# Patient Record
Sex: Male | Born: 1935 | Race: White | Hispanic: No | Smoking: Never smoker
Health system: Southern US, Community
[De-identification: ages and names within clinical notes are randomized; demographics above are authoritative.]

## PROBLEM LIST (undated history)

## (undated) DIAGNOSIS — I499 Cardiac arrhythmia, unspecified: Secondary | ICD-10-CM

## (undated) DIAGNOSIS — I6200 Nontraumatic subdural hemorrhage, unspecified: Secondary | ICD-10-CM

## (undated) DIAGNOSIS — N289 Disorder of kidney and ureter, unspecified: Secondary | ICD-10-CM

## (undated) DIAGNOSIS — E039 Hypothyroidism, unspecified: Secondary | ICD-10-CM

## (undated) DIAGNOSIS — H9190 Unspecified hearing loss, unspecified ear: Secondary | ICD-10-CM

## (undated) DIAGNOSIS — H3530 Unspecified macular degeneration: Secondary | ICD-10-CM

## (undated) DIAGNOSIS — I10 Essential (primary) hypertension: Secondary | ICD-10-CM

## (undated) HISTORY — PX: BRAIN SURGERY: SHX531

## (undated) HISTORY — PX: APPENDECTOMY: SHX54

## (undated) HISTORY — PX: CHOLECYSTECTOMY: SHX55

## (undated) HISTORY — PX: OTHER SURGICAL HISTORY: SHX169

---

## 2007-02-08 ENCOUNTER — Emergency Department: Payer: Self-pay | Admitting: Internal Medicine

## 2011-05-01 ENCOUNTER — Ambulatory Visit: Payer: Self-pay | Admitting: Urology

## 2011-05-08 ENCOUNTER — Ambulatory Visit: Payer: Self-pay | Admitting: Internal Medicine

## 2011-05-08 LAB — CREATININE, SERUM
Creatinine: 1.01 mg/dL (ref 0.60–1.30)
EGFR (African American): >60
EGFR (Non-African Amer.): >60

## 2011-05-08 LAB — CBC CANCER CENTER
Basophil %: 0.9 %
Eosinophil %: 2.8 %
HGB: 14.3 g/dL (ref 13.0–18.0)
Lymphocyte #: 1.6 x10 3/mm (ref 1.0–3.6)
Lymphocyte %: 27.9 %
MCH: 30.7 pg (ref 26.0–34.0)
MCV: 91 fL (ref 80–100)
Monocyte #: 0.4 x10 3/mm (ref 0.0–0.7)
Monocyte %: 7.4 %
RBC: 4.64 10*6/uL (ref 4.40–5.90)

## 2011-05-08 LAB — HEPATIC FUNCTION PANEL A (ARMC)
SGOT(AST): 25 U/L (ref 15–37)
SGPT (ALT): 26 U/L

## 2011-05-08 LAB — APTT: Activated PTT: 42.8 secs — ABNORMAL HIGH (ref 23.6–35.9)

## 2011-05-08 LAB — PROTIME-INR: Prothrombin Time: 24.2 secs — ABNORMAL HIGH (ref 11.5–14.7)

## 2011-05-31 ENCOUNTER — Ambulatory Visit: Payer: Self-pay | Admitting: Internal Medicine

## 2011-09-06 ENCOUNTER — Ambulatory Visit: Payer: Self-pay | Admitting: Internal Medicine

## 2011-09-06 ENCOUNTER — Ambulatory Visit: Payer: Self-pay | Admitting: Urology

## 2011-09-06 LAB — CBC CANCER CENTER
Basophil #: 0.1 x10 3/mm (ref 0.0–0.1)
Basophil %: 1.4 %
Eosinophil %: 4.3 %
HCT: 44.8 % (ref 40.0–52.0)
HGB: 14.6 g/dL (ref 13.0–18.0)
Lymphocyte #: 1.7 x10 3/mm (ref 1.0–3.6)
Lymphocyte %: 34 %
MCH: 30 pg (ref 26.0–34.0)
MCHC: 32.7 g/dL (ref 32.0–36.0)
MCV: 92 fL (ref 80–100)
Monocyte #: 0.4 x10 3/mm (ref 0.2–1.0)
Monocyte %: 8.8 %
Platelet: 176 x10 3/mm (ref 150–440)
RBC: 4.88 10*6/uL (ref 4.40–5.90)
RDW: 13.6 % (ref 11.5–14.5)

## 2011-09-06 LAB — HEPATIC FUNCTION PANEL A (ARMC)
Alkaline Phosphatase: 85 U/L (ref 50–136)
Bilirubin,Total: 0.6 mg/dL (ref 0.2–1.0)
SGOT(AST): 21 U/L (ref 15–37)
Total Protein: 6.6 g/dL (ref 6.4–8.2)

## 2011-09-06 LAB — CREATININE, SERUM
Creatinine: 1.01 mg/dL (ref 0.60–1.30)
EGFR (African American): >60

## 2011-09-30 ENCOUNTER — Ambulatory Visit: Payer: Self-pay | Admitting: Internal Medicine

## 2011-10-15 ENCOUNTER — Ambulatory Visit: Payer: Self-pay | Admitting: Urology

## 2011-10-15 LAB — BASIC METABOLIC PANEL
Anion Gap: 8 (ref 7–16)
Calcium, Total: 8.4 mg/dL — ABNORMAL LOW (ref 8.5–10.1)
Co2: 27 mmol/L (ref 21–32)
Creatinine: 0.75 mg/dL (ref 0.60–1.30)
EGFR (African American): >60
EGFR (Non-African Amer.): >60
Glucose: 100 mg/dL — ABNORMAL HIGH (ref 65–99)
Osmolality: 283 (ref 275–301)
Sodium: 141 mmol/L (ref 136–145)

## 2011-10-22 ENCOUNTER — Ambulatory Visit: Payer: Self-pay | Admitting: Urology

## 2011-10-22 LAB — PROTIME-INR: Prothrombin Time: 13.8 secs (ref 11.5–14.7)

## 2011-10-23 LAB — PATHOLOGY REPORT

## 2012-03-09 ENCOUNTER — Ambulatory Visit: Payer: Self-pay | Admitting: Urology

## 2012-03-13 ENCOUNTER — Emergency Department: Payer: Self-pay | Admitting: Emergency Medicine

## 2012-03-13 LAB — URINALYSIS, COMPLETE
Bilirubin,UR: NEGATIVE
Ketone: NEGATIVE
Ph: 6 (ref 4.5–8.0)
Protein: NEGATIVE
RBC,UR: 5 /HPF (ref 0–5)
Specific Gravity: 1.019 (ref 1.003–1.030)
WBC UR: 7 /HPF (ref 0–5)

## 2012-03-13 LAB — COMPREHENSIVE METABOLIC PANEL
Albumin: 3.6 g/dL (ref 3.4–5.0)
Anion Gap: 7 (ref 7–16)
Bilirubin,Total: 0.8 mg/dL (ref 0.2–1.0)
Calcium, Total: 8.5 mg/dL (ref 8.5–10.1)
Chloride: 104 mmol/L (ref 98–107)
Co2: 27 mmol/L (ref 21–32)
Creatinine: 1.12 mg/dL (ref 0.60–1.30)
Glucose: 137 mg/dL — ABNORMAL HIGH (ref 65–99)
Osmolality: 281 (ref 275–301)
Potassium: 4.1 mmol/L (ref 3.5–5.1)
Sodium: 138 mmol/L (ref 136–145)

## 2012-03-13 LAB — CK TOTAL AND CKMB (NOT AT ARMC)
CK, Total: 91 U/L (ref 35–232)
CK-MB: 1.8 ng/mL (ref 0.5–3.6)

## 2012-03-13 LAB — CBC
HCT: 43.2 % (ref 40.0–52.0)
HGB: 14.6 g/dL (ref 13.0–18.0)
MCH: 30.8 pg (ref 26.0–34.0)
Platelet: 196 10*3/uL (ref 150–440)
WBC: 8 10*3/uL (ref 3.8–10.6)

## 2012-03-13 LAB — DRUG SCREEN, URINE
Amphetamines, Ur Screen: NEGATIVE (ref ?–1000)
Barbiturates, Ur Screen: NEGATIVE (ref ?–200)
MDMA (Ecstasy)Ur Screen: NEGATIVE (ref ?–500)
Opiate, Ur Screen: NEGATIVE (ref ?–300)
Phencyclidine (PCP) Ur S: NEGATIVE (ref ?–25)
Tricyclic, Ur Screen: NEGATIVE (ref ?–1000)

## 2012-03-13 LAB — PROTIME-INR
INR: 3.2
Prothrombin Time: 32.5 secs — ABNORMAL HIGH (ref 11.5–14.7)

## 2012-07-01 ENCOUNTER — Ambulatory Visit: Payer: Self-pay | Admitting: Internal Medicine

## 2013-03-14 ENCOUNTER — Emergency Department: Payer: Self-pay | Admitting: Emergency Medicine

## 2013-03-14 LAB — COMPREHENSIVE METABOLIC PANEL
Albumin: 3.4 g/dL (ref 3.4–5.0)
Alkaline Phosphatase: 90 U/L
Anion Gap: 6 — ABNORMAL LOW (ref 7–16)
BUN: 21 mg/dL — ABNORMAL HIGH (ref 7–18)
Bilirubin,Total: 0.4 mg/dL (ref 0.2–1.0)
Calcium, Total: 8.5 mg/dL (ref 8.5–10.1)
Chloride: 108 mmol/L — ABNORMAL HIGH (ref 98–107)
Co2: 24 mmol/L (ref 21–32)
Creatinine: 0.95 mg/dL (ref 0.60–1.30)
EGFR (African American): >60
Glucose: 133 mg/dL — ABNORMAL HIGH (ref 65–99)
Osmolality: 281 (ref 275–301)
SGOT(AST): 22 U/L (ref 15–37)
SGPT (ALT): 28 U/L (ref 12–78)
Total Protein: 6.4 g/dL (ref 6.4–8.2)

## 2013-03-14 LAB — CBC
MCHC: 33 g/dL (ref 32.0–36.0)
RBC: 4.7 10*6/uL (ref 4.40–5.90)
RDW: 14 % (ref 11.5–14.5)
WBC: 6.2 10*3/uL (ref 3.8–10.6)

## 2013-03-14 LAB — TROPONIN I
Troponin-I: <0.02 ng/mL
Troponin-I: <0.02 ng/mL

## 2013-03-22 ENCOUNTER — Ambulatory Visit: Payer: Self-pay | Admitting: Internal Medicine

## 2014-07-19 NOTE — Op Note (Signed)
PATIENT NAME:  Jeffrey Ashley, Jeffrey MR#:  [id number] DATE OF BIRTH:  May 18, 1926  DATE OF PROCEDURE:  10/22/2011  PREOPERATIVE DIAGNOSIS: Left renal mass.   POSTOPERATIVE DIAGNOSIS: Left renal mass.   PROCEDURE: Left CT-guided percutaneous renal cryotherapy and biopsy.   SURGEON: Madolyn FriezeBrian S. Achilles Dunkope, MD  ANESTHESIA: General endotracheal anesthesia.   INDICATIONS: The patient is an 81 year old gentleman who has been found to have enlarging left renal masses. He has two masses in close proximity in the posterior aspect of the mid left kidney. The largest is approximately 2.5 cm, the second is approximately 1.5 cm in close proximity. He presents for biopsy and cryoablation.   DESCRIPTION OF PROCEDURE: After informed consent was obtained, the patient was taken to the CT suite and placed in the prone position on the CT gantry under general endotracheal anesthesia. The patient was then prepped and draped in the usual standard fashion. Initial markings were made on the overlying skin at the approximate level of the tumor. A small skin incision was made. A sheath was passed through the skin to the level of the tumor. Biopsies were then obtained utilizing 16-gauge biopsy gun. Two good core biopsies were obtained. These were sent to pathology for further evaluation. No significant bleeding was encountered. The sheath was removed. The first cryoablation 2.4 mm needle was inserted through the same site. It was advanced into the central aspect of the larger tumor. A second needle was then placed just lateral. This was advanced into the region of the second tumor. It was placed to the base of each tumor right to the collecting system fat. Freeze was then undertaken. This demonstrated good coverage of both lesions. After completion of the first freeze at 10 minutes a good cryoablation zone was appreciated. An initial scan was taken utilizing 50 mL of contrast. This demonstrated good coverage of the tumor region. The lesion was  then thawed in the standard fashion for approximately eight minutes. A second freeze was then undertaken at 10 minutes. Revisualization demonstrated good coverage of the entire region. The needles were then thawed for approximately four minutes. They were then removed. A Telfa and Tegaderm dressing was applied to the overlying skin. The patient was returned to the supine position. He was awakened from general endotracheal anesthesia. He was taken to the recovery room in stable condition. There were no problems or complications. The patient tolerated the procedure well.   ESTIMATED BLOOD LOSS: Minimal.   ____________________________ Madolyn FriezeBrian S. Achilles Dunkope, MD bsc:cms D: 10/22/2011 21:06:09 ET T: 10/23/2011 09:43:42 ET JOB#: [id number]  cc: Madolyn FriezeBrian S. Achilles Dunkope, MD, <Dictator> Madolyn FriezeBRIAN S Lari Linson MD ELECTRONICALLY SIGNED 10/25/2011 17:13

## 2016-07-07 ENCOUNTER — Emergency Department: Payer: Medicare Other

## 2016-07-07 ENCOUNTER — Encounter: Payer: Self-pay | Admitting: Emergency Medicine

## 2016-07-07 ENCOUNTER — Emergency Department
Admission: EM | Admit: 2016-07-07 | Discharge: 2016-07-07 | Disposition: A | Discharge status: Other Acute Inpatient Hospital | Payer: Medicare Other | Attending: Emergency Medicine | Admitting: Emergency Medicine

## 2016-07-07 ENCOUNTER — Ambulatory Visit (HOSPITAL_COMMUNITY)
Admission: AD | Admit: 2016-07-07 | Discharge: 2016-07-07 | Disposition: A | Discharge status: Home / Self Care | Payer: Medicare Other | Source: Other Acute Inpatient Hospital | Attending: Emergency Medicine | Admitting: Emergency Medicine

## 2016-07-07 DIAGNOSIS — R109 Unspecified abdominal pain: Secondary | ICD-10-CM

## 2016-07-07 DIAGNOSIS — Z79899 Other long term (current) drug therapy: Secondary | ICD-10-CM | POA: Insufficient documentation

## 2016-07-07 DIAGNOSIS — K8050 Calculus of bile duct without cholangitis or cholecystitis without obstruction: Secondary | ICD-10-CM

## 2016-07-07 DIAGNOSIS — R112 Nausea with vomiting, unspecified: Secondary | ICD-10-CM | POA: Insufficient documentation

## 2016-07-07 DIAGNOSIS — H538 Other visual disturbances: Secondary | ICD-10-CM | POA: Diagnosis not present

## 2016-07-07 DIAGNOSIS — I10 Essential (primary) hypertension: Secondary | ICD-10-CM | POA: Diagnosis not present

## 2016-07-07 DIAGNOSIS — Z87891 Personal history of nicotine dependence: Secondary | ICD-10-CM | POA: Insufficient documentation

## 2016-07-07 HISTORY — DX: Essential (primary) hypertension: I10

## 2016-07-07 HISTORY — DX: Unspecified macular degeneration: H35.30

## 2016-07-07 HISTORY — DX: Disorder of kidney and ureter, unspecified: N28.9

## 2016-07-07 HISTORY — DX: Nontraumatic subdural hemorrhage, unspecified: I62.00

## 2016-07-07 LAB — COMPREHENSIVE METABOLIC PANEL
ALBUMIN: 3.9 g/dL (ref 3.5–5.0)
ALK PHOS: 181 U/L — AB (ref 38–126)
ALT: 465 U/L — ABNORMAL HIGH (ref 17–63)
AST: 265 U/L — AB (ref 15–41)
Anion gap: 8 (ref 5–15)
BUN: 18 mg/dL (ref 6–20)
CALCIUM: 9.1 mg/dL (ref 8.9–10.3)
CHLORIDE: 102 mmol/L (ref 101–111)
CO2: 26 mmol/L (ref 22–32)
CREATININE: 0.74 mg/dL (ref 0.61–1.24)
GFR calc non Af Amer: >60 mL/min (ref >60)
GLUCOSE: 140 mg/dL — AB (ref 65–99)
Potassium: 4.3 mmol/L (ref 3.5–5.1)
SODIUM: 136 mmol/L (ref 135–145)
Total Bilirubin: 7.9 mg/dL — ABNORMAL HIGH (ref 0.3–1.2)
Total Protein: 6.6 g/dL (ref 6.5–8.1)

## 2016-07-07 LAB — URINALYSIS, ROUTINE W REFLEX MICROSCOPIC
BACTERIA UA: NONE SEEN
GLUCOSE, UA: NEGATIVE mg/dL
Hgb urine dipstick: NEGATIVE
KETONES UR: 20 mg/dL — AB
Leukocytes, UA: NEGATIVE
Nitrite: NEGATIVE
PROTEIN: 30 mg/dL — AB
Specific Gravity, Urine: 1.025 (ref 1.005–1.030)
pH: 5 (ref 5.0–8.0)

## 2016-07-07 LAB — CBC
HCT: 46.2 % (ref 40.0–52.0)
HEMOGLOBIN: 15.4 g/dL (ref 13.0–18.0)
MCH: 30.8 pg (ref 26.0–34.0)
MCHC: 33.3 g/dL (ref 32.0–36.0)
MCV: 92.4 fL (ref 80.0–100.0)
PLATELETS: 186 10*3/uL (ref 150–440)
RBC: 5 MIL/uL (ref 4.40–5.90)
RDW: 14.7 % — ABNORMAL HIGH (ref 11.5–14.5)
WBC: 10.1 10*3/uL (ref 3.8–10.6)

## 2016-07-07 LAB — LIPASE, BLOOD: Lipase: 2859 U/L — ABNORMAL HIGH (ref 11–51)

## 2016-07-07 MED ORDER — ONDANSETRON HCL 4 MG/2ML IJ SOLN
4.0000 mg | Freq: Once | INTRAMUSCULAR | Status: AC
  Administered 2016-07-07: 4 mg via INTRAVENOUS

## 2016-07-07 MED ORDER — MORPHINE SULFATE (PF) 2 MG/ML IV SOLN
INTRAVENOUS | Status: AC
  Administered 2016-07-07: 2 mg via INTRAVENOUS
  Filled 2016-07-07: qty 1

## 2016-07-07 MED ORDER — MORPHINE SULFATE (PF) 2 MG/ML IV SOLN
2.0000 mg | Freq: Once | INTRAVENOUS | Status: AC
  Administered 2016-07-07: 2 mg via INTRAVENOUS

## 2016-07-07 MED ORDER — DILTIAZEM HCL 25 MG/5ML IV SOLN
5.0000 mg | Freq: Once | INTRAVENOUS | Status: AC
  Administered 2016-07-07: 5 mg via INTRAVENOUS
  Filled 2016-07-07: qty 5

## 2016-07-07 MED ORDER — SODIUM CHLORIDE 0.9 % IV BOLUS (SEPSIS)
1000.0000 mL | Freq: Once | INTRAVENOUS | Status: AC
  Administered 2016-07-07: 1000 mL via INTRAVENOUS

## 2016-07-07 MED ORDER — MORPHINE SULFATE (PF) 2 MG/ML IV SOLN
2.0000 mg | Freq: Once | INTRAVENOUS | Status: AC
  Administered 2016-07-07: 2 mg via INTRAVENOUS
  Filled 2016-07-07: qty 1

## 2016-07-07 MED ORDER — ONDANSETRON HCL 4 MG/2ML IJ SOLN
4.0000 mg | Freq: Once | INTRAMUSCULAR | Status: AC
  Administered 2016-07-07: 4 mg via INTRAVENOUS
  Filled 2016-07-07: qty 2

## 2016-07-07 MED ORDER — ONDANSETRON HCL 4 MG/2ML IJ SOLN
INTRAMUSCULAR | Status: AC
  Administered 2016-07-07: 4 mg via INTRAVENOUS
  Filled 2016-07-07: qty 2

## 2016-07-07 NOTE — ED Provider Notes (Signed)
Vitals:   07/07/16 1800 07/07/16 1943  BP: (!) 159/104 (!) 177/113  Pulse: (!) 112 (!) 105  Resp: 16 18  Temp:      Patient resting comfortably in no distress. Awaiting ALS transfer to [facility] for concerns of choledocholithiasis with associated acute pancreatitis.   Sharyn Creamer, MD 07/07/16 2123

## 2016-07-07 NOTE — ED Notes (Signed)
Report given to [facility], RN with [facility] for transfer.

## 2016-07-07 NOTE — ED Provider Notes (Signed)
Patient heart rate irregular, presently 105-120. Reports he has a history of A. fib and has had use diltiazem for this in the past. He is currently nothing by mouth. Also reports mild recurrence of pain in the epigastrium, and nausea. We'll give morphine, small dose diltiazem for rate control. Rechecking with [facility] to see if transport will be available soon as [employer] overwhelmed present time unable provide a transport. Patient and family updated.   Sharyn Creamer, MD 07/07/16 2202

## 2016-07-07 NOTE — ED Notes (Signed)
The EKG was completed, signed by the Dr., and exported into the system.

## 2016-07-07 NOTE — ED Provider Notes (Signed)
[facility] Emergency Department Provider Note  Time seen: 1:33 PM  I have reviewed the triage vital signs and the nursing notes.   HISTORY  Chief Complaint Blurred Vision; Dizziness; Nausea; Emesis; and Diarrhea    HPI Jeffrey Ashley is a 83 y.o. male with a past medical history macular degeneration, hypertension, presents to the emergency department with dizziness nausea vomiting and diarrhea with abdominal discomfort.According to the patient for the past 4 days he has been having some vague abdominal discomfort which she describes as a nausea feeling in his abdomen. Patient states yesterday he had a few episodes of vomiting. Over the course of the past 2 or 3 days he has had several episodes of diarrhea. Patient states he has been feeling dizzy and lightheaded over the past several days as well. Denies any fever, chest pain, shortness of breath, cough or congestion. Denies dysuria.  Past Medical History:  Diagnosis Date  . 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 Hypertension   . Nontraumatic subdural hematoma (HCC)   . 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   Past Surgical History:  Procedure Laterality Date  . APPENDECTOMY    . Kidney Tumor Removal      Prior to Admission medications   Medication Sig Start Date End Date Taking? Authorizing Provider  acetaminophen (TYLENOL) 500 MG tablet Take 500 mg by mouth every 6 (six) hours as needed.   Yes Historical Provider, MD  buPROPion (WELLBUTRIN XL) 300 MG 24 hr tablet Take 300 mg by mouth daily.   Yes Historical Provider, MD  carvedilol (COREG) 6.25 MG tablet Take 6.25 mg by mouth 2 (two) times daily. 06/13/16  Yes Historical Provider, MD  cyanocobalamin 1000 MCG tablet Take 1,000 mcg by mouth daily.   Yes Historical Provider, MD  diltiazem (CARDIZEM CD) 120 MG 24 hr capsule Take 120 mg by mouth daily. 06/17/16  Yes Historical Provider, MD   gabapentin (NEURONTIN) 300 MG capsule Take 300 mg by mouth daily. 06/20/16  Yes Historical Provider, MD  latanoprost (XALATAN) 0.005 % ophthalmic solution Apply 1 drop to eye at bedtime.   Yes Historical Provider, MD  levothyroxine (SYNTHROID, LEVOTHROID) 75 MCG tablet Take 75 mcg by mouth daily. 07/01/16  Yes Historical Provider, MD  Vitamin D, Ergocalciferol, (DRISDOL) 50000 units CAPS capsule Take 50,000 Units by mouth every 7 (seven) days.   Yes Historical Provider, MD    No Known Allergies  No family history on file.  Social History Social History  Substance Use Topics  . Smoking status: Former Games developer  . Smokeless tobacco: Never Used  . Alcohol use No    Review of Systems Constitutional: Negative for fever. Cardiovascular: Negative for chest pain. Respiratory: Negative for shortness of breath. Gastrointestinal: Mild abdominal discomfort. Positive for nausea and vomiting. Positive for diarrhea Genitourinary: Negative for dysuria Neurological: Negative for headache 10-point ROS otherwise negative.  ____________________________________________   PHYSICAL EXAM:  VITAL SIGNS: ED Triage Vitals  Enc Vitals Group     BP 07/07/16 1230 (!) 171/100     Pulse Rate 07/07/16 1230 (!) 106     Resp 07/07/16 1230 20     Temp 07/07/16 1230 97.2 F (36.2 C)     Temp Source 07/07/16 1230 Oral     SpO2 07/07/16 1230 100 %     Weight 07/07/16 1225 186 lb (84.4 kg)     Height 07/07/16  1225 6' (1.829 m)     Head Circumference --      Peak Flow --      Pain Score 07/07/16 1225 6     Pain Loc --      Pain Edu? --      Excl. in GC? --     Constitutional: Alert and oriented. Well appearing and in no distress. Eyes: Normal exam ENT   Head: Normocephalic and atraumatic.   Mouth/Throat: Mucous membranes are moist. Cardiovascular: Normal rate, regular rhythm.  Respiratory: Normal respiratory effort without tachypnea nor retractions. Breath sounds are clear  Gastrointestinal:  Soft, minimal diffuse tenderness to palpation without rebound or guarding. No distention. Tympanic of her abdominal percussion with dull lower abdominal percussion. Musculoskeletal: Nontender with normal range of motion in all extremities.  Neurologic:  Normal speech and language. No gross focal neurologic deficits Skin:  Skin is warm, dry and intact.  Psychiatric: Mood and affect are normal.  ____________________________________________    EKG  EKG reviewed and interpreted vomit so shows atrial fibrillation 98 bpm, narrow QRS, normal axis, normal intervals, nonspecific ST changes without ST elevation.  ____________________________________________    RADIOLOGY  X-ray negative  ____________________________________________   INITIAL IMPRESSION / ASSESSMENT AND PLAN / ED COURSE  Pertinent labs & imaging results that were available during my care of the patient were reviewed by me and considered in my medical decision making (see chart for details).  Patient presents to the emergency department with vague symptoms over the past 3 or 4 days of nausea, some dizziness occasionally episodes of vomiting and diarrhea. Overall the patient appears well currently. Speaks well, oriented. States it vague abdominal discomfort which she describes as a nausea sensation. He has been having vomiting and diarrhea and feels lightheaded/dizzy. Patient does have chronic atrial fibrillation not on a blood thinner due to fall risk and a prior subdural hematoma. Patient states 2-3 weeks ago he appeared to suffer a mini stroke in which she could not use his left leg temporarily. States later that day off his symptoms resolved and have not recurred since. I discussed the possibility of a CVA causing today's symptoms, he understands the risks but does not want a large workup. We will check labs, urine, IV hydrate and closely monitor.  Patient's labs show significant elevation of lipase 2800 as well as significantly  elevated LFTs with a total bilirubin of 7.9. Labs are concerning for possible choledocholithiasis.  I discussed the patient with GI medicine. Does not have the ability to do an ERCP over the weekend and given the abdominal pain they recommend transfer to a tertiary center. I discussed this with the patient and family as well as the patient's son over the phone. They would highly prefer transfer to [facility]. We will discuss with [facility] to arrange the transfer.  Patient except at [facility] for further treatment.  ____________________________________________   FINAL CLINICAL IMPRESSION(S) / ED DIAGNOSES  Abdominal pain Nausea vomiting Choledocholithiasis   Minna Antis, MD 07/08/16 2329

## 2016-07-07 NOTE — ED Triage Notes (Signed)
Pt presents to ED via POV with his niece. Per niece pt is a resident at [facility], pt was seen today at [facility] for N/V/D and possible dehydration, pt was given 1L of fluids at [facility]. Pt states that N/V/D started on Thursday, c/o dizziness and blurred vision since Friday. Pt's niece reports approx 2 weeks ago had TIA like symptoms but was not evaluated for it. Pt is alert and oriented at this time.

## 2016-07-16 DIAGNOSIS — G629 Polyneuropathy, unspecified: Secondary | ICD-10-CM

## 2016-07-16 DIAGNOSIS — K861 Other chronic pancreatitis: Secondary | ICD-10-CM | POA: Diagnosis not present

## 2016-07-16 DIAGNOSIS — I10 Essential (primary) hypertension: Secondary | ICD-10-CM

## 2016-07-16 DIAGNOSIS — I480 Paroxysmal atrial fibrillation: Secondary | ICD-10-CM | POA: Diagnosis not present

## 2016-07-16 DIAGNOSIS — K260 Acute duodenal ulcer with hemorrhage: Secondary | ICD-10-CM

## 2016-07-16 DIAGNOSIS — K851 Biliary acute pancreatitis without necrosis or infection: Secondary | ICD-10-CM | POA: Diagnosis not present

## 2016-10-22 ENCOUNTER — Other Ambulatory Visit: Payer: Self-pay | Admitting: Internal Medicine

## 2016-10-22 DIAGNOSIS — R1031 Right lower quadrant pain: Secondary | ICD-10-CM

## 2016-10-24 ENCOUNTER — Ambulatory Visit
Admission: RE | Admit: 2016-10-24 | Discharge: 2016-10-24 | Disposition: A | Discharge status: Home / Self Care | Payer: Medicare Other | Source: Ambulatory Visit | Attending: Internal Medicine | Admitting: Internal Medicine

## 2016-10-24 ENCOUNTER — Other Ambulatory Visit: Payer: Self-pay | Admitting: Internal Medicine

## 2016-10-24 DIAGNOSIS — R319 Hematuria, unspecified: Secondary | ICD-10-CM | POA: Diagnosis not present

## 2016-10-24 DIAGNOSIS — Z9049 Acquired absence of other specified parts of digestive tract: Secondary | ICD-10-CM | POA: Insufficient documentation

## 2016-10-24 DIAGNOSIS — R1011 Right upper quadrant pain: Secondary | ICD-10-CM | POA: Insufficient documentation

## 2016-10-24 DIAGNOSIS — R1031 Right lower quadrant pain: Secondary | ICD-10-CM

## 2016-10-31 ENCOUNTER — Other Ambulatory Visit: Payer: Self-pay | Admitting: Internal Medicine

## 2016-10-31 DIAGNOSIS — N2889 Other specified disorders of kidney and ureter: Secondary | ICD-10-CM

## 2016-11-04 ENCOUNTER — Other Ambulatory Visit: Payer: Self-pay | Admitting: Internal Medicine

## 2016-11-04 DIAGNOSIS — N2889 Other specified disorders of kidney and ureter: Secondary | ICD-10-CM

## 2016-11-13 ENCOUNTER — Ambulatory Visit
Admission: RE | Admit: 2016-11-13 | Discharge: 2016-11-13 | Disposition: A | Discharge status: Home / Self Care | Payer: Medicare Other | Source: Ambulatory Visit | Attending: Internal Medicine | Admitting: Internal Medicine

## 2016-11-13 DIAGNOSIS — N2889 Other specified disorders of kidney and ureter: Secondary | ICD-10-CM | POA: Insufficient documentation

## 2016-11-13 LAB — POCT I-STAT CREATININE: Creatinine, Ser: 0.9 mg/dL (ref 0.61–1.24)

## 2016-11-13 MED ORDER — GADOBENATE DIMEGLUMINE 529 MG/ML IV SOLN
17.0000 mL | Freq: Once | INTRAVENOUS | Status: AC | PRN
  Administered 2016-11-13: 17 mL via INTRAVENOUS

## 2017-02-25 ENCOUNTER — Encounter: Payer: Self-pay | Admitting: *Deleted

## 2017-03-06 ENCOUNTER — Encounter: Payer: Self-pay | Admitting: *Deleted

## 2017-03-06 ENCOUNTER — Other Ambulatory Visit: Payer: Self-pay

## 2017-03-06 ENCOUNTER — Ambulatory Visit: Payer: Medicare Other | Admitting: Anesthesiology

## 2017-03-06 ENCOUNTER — Encounter
Admission: RE | Disposition: A | Discharge status: Home / Self Care | Payer: Self-pay | Source: Ambulatory Visit | Attending: Ophthalmology

## 2017-03-06 ENCOUNTER — Ambulatory Visit
Admission: RE | Admit: 2017-03-06 | Discharge: 2017-03-06 | Disposition: A | Discharge status: Home / Self Care | Payer: Medicare Other | Source: Ambulatory Visit | Attending: Ophthalmology | Admitting: Ophthalmology

## 2017-03-06 DIAGNOSIS — Z9049 Acquired absence of other specified parts of digestive tract: Secondary | ICD-10-CM | POA: Diagnosis not present

## 2017-03-06 DIAGNOSIS — I4891 Unspecified atrial fibrillation: Secondary | ICD-10-CM | POA: Diagnosis not present

## 2017-03-06 DIAGNOSIS — I10 Essential (primary) hypertension: Secondary | ICD-10-CM | POA: Diagnosis not present

## 2017-03-06 DIAGNOSIS — Z87891 Personal history of nicotine dependence: Secondary | ICD-10-CM | POA: Diagnosis not present

## 2017-03-06 DIAGNOSIS — Z9889 Other specified postprocedural states: Secondary | ICD-10-CM | POA: Diagnosis not present

## 2017-03-06 DIAGNOSIS — Z888 Allergy status to other drugs, medicaments and biological substances status: Secondary | ICD-10-CM | POA: Diagnosis not present

## 2017-03-06 DIAGNOSIS — H2512 Age-related nuclear cataract, left eye: Secondary | ICD-10-CM | POA: Insufficient documentation

## 2017-03-06 DIAGNOSIS — H9190 Unspecified hearing loss, unspecified ear: Secondary | ICD-10-CM | POA: Insufficient documentation

## 2017-03-06 HISTORY — PX: CATARACT EXTRACTION W/PHACO: SHX586

## 2017-03-06 HISTORY — DX: Hypothyroidism, unspecified: E03.9

## 2017-03-06 HISTORY — DX: Unspecified hearing loss, unspecified ear: H91.90

## 2017-03-06 HISTORY — DX: Cardiac arrhythmia, unspecified: I49.9

## 2017-03-06 SURGERY — PHACOEMULSIFICATION, CATARACT, WITH IOL INSERTION
Anesthesia: Monitor Anesthesia Care | Site: Eye | Laterality: Left | Wound class: Clean

## 2017-03-06 MED ORDER — CARBACHOL 0.01 % IO SOLN
INTRAOCULAR | Status: DC | PRN
  Administered 2017-03-06: 0.5 mL via INTRAOCULAR

## 2017-03-06 MED ORDER — LIDOCAINE HCL (PF) 4 % IJ SOLN
INTRAOCULAR | Status: DC | PRN
  Administered 2017-03-06: 4 mL via OPHTHALMIC

## 2017-03-06 MED ORDER — BSS IO SOLN
INTRAOCULAR | Status: DC | PRN
  Administered 2017-03-06: 09:00:00 via OPHTHALMIC

## 2017-03-06 MED ORDER — POVIDONE-IODINE 5 % OP SOLN
OPHTHALMIC | Status: AC
  Filled 2017-03-06: qty 30

## 2017-03-06 MED ORDER — PROPOFOL 10 MG/ML IV BOLUS
INTRAVENOUS | Status: DC | PRN
  Administered 2017-03-06: 20 mg via INTRAVENOUS

## 2017-03-06 MED ORDER — NEOMYCIN-POLYMYXIN-DEXAMETH 3.5-10000-0.1 OP OINT
TOPICAL_OINTMENT | OPHTHALMIC | Status: DC | PRN
  Administered 2017-03-06: 1 via OPHTHALMIC

## 2017-03-06 MED ORDER — HYALURONIDASE HUMAN 150 UNIT/ML IJ SOLN
INTRAMUSCULAR | Status: AC
  Filled 2017-03-06: qty 1

## 2017-03-06 MED ORDER — SODIUM CHLORIDE 0.9 % IV SOLN
INTRAVENOUS | Status: DC | PRN
  Administered 2017-03-06: 09:00:00 via INTRAVENOUS

## 2017-03-06 MED ORDER — MOXIFLOXACIN HCL 0.5 % OP SOLN
OPHTHALMIC | Status: DC | PRN
  Administered 2017-03-06: 0.2 mL via OPHTHALMIC

## 2017-03-06 MED ORDER — ARMC OPHTHALMIC DILATING DROPS
OPHTHALMIC | Status: AC
  Filled 2017-03-06: qty 0.4

## 2017-03-06 MED ORDER — LIDOCAINE HCL (PF) 4 % IJ SOLN
INTRAMUSCULAR | Status: DC | PRN
  Administered 2017-03-06: 09:00:00

## 2017-03-06 MED ORDER — MOXIFLOXACIN HCL 0.5 % OP SOLN: 1.0000 [drp] | OPHTHALMIC | Status: DC | PRN

## 2017-03-06 MED ORDER — TRYPAN BLUE 0.06 % OP SOLN
OPHTHALMIC | Status: DC | PRN
  Administered 2017-03-06: 0.5 mL via INTRAOCULAR

## 2017-03-06 MED ORDER — POVIDONE-IODINE 5 % OP SOLN
OPHTHALMIC | Status: DC | PRN
  Administered 2017-03-06: 1 via OPHTHALMIC

## 2017-03-06 MED ORDER — FENTANYL CITRATE (PF) 100 MCG/2ML IJ SOLN
INTRAMUSCULAR | Status: AC
  Filled 2017-03-06: qty 2

## 2017-03-06 MED ORDER — SODIUM HYALURONATE 23 MG/ML IO SOLN
INTRAOCULAR | Status: DC | PRN
Start: 2017-03-06 — End: 2017-03-06
  Administered 2017-03-06: 0.6 mL via INTRAOCULAR

## 2017-03-06 MED ORDER — NEOMYCIN-POLYMYXIN-DEXAMETH 3.5-10000-0.1 OP OINT
TOPICAL_OINTMENT | OPHTHALMIC | Status: AC
  Filled 2017-03-06: qty 3.5

## 2017-03-06 MED ORDER — BUPIVACAINE HCL (PF) 0.75 % IJ SOLN
INTRAMUSCULAR | Status: AC
  Filled 2017-03-06: qty 10

## 2017-03-06 MED ORDER — LIDOCAINE HCL (PF) 4 % IJ SOLN
INTRAMUSCULAR | Status: AC
  Filled 2017-03-06: qty 5

## 2017-03-06 MED ORDER — SODIUM HYALURONATE 10 MG/ML IO SOLN
INTRAOCULAR | Status: DC | PRN
  Administered 2017-03-06: 0.55 mL via INTRAOCULAR

## 2017-03-06 MED ORDER — SODIUM HYALURONATE 23 MG/ML IO SOLN
INTRAOCULAR | Status: AC
Start: 2017-03-06 — End: ?
  Filled 2017-03-06: qty 0.6

## 2017-03-06 MED ORDER — ARMC OPHTHALMIC DILATING DROPS
1.0000 "application " | OPHTHALMIC | Status: AC
  Administered 2017-03-06 (×2): 1 via OPHTHALMIC

## 2017-03-06 MED ORDER — SODIUM CHLORIDE 0.9 % IV SOLN: INTRAVENOUS | Status: DC

## 2017-03-06 MED ORDER — FENTANYL CITRATE (PF) 100 MCG/2ML IJ SOLN
INTRAMUSCULAR | Status: DC | PRN
  Administered 2017-03-06: 25 ug via INTRAVENOUS

## 2017-03-06 MED ORDER — MOXIFLOXACIN HCL 0.5 % OP SOLN
OPHTHALMIC | Status: AC
  Filled 2017-03-06: qty 3

## 2017-03-06 MED ORDER — EPINEPHRINE PF 1 MG/ML IJ SOLN
INTRAMUSCULAR | Status: AC
  Filled 2017-03-06: qty 2

## 2017-03-06 SURGICAL SUPPLY — 20 items
BANDAGE EYE OVAL (MISCELLANEOUS) ×6 IMPLANT
CANNULA ANT/CHMB 27GA (MISCELLANEOUS) ×3 IMPLANT
DISSECTOR HYDRO NUCLEUS 50X22 (MISCELLANEOUS) ×3 IMPLANT
FILTER MILLEX .045 (MISCELLANEOUS) ×1 IMPLANT
FLTR MILLEX .045 (MISCELLANEOUS) ×3
GLOVE BIO SURGEON STRL SZ8 (GLOVE) ×3 IMPLANT
GLOVE BIOGEL M 6.5 STRL (GLOVE) ×3 IMPLANT
GLOVE SURG LX 7.5 STRW (GLOVE) ×2
GLOVE SURG LX STRL 7.5 STRW (GLOVE) ×1 IMPLANT
GOWN STRL REUS W/ TWL LRG LVL3 (GOWN DISPOSABLE) ×2 IMPLANT
GOWN STRL REUS W/TWL LRG LVL3 (GOWN DISPOSABLE) ×4
LABEL CATARACT MEDS ST (LABEL) ×3 IMPLANT
LENS IOL TECNIS ITEC 11.5 (Intraocular Lens) ×3 IMPLANT
PACK CATARACT (MISCELLANEOUS) ×3 IMPLANT
PACK CATARACT KING (MISCELLANEOUS) ×3 IMPLANT
PACK EYE AFTER SURG (MISCELLANEOUS) ×3 IMPLANT
SOL BSS BAG (MISCELLANEOUS) ×3
SOLUTION BSS BAG (MISCELLANEOUS) ×1 IMPLANT
WATER STERILE IRR 250ML POUR (IV SOLUTION) ×3 IMPLANT
WIPE NON LINTING 3.25X3.25 (MISCELLANEOUS) ×3 IMPLANT

## 2017-03-06 NOTE — Anesthesia Postprocedure Evaluation (Signed)
Anesthesia Post Note  Patient: Jeffrey Ashley  Procedure(s) Performed: CATARACT EXTRACTION PHACO AND INTRAOCULAR LENS PLACEMENT (IOC) (Left Eye)  Patient location during evaluation: Short Stay Anesthesia Type: MAC Level of consciousness: awake and awake and alert Pain management: pain level controlled Vital Signs Assessment: post-procedure vital signs reviewed and stable Respiratory status: spontaneous breathing Cardiovascular status: blood pressure returned to baseline and stable Postop Assessment: no headache Anesthetic complications: no     Last Vitals:  Vitals:   03/06/17 0724  BP: 136/66  Pulse: (!) 47  Resp: 16  Temp: (!) 36.4 C  SpO2: 99%    Last Pain:  Vitals:   03/06/17 0724  TempSrc: Oral                 Rodney Yera,  Pebble Botkin G     

## 2017-03-06 NOTE — Anesthesia Preprocedure Evaluation (Signed)
Anesthesia Evaluation  Patient identified by MRN, date of birth, ID band Patient awake    Reviewed: Allergy & Precautions, NPO status , Patient's Chart, lab work & pertinent test results  History of Anesthesia Complications Negative for: history of anesthetic complications  Airway Mallampati: II  TM Distance: >3 FB Neck ROM: Full    Dental no notable dental hx.    Pulmonary neg sleep apnea, neg COPD, former smoker,    breath sounds clear to auscultation- rhonchi (-) wheezing      Cardiovascular hypertension, Pt. 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 No date: Dysrhythmia     Comment:  AFIB No date: HOH (hard of hearing) No date: Hypertension No date: Hypothyroidism No date: Nontraumatic subdural hematoma (HCC) No date: Tumor     Comment:  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   Reproductive/Obstetrics                             Anesthesia Physical Anesthesia Plan  ASA: III  Anesthesia Plan: MAC   Post-op Pain Management:    Induction: Intravenous  PONV Risk Score and Plan: 1 and Midazolam  Airway Management Planned: Natural Airway  Additional Equipment:   Intra-op Plan:   Post-operative Plan:   Informed Consent: I have reviewed the patients History and Physical, chart, labs and discussed the procedure including the risks, benefits and alternatives for the  proposed anesthesia with the patient or authorized representative who has indicated his/her understanding and acceptance.     Plan Discussed with: CRNA and Anesthesiologist  Anesthesia Plan Comments:         Anesthesia Quick Evaluation

## 2017-03-06 NOTE — Anesthesia Postprocedure Evaluation (Signed)
Anesthesia Post Note  Patient: Jeffrey Ashley  Procedure(s) Performed: CATARACT EXTRACTION PHACO AND INTRAOCULAR LENS PLACEMENT ([place]) (Left Eye)  Patient location during evaluation: Short Stay Anesthesia Type: MAC Level of consciousness: awake and awake and alert Pain management: pain level controlled Vital Signs Assessment: post-procedure vital signs reviewed and stable Respiratory status: spontaneous breathing Cardiovascular status: blood pressure returned to baseline and stable Postop Assessment: no headache Anesthetic complications: no     Last Vitals:  Vitals:   03/06/17 0724  BP: 136/66  Pulse: (!) 47  Resp: 16  Temp: (!) 36.4 C  SpO2: 99%    Last Pain:  Vitals:   03/06/17 0724  TempSrc: Oral                 Buckner Malta

## 2017-03-06 NOTE — Op Note (Signed)
OPERATIVE NOTE  Jeffrey SchleinRobert Johnson Jr [id number] 03/06/2017   PREOPERATIVE DIAGNOSIS:  Nuclear sclerotic cataract left eye.  H25.12   POSTOPERATIVE DIAGNOSIS:    Nuclear sclerotic cataract left eye.     PROCEDURE:  Phacoemusification with posterior chamber intraocular lens placement of the left eye   LENS:   Implant Name Type Inv. Item Serial No. Manufacturer Lot No. LRB No. Used  LENS IOL DIOP 11.5 - [id number] Intraocular Lens LENS IOL DIOP 11.5 [id number] AMO  Left 1       PCB00 +11.5   ULTRASOUND TIME: 1 minutes 10 seconds.  CDE 13.39   SURGEON:  Willey BladeBradley Dailen Mcclish, MD, MPH   ANESTHESIA:  Retrobulbar block  augmented with 1% preservative-free intracameral lidocaine.  ESTIMATED BLOOD LOSS: <1 mL   COMPLICATIONS:  None.   DESCRIPTION OF PROCEDURE:  The patient was identified in the holding room and transported to the operating room and placed in the supine position under the operating microscope.   A retrobulbar block was administered in the standard fashion.  The left eye was identified as the operative eye and it was prepped and draped in the usual sterile ophthalmic fashion.   A 1.0 millimeter clear-corneal paracentesis was made at the 5:00 position. 0.5 ml of preservative-free 1% lidocaine with epinephrine was injected into the anterior chamber.  The anterior chamber was filled with Healon 5 viscoelastic.  A 2.4 millimeter keratome was used to make a near-clear corneal incision at the 2:00 position.    There was concern for a posterior capsular defect in the preoperative evaluation, so Trypan blue was instilled under an air bubble and rinsed out with BSS to stain the capsule for improved visualization.  A curvilinear capsulorrhexis was made with a cystotome and capsulorrhexis forceps.  Balanced salt solution was used to hydrodissect and hydrodelineate the nucleus.   Phacoemulsification was then used in stop and chop fashion to remove the lens nucleus and epinucleus.  The  remaining cortex was then removed using the irrigation and aspiration handpiece. Healon was then placed into the capsular bag to distend it for lens placement.  A lens was then injected into the capsular bag.  The remaining viscoelastic was aspirated.   Wounds were hydrated with balanced salt solution.  The anterior chamber was inflated to a physiologic pressure with balanced salt solution.  Intracameral vigamox 0.1 mL undiltued was injected into the eye and a drop placed onto the ocular surface.  No wound leaks were noted.   Maxitrol ointment, a patch and shield were placed on the left eye.  The patient was taken to the recovery room in stable condition without complications of anesthesia or surgery  Willey BladeBradley Alfreda Hammad 03/06/2017, 9:40 AM

## 2017-03-06 NOTE — Anesthesia Post-op Follow-up Note (Signed)
Anesthesia QCDR form completed.        

## 2017-03-06 NOTE — Transfer of Care (Signed)
Immediate Anesthesia Transfer of Care Note  Patient: Jeffrey Ashley  Procedure(s) Performed: CATARACT EXTRACTION PHACO AND INTRAOCULAR LENS PLACEMENT (IOC) (Left Eye)  Patient Location: PACU  Anesthesia Type:MAC  Level of Consciousness: awake  Airway & Oxygen Therapy: Patient Spontanous Breathing  Post-op Assessment: Report given to RN and Post -op Vital signs reviewed and stable  Post vital signs: Reviewed and stable  Last Vitals:  Vitals:   03/06/17 0724  BP: 136/66  Pulse: (!) 47  Resp: 16  Temp: (!) 36.4 C  SpO2: 99%    Last Pain:  Vitals:   03/06/17 0724  TempSrc: Oral         Complications: No apparent anesthesia complications

## 2017-03-06 NOTE — H&P (Signed)
The History and Physical notes are on paper, have been signed, and are to be scanned.   I have examined the patient and there are no changes to the H&P.   Willey BladeBradley Rayla Pember 03/06/2017 8:54 AM

## 2017-03-06 NOTE — Discharge Instructions (Signed)
Eye Surgery Discharge Instructions  Expect mild scratchy sensation or mild soreness. DO NOT RUB YOUR EYE!  The day of surgery:  Minimal physical activity, but bed rest is not required  No reading, computer work, or close hand work  No bending, lifting, or straining.  May watch TV  For 24 hours:  No driving, legal decisions, or alcoholic beverages  Safety precautions  Eat anything you prefer: It is better to start with liquids, then soup then solid foods.  _____ Eye patch should be worn until postoperative exam tomorrow.  ____ Solar shield eyeglasses should be worn for comfort in the sunlight/patch while sleeping  Resume all regular medications including aspirin or Coumadin if these were discontinued prior to surgery. You may shower, bathe, shave, or wash your hair. Tylenol may be taken for mild discomfort.  Call your doctor if you experience significant pain, nausea, or vomiting, fever > 101 or other signs of infection. [phone] or [phone] Specific instructions:  Follow-up Information    [place] CraneKing, Bradley Mark, MD Follow up.   Specialty:  Ophthalmology Why:  December 7 at 10:10 [facility] Contact information: [address] [place] [address] [phone]

## 2018-12-01 DEATH — deceased

## 2018-12-10 IMAGING — MR MR ABDOMEN WO/W CM
16 of 17 series · 43 of 48 positions shown · IV contrast (17ML MULTIHANCE)
Comparison: Ultrasound exam 10/24/2016.  CT scan 03/09/2012.

CLINICAL DATA: Renal mass.

EXAM:
MRI ABDOMEN WITHOUT AND WITH CONTRAST
TECHNIQUE: Multiplanar multisequence MR imaging of the abdomen was performed
both before and after the administration of intravenous contrast.
CONTRAST:  17mL MULTIHANCE GADOBENATE DIMEGLUMINE 529 MG/ML IV SOLN

[Series 2: cor ssfse / · coronal · 7.0mm · 1.48mm/px · 2 of 30 slices shown]
[im 1/30]
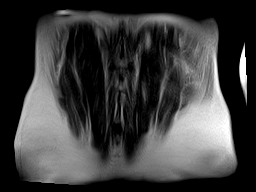
[im 30/30]
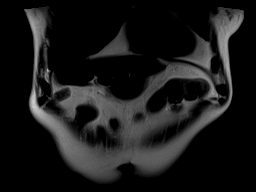

[Series 3: T2 · axial · 6.0mm · 1.48mm/px · z∈[-87,+122]mm · 2 of 30 slices shown]
[im 1/30]
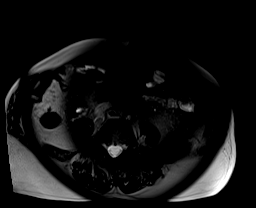
[im 30/30]
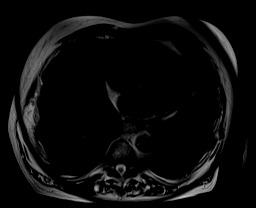

[Series 4: T1 · axial · 6.0mm · 0.74mm/px · z∈[-87,+122]mm · 4 of 60 slices shown]
[im 1/60]
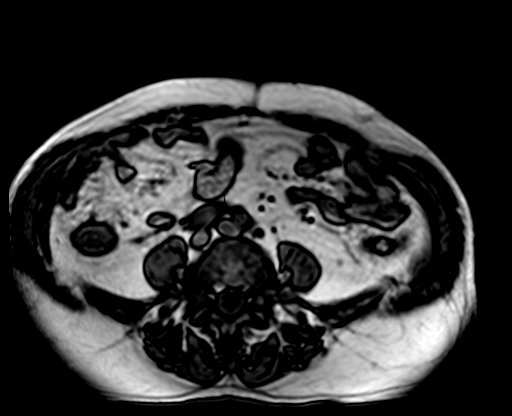
[im 20/60]
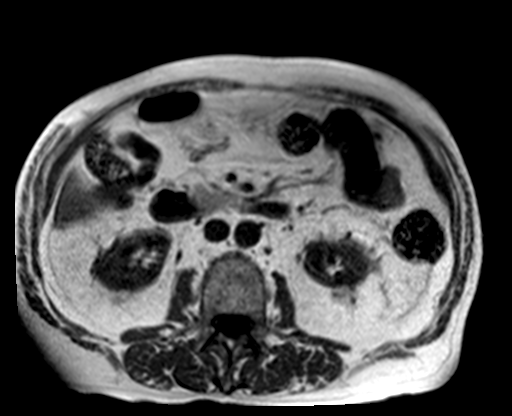
[im 40/60]
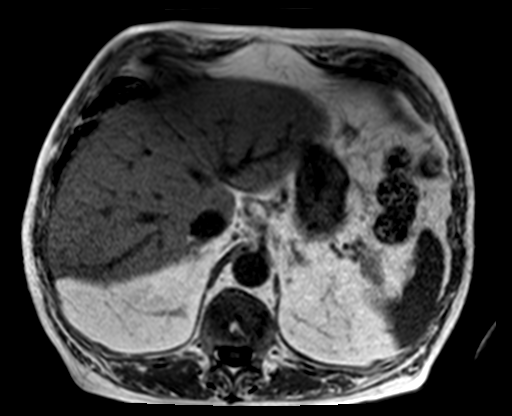
[im 60/60]
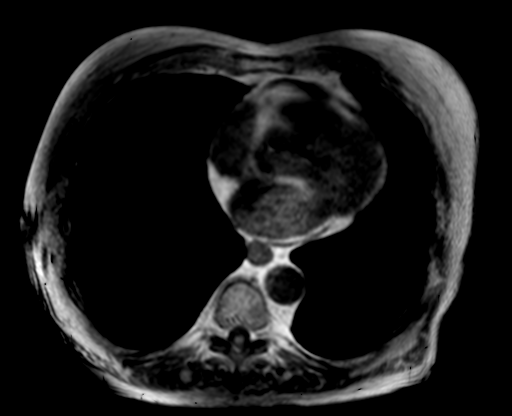

[Series 5: bSSFP · axial · 6.0mm · 0.74mm/px · z∈[-87,+122]mm · 2 of 30 slices shown]
[im 1/30]
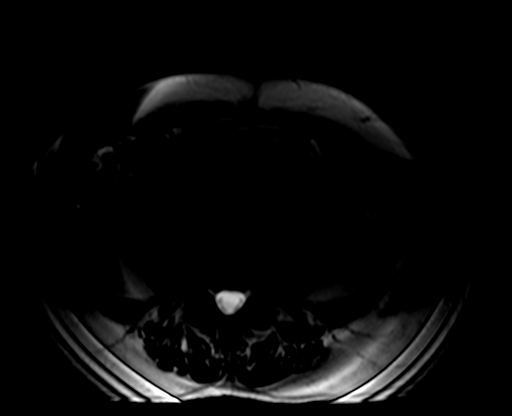
[im 30/30]
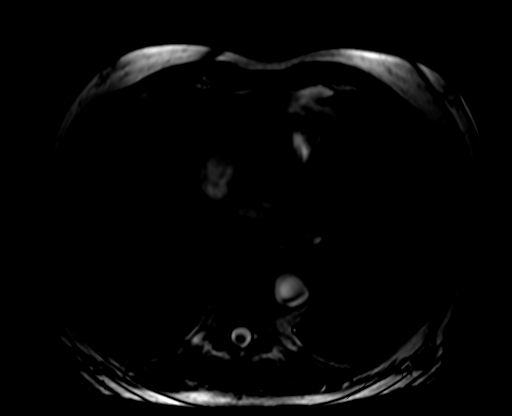

[Series 6: axial dynamic pre · axial · non-contrast · 4.0mm · 1.19mm/px · z∈[-109,+143]mm · 3 of 64 slices shown]
[im 1/64]
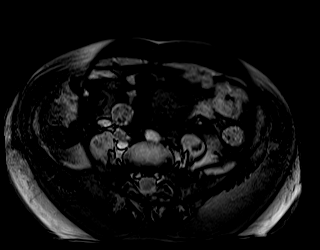
[im 32/64]
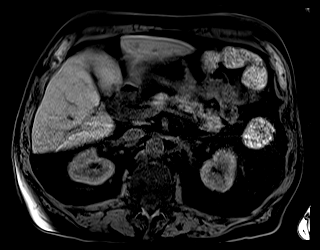
[im 64/64]
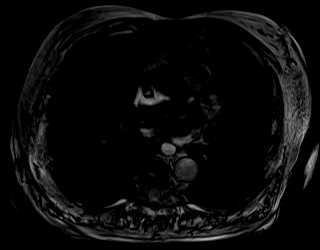

[Series 7: DWI · axial · 6.0mm · 2.00mm/px · z∈[-87,+122]mm · 4 of 90 slices shown (1 of 2)]
[im 1/90]
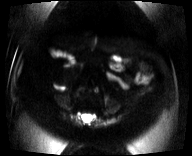
[im 30/90]
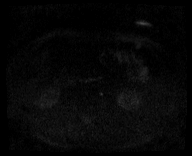
[im 60/90]
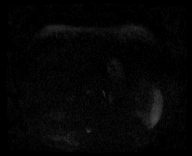
[im 90/90]
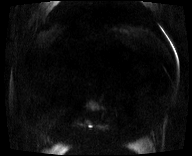

[Series 8: DWI · axial · 6.0mm · 2.00mm/px · 1 of 30 slices shown (2 of 2)]
[im 1/30]
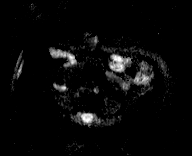

[Series 9: axial dynamic post · axial · 4.0mm · 1.19mm/px · z∈[-109,+143]mm · 3 of 64 slices shown (1 of 6)]
[im 1/64]
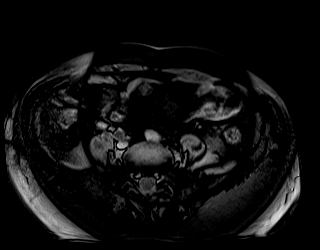
[im 32/64]
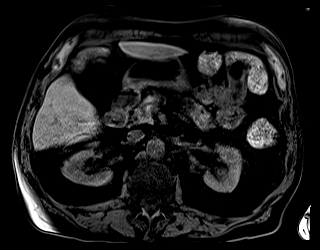
[im 64/64]
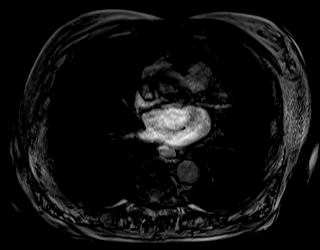

[Series 10: axial dynamic post · axial · 4.0mm · 1.19mm/px · z∈[-109,+143]mm · 3 of 64 slices shown (2 of 6)]
[im 1/64]
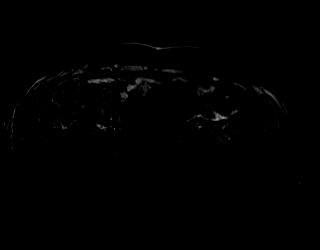
[im 32/64]
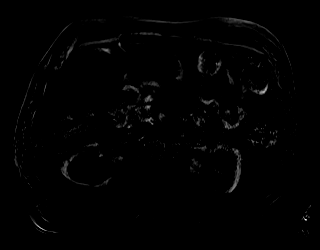
[im 64/64]
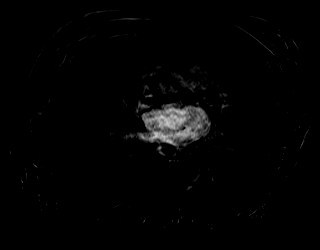

[Series 11: axial dynamic post · axial · 4.0mm · 1.19mm/px · z∈[-109,+143]mm · 3 of 64 slices shown (3 of 6)]
[im 1/64]
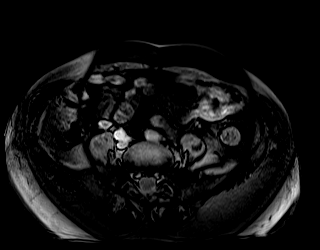
[im 32/64]
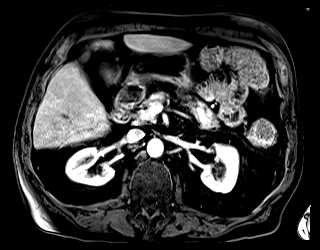
[im 64/64]
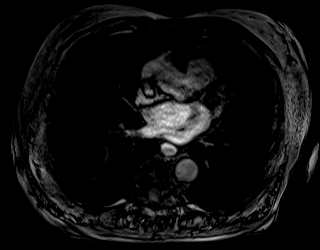

[Series 12: axial dynamic post · axial · 4.0mm · 1.19mm/px · z∈[-109,+143]mm · 3 of 64 slices shown (4 of 6)]
[im 1/64]
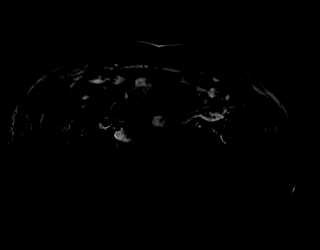
[im 32/64]
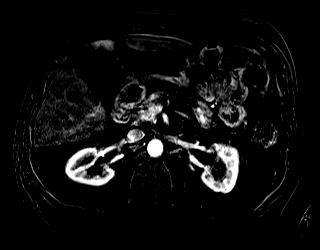
[im 64/64]
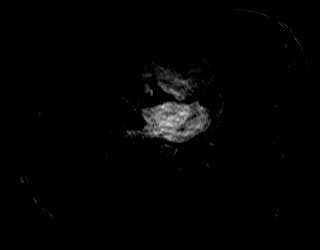

[Series 13: axial dynamic post · axial · 4.0mm · 1.19mm/px · z∈[-109,+143]mm · 3 of 64 slices shown (5 of 6)]
[im 1/64]
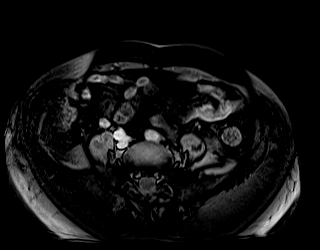
[im 32/64]
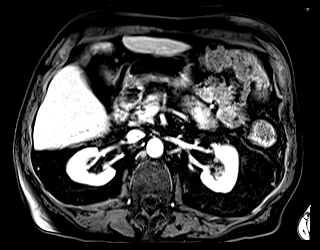
[im 64/64]
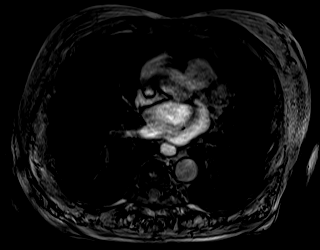

[Series 14: axial dynamic post · axial · 4.0mm · 1.19mm/px · z∈[-109,+143]mm · 3 of 64 slices shown (6 of 6)]
[im 1/64]
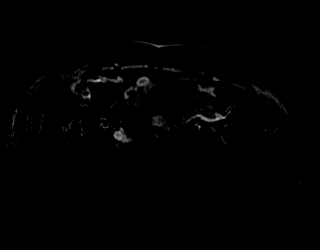
[im 32/64]
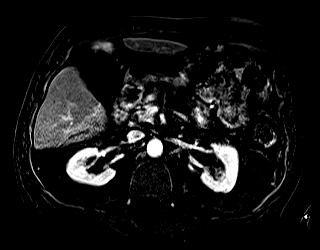
[im 64/64]
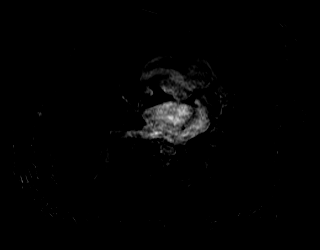

[Series 16: axial ssfse / · axial · 6.0mm · 1.19mm/px · 1 of 30 slices shown]
[im 1/30]
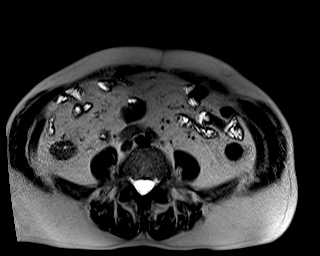

[Series 17: axial dynamic 3 · axial · 4.0mm · 1.19mm/px · z∈[-109,+143]mm · 3 of 64 slices shown (1 of 2)]
[im 1/64]
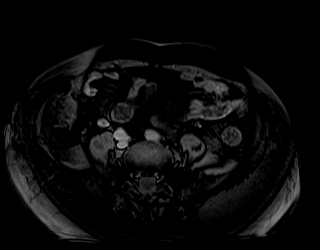
[im 32/64]
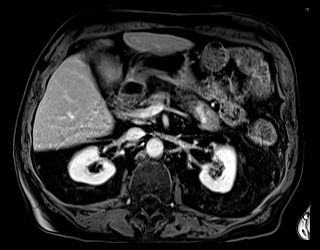
[im 64/64]
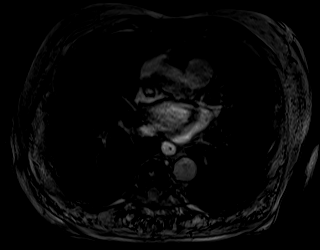

[Series 18: axial dynamic 3 · axial · 4.0mm · 1.19mm/px · z∈[-109,+143]mm · 3 of 64 slices shown (2 of 2)]
[im 1/64]
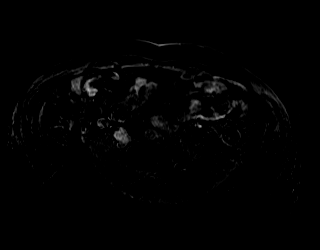
[im 32/64]
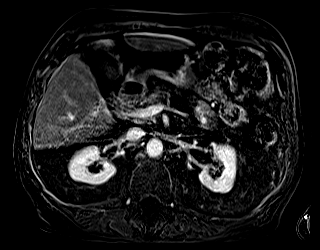
[im 64/64]
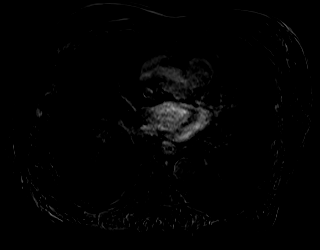

[43 of 48 positions shown; findings below may reference images not displayed]

FINDINGS: Lower chest:  Unremarkable.

Hepatobiliary: No focal mass lesion evident. Nonvisualization
gallbladder suggest prior cholecystectomy. No intrahepatic or
extrahepatic biliary dilation.

Pancreas: No focal mass lesion. No dilatation of the main duct. No
intraparenchymal cyst. No peripancreatic edema.

Spleen:  No splenomegaly. No focal mass lesion.

Adrenals/Urinary Tract: No adrenal nodule or mass. Scattered tiny
cortical cysts noted right kidney.

2.9 x 2.3 cm mixed intensity lesion identified in the posterior
aspect of the left kidney near the junction of the interpolar region
and lower pole. This lesion is associated with adjacent cortical
thinning and extends into the perinephric fat. MR imaging features
suggest that this represents a renal ablation defect. Comparing back
to a CT scan from 03/29/2012, a similar appearance was noted on that
exam and also suggested ablation defect. The area of ablation
measured 3.5 x 2.3 cm on the prior CT scan. A 12 mm area of
heterogeneous enhancement is identified in the anterior lower pole
right kidney (see image 40 series 11). This is similar to the prior
CT of 03/09/2012.

Stomach/Bowel: Stomach is nondistended. No gastric wall thickening.
No evidence of outlet obstruction. Duodenum is normally positioned
as is the ligament of Treitz. No small bowel or colonic dilatation
within the visualized abdomen.

Vascular/Lymphatic: No abdominal aortic aneurysm no evidence for
lymphadenopathy in the abdomen.

Other:  No intraperitoneal free fluid.

Musculoskeletal: No abnormal marrow signal within the visualized
bony anatomy.
IMPRESSION: 1. 2.9 cm mixed intensity lesion identified posterior left kidney,
compatible with ablation defect from prior therapy. This scar
measure slightly smaller than on the study from 03/09/2012.
2. 12 mm focus of heterogeneous cortical enhancement identified
lower pole left kidney. Although based on imaging today this lesion
is suspicious for neoplasm, it was present on the prior study and
has remained stable in the nearly 5 year interval between the 2
exams. This interval stability is reassuring. Continued
surveillance, perhaps annually, could be performed as clinically
warranted.

## 2019-08-09 ENCOUNTER — Encounter: Payer: Self-pay | Admitting: Ophthalmology

## 2019-08-09 ENCOUNTER — Other Ambulatory Visit: Payer: Self-pay

## 2019-08-12 ENCOUNTER — Other Ambulatory Visit
Admission: RE | Admit: 2019-08-12 | Discharge: 2019-08-12 | Disposition: A | Discharge status: Home / Self Care | Payer: Medicare Other | Source: Ambulatory Visit | Attending: Ophthalmology | Admitting: Ophthalmology

## 2019-08-12 ENCOUNTER — Other Ambulatory Visit: Payer: Self-pay

## 2019-08-12 DIAGNOSIS — Z01812 Encounter for preprocedural laboratory examination: Secondary | ICD-10-CM | POA: Diagnosis present

## 2019-08-12 DIAGNOSIS — Z20822 Contact with and (suspected) exposure to covid-19: Secondary | ICD-10-CM | POA: Insufficient documentation

## 2019-08-12 LAB — SARS CORONAVIRUS 2 (TAT 6-24 HRS): SARS Coronavirus 2: NEGATIVE

## 2019-08-12 NOTE — Discharge Instructions (Signed)

## 2019-08-16 ENCOUNTER — Encounter
Admission: RE | Disposition: A | Discharge status: Home / Self Care | Payer: Self-pay | Source: Ambulatory Visit | Attending: Ophthalmology

## 2019-08-16 ENCOUNTER — Encounter: Payer: Self-pay | Admitting: Ophthalmology

## 2019-08-16 ENCOUNTER — Ambulatory Visit: Payer: Medicare Other | Admitting: Anesthesiology

## 2019-08-16 ENCOUNTER — Other Ambulatory Visit: Payer: Self-pay

## 2019-08-16 ENCOUNTER — Ambulatory Visit
Admission: RE | Admit: 2019-08-16 | Discharge: 2019-08-16 | Disposition: A | Discharge status: Home / Self Care | Payer: Medicare Other | Source: Ambulatory Visit | Attending: Ophthalmology | Admitting: Ophthalmology

## 2019-08-16 HISTORY — PX: CATARACT EXTRACTION W/PHACO: SHX586

## 2019-08-16 LAB — GLUCOSE, CAPILLARY: Glucose-Capillary: 102 mg/dL — ABNORMAL HIGH (ref 70–99)

## 2019-08-16 SURGERY — PHACOEMULSIFICATION, CATARACT, WITH IOL INSERTION
Anesthesia: Monitor Anesthesia Care | Site: Eye | Laterality: Right

## 2019-08-16 MED ORDER — SODIUM HYALURONATE 23 MG/ML IO SOLN
INTRAOCULAR | Status: DC | PRN
  Administered 2019-08-16: 0.6 mL via INTRAOCULAR

## 2019-08-16 MED ORDER — EPINEPHRINE PF 1 MG/ML IJ SOLN
INTRAOCULAR | Status: DC | PRN
  Administered 2019-08-16: 122 mL via OPHTHALMIC

## 2019-08-16 MED ORDER — TETRACAINE HCL 0.5 % OP SOLN
1.0000 [drp] | OPHTHALMIC | Status: DC | PRN
  Administered 2019-08-16 (×3): 1 [drp] via OPHTHALMIC

## 2019-08-16 MED ORDER — ARMC OPHTHALMIC DILATING DROPS
1.0000 "application " | OPHTHALMIC | Status: DC | PRN
  Administered 2019-08-16 (×3): 1 via OPHTHALMIC

## 2019-08-16 MED ORDER — SODIUM HYALURONATE 10 MG/ML IO SOLN
INTRAOCULAR | Status: DC | PRN
  Administered 2019-08-16: 0.55 mL via INTRAOCULAR

## 2019-08-16 MED ORDER — MOXIFLOXACIN HCL 0.5 % OP SOLN
OPHTHALMIC | Status: DC | PRN
  Administered 2019-08-16: 0.2 mL via OPHTHALMIC

## 2019-08-16 MED ORDER — LIDOCAINE HCL (PF) 2 % IJ SOLN
INTRAOCULAR | Status: DC | PRN
  Administered 2019-08-16: 1 mL via INTRAOCULAR

## 2019-08-16 MED ORDER — FENTANYL CITRATE (PF) 100 MCG/2ML IJ SOLN
INTRAMUSCULAR | Status: DC | PRN
  Administered 2019-08-16 (×2): 25 ug via INTRAVENOUS

## 2019-08-16 SURGICAL SUPPLY — 19 items
CANNULA ANT/CHMB 27GA (MISCELLANEOUS) ×6 IMPLANT
DEVICE INJECT ISTENT W (Stent) ×1 IMPLANT
DISSECTOR HYDRO NUCLEUS 50X22 (MISCELLANEOUS) ×3 IMPLANT
GLOVE SURG LX 7.5 STRW (GLOVE) ×2
GLOVE SURG LX STRL 7.5 STRW (GLOVE) ×1 IMPLANT
GLOVE SURG SYN 8.5  E (GLOVE) ×2
GLOVE SURG SYN 8.5 E (GLOVE) ×1 IMPLANT
GOWN STRL REUS W/ TWL LRG LVL3 (GOWN DISPOSABLE) ×2 IMPLANT
GOWN STRL REUS W/TWL LRG LVL3 (GOWN DISPOSABLE) ×4
INJECT ISTENT W (Stent) ×3 IMPLANT
LENS IOL TECNIS ITEC 15.0 (Intraocular Lens) ×3 IMPLANT
MARKER SKIN DUAL TIP RULER LAB (MISCELLANEOUS) ×3 IMPLANT
PACK DR. KING ARMS (PACKS) ×3 IMPLANT
PACK EYE AFTER SURG (MISCELLANEOUS) ×3 IMPLANT
PACK OPTHALMIC (MISCELLANEOUS) ×3 IMPLANT
SYR 3ML LL SCALE MARK (SYRINGE) ×3 IMPLANT
SYR TB 1ML LUER SLIP (SYRINGE) ×3 IMPLANT
WATER STERILE IRR 250ML POUR (IV SOLUTION) ×3 IMPLANT
WIPE NON LINTING 3.25X3.25 (MISCELLANEOUS) ×3 IMPLANT

## 2019-08-16 NOTE — Anesthesia Preprocedure Evaluation (Signed)
Anesthesia Evaluation  Patient identified by MRN, date of birth, ID band Patient awake    Reviewed: Allergy & Precautions, NPO status , Patient's Chart, lab work & pertinent test results  History of Anesthesia Complications Negative for: history of anesthetic complications  Airway Mallampati: II  TM Distance: >3 FB Neck ROM: Full    Dental no notable dental hx.    Pulmonary former smoker,    Pulmonary exam normal - rhonchi (-) wheezing      Cardiovascular hypertension, Pt. on medications Normal cardiovascular exam+ dysrhythmias Atrial Fibrillation  - Systolic murmurs and - Diastolic murmurs    Neuro/Psych H/o subdural hematoma  negative psych ROS   GI/Hepatic negative GI ROS, Neg liver ROS,   Endo/Other  Hypothyroidism   Renal/GU negative Renal ROS     Musculoskeletal negative musculoskeletal ROS (+)   Abdominal Normal abdominal exam  (+) - obese,   Peds  Hematology negative hematology ROS (+)   Anesthesia Other Findings   Reproductive/Obstetrics                             Anesthesia Physical  Anesthesia Plan  ASA: III  Anesthesia Plan: MAC   Post-op Pain Management:    Induction: Intravenous  PONV Risk Score and Plan: 1 and Midazolam, TIVA and Treatment may vary due to age or medical condition  Airway Management Planned: Natural Airway and Nasal Cannula  Additional Equipment: None  Intra-op Plan:   Post-operative Plan:   Informed Consent: I have reviewed the patients History and Physical, chart, labs and discussed the procedure including the risks, benefits and alternatives for the proposed anesthesia with the patient or authorized representative who has indicated his/her understanding and acceptance.       Plan Discussed with: CRNA  Anesthesia Plan Comments:         Anesthesia Quick Evaluation

## 2019-08-16 NOTE — Transfer of Care (Signed)
Immediate Anesthesia Transfer of Care Note  Patient: Jeffrey Ashley  Procedure(s) Performed: CATARACT EXTRACTION PHACO AND INTRAOCULAR LENS PLACEMENT (IOC) RIGHT ISTENT INJ 8.84  01:03.5 (Right Eye)  Patient Location: PACU  Anesthesia Type: MAC  Level of Consciousness: awake, alert  and patient cooperative  Airway and Oxygen Therapy: Patient Spontanous Breathing   Post-op Assessment: Post-op Vital signs reviewed, Patient's Cardiovascular Status Stable, Respiratory Function Stable, Patent Airway and No signs of Nausea or vomiting  Post-op Vital Signs: Reviewed and stable  Complications: No apparent anesthesia complications

## 2019-08-16 NOTE — Anesthesia Procedure Notes (Signed)
Procedure Name: MAC Date/Time: 08/16/2019 8:59 AM Performed by: Vanetta Shawl, CRNA Pre-anesthesia Checklist: Patient identified, Emergency Drugs available, Suction available, Timeout performed and Patient being monitored Patient Re-evaluated:Patient Re-evaluated prior to induction Oxygen Delivery Method: Nasal cannula Placement Confirmation: positive ETCO2

## 2019-08-16 NOTE — H&P (Signed)

## 2019-08-16 NOTE — Op Note (Signed)
OPERATIVE NOTE  Jeffrey Ashley [id number] 08/16/2019  PREOPERATIVE DIAGNOSIS:   1.  Mild PRIMARY open angle glaucoma, right eye. H40.1111  2.  Nuclear sclerotic cataract right eye.  H25.11   POSTOPERATIVE DIAGNOSIS:    same.   PROCEDURE:   1.  Placement of trabecular bypass stent (istent). CPT 0191T  and placement of additional stent  CPT 0376T 2.  Phacoemusification with posterior chamber intraocular lens placement of the right eye  CPT [phone]   LENS: Implant Name Type Inv. Item Serial No. Manufacturer Lot No. LRB No. Used Action  Jeffrey Ashley - [id number] Stent Jeffrey Ashley [id number] [employer]  Right 1 Implanted  LENS IOL DIOP 15.0 - [id number] Intraocular Lens LENS IOL DIOP 15.0 [id number] AMO  Right 1 Implanted      Procedure(s): CATARACT EXTRACTION PHACO AND INTRAOCULAR LENS PLACEMENT (IOC) RIGHT ISTENT INJ 8.84  01:03.5 (Right)  PCB00 +15.0    ULTRASOUND TIME: 1 minutes 03 seconds.  CDE 8.84   SURGEON:  Benay Pillow, MD, MPH  ANESTHESIOLOGIST: Anesthesiologist: Ardeth Sportsman, MD CRNA: Vanetta Shawl, CRNA   ANESTHESIA:  MAC and intracameral preservative-free lidocaine 4%.  ESTIMATED BLOOD LOSS: less than 1 mL.   COMPLICATIONS:  None.   DESCRIPTION OF PROCEDURE:  The patient was identified in the holding room and transported to the operating room.   The patient was placed in the supine position under the operating microscope.  The right eye was prepped and draped in the usual sterile ophthalmic fashion.   A 1.0 millimeter clear-corneal paracentesis was made at the 10:30 position. 0.5 ml of preservative-free 1% lidocaine with epinephrine was injected into the anterior chamber.  The anterior chamber was filled with Healon 5 viscoelastic.  A 2.4 millimeter keratome was used to make a near-clear corneal incision at the 8:00 position.   Attention was turned to the istent.  The patients head was turned to the left and the microscope was tilted to  035 degrees.  Ocular instruments/Glauk[employer] OAL/H2 gonioprism was used with IPC05 (iclip) coupled with Healon 5 on the cornea was used to visualize the trabecular meshwork. The istent was opened and introduced into the eye.  The meshwork was engaged with the tip of the iStent injector and the stent was deployed into Schlemm's canal at 2:00.  The second stent was deployed at 4:00.  The stents were well seated and in good position.  Next, attention was turned to the phacoemulsification A curvilinear capsulorrhexis was made with a cystotome and capsulorrhexis forceps.  Balanced salt solution was used to hydrodissect and hydrodelineate the nucleus.   Phacoemulsification was then used in stop and chop fashion to remove the lens nucleus and epinucleus.  The remaining cortex was then removed using the irrigation and aspiration handpiece. Healon was then placed into the capsular bag to distend it for lens placement.  A lens was then injected into the capsular bag.  The remaining viscoelastic was aspirated.   Wounds were hydrated with balanced salt solution.  The anterior chamber was inflated to a physiologic pressure with balanced salt solution.   Intracameral vigamox 0.1 mL undiluted was injected into the eye and a drop placed onto the ocular surface.  There was some microhyphema (blood reflux from istent inject W's placed) at the end of the case.  No wound leaks were noted. The patient was taken to the recovery room in stable condition without complications of anesthesia or surgery   Benay Pillow 08/16/2019, 9:32 AM

## 2019-08-16 NOTE — Anesthesia Postprocedure Evaluation (Signed)
Anesthesia Post Note  Patient: Jeffrey Ashley  Procedure(s) Performed: CATARACT EXTRACTION PHACO AND INTRAOCULAR LENS PLACEMENT (IOC) RIGHT ISTENT INJ 8.84  01:03.5 (Right Eye)     Anesthesia Post Evaluation  Fletcher Anon

## 2019-08-17 ENCOUNTER — Encounter: Payer: Self-pay | Admitting: *Deleted

## 2019-08-17 ENCOUNTER — Emergency Department: Payer: Medicare Other

## 2019-08-17 ENCOUNTER — Inpatient Hospital Stay
Admission: EM | Admit: 2019-08-17 | Discharge: 2019-08-20 | DRG: 481 | Disposition: A | Discharge status: Skilled Nursing Facility | Payer: Medicare Other | Attending: Internal Medicine | Admitting: Internal Medicine

## 2019-08-17 ENCOUNTER — Other Ambulatory Visit: Payer: Self-pay

## 2019-08-17 DIAGNOSIS — Z79890 Hormone replacement therapy (postmenopausal): Secondary | ICD-10-CM

## 2019-08-17 DIAGNOSIS — Z01818 Encounter for other preprocedural examination: Secondary | ICD-10-CM

## 2019-08-17 DIAGNOSIS — S50312A Abrasion of left elbow, initial encounter: Secondary | ICD-10-CM | POA: Diagnosis present

## 2019-08-17 DIAGNOSIS — Z9849 Cataract extraction status, unspecified eye: Secondary | ICD-10-CM

## 2019-08-17 DIAGNOSIS — S72112A Displaced fracture of greater trochanter of left femur, initial encounter for closed fracture: Principal | ICD-10-CM | POA: Diagnosis present

## 2019-08-17 DIAGNOSIS — Z20822 Contact with and (suspected) exposure to covid-19: Secondary | ICD-10-CM | POA: Diagnosis present

## 2019-08-17 DIAGNOSIS — S72002A Fracture of unspecified part of neck of left femur, initial encounter for closed fracture: Secondary | ICD-10-CM | POA: Diagnosis present

## 2019-08-17 DIAGNOSIS — I482 Chronic atrial fibrillation, unspecified: Secondary | ICD-10-CM

## 2019-08-17 DIAGNOSIS — H3530 Unspecified macular degeneration: Secondary | ICD-10-CM | POA: Diagnosis present

## 2019-08-17 DIAGNOSIS — H2511 Age-related nuclear cataract, right eye: Secondary | ICD-10-CM | POA: Diagnosis present

## 2019-08-17 DIAGNOSIS — Y92010 Kitchen of single-family (private) house as the place of occurrence of the external cause: Secondary | ICD-10-CM

## 2019-08-17 DIAGNOSIS — E039 Hypothyroidism, unspecified: Secondary | ICD-10-CM

## 2019-08-17 DIAGNOSIS — Z888 Allergy status to other drugs, medicaments and biological substances status: Secondary | ICD-10-CM

## 2019-08-17 DIAGNOSIS — W0110XA Fall on same level from slipping, tripping and stumbling with subsequent striking against unspecified object, initial encounter: Secondary | ICD-10-CM | POA: Diagnosis present

## 2019-08-17 DIAGNOSIS — Z66 Do not resuscitate: Secondary | ICD-10-CM | POA: Diagnosis present

## 2019-08-17 DIAGNOSIS — Y92009 Unspecified place in unspecified non-institutional (private) residence as the place of occurrence of the external cause: Secondary | ICD-10-CM

## 2019-08-17 DIAGNOSIS — H9190 Unspecified hearing loss, unspecified ear: Secondary | ICD-10-CM | POA: Diagnosis present

## 2019-08-17 DIAGNOSIS — Z419 Encounter for procedure for purposes other than remedying health state, unspecified: Secondary | ICD-10-CM

## 2019-08-17 DIAGNOSIS — W19XXXA Unspecified fall, initial encounter: Secondary | ICD-10-CM

## 2019-08-17 DIAGNOSIS — H401111 Primary open-angle glaucoma, right eye, mild stage: Secondary | ICD-10-CM | POA: Diagnosis present

## 2019-08-17 DIAGNOSIS — I10 Essential (primary) hypertension: Secondary | ICD-10-CM | POA: Diagnosis present

## 2019-08-17 DIAGNOSIS — S7292XA Unspecified fracture of left femur, initial encounter for closed fracture: Secondary | ICD-10-CM

## 2019-08-17 DIAGNOSIS — D696 Thrombocytopenia, unspecified: Secondary | ICD-10-CM | POA: Diagnosis not present

## 2019-08-17 LAB — BASIC METABOLIC PANEL
Anion gap: 8 (ref 5–15)
BUN: 27 mg/dL — ABNORMAL HIGH (ref 8–23)
CO2: 25 mmol/L (ref 22–32)
Calcium: 8.9 mg/dL (ref 8.9–10.3)
Chloride: 103 mmol/L (ref 98–111)
Creatinine, Ser: 0.97 mg/dL (ref 0.61–1.24)
GFR calc Af Amer: >60 mL/min (ref >60)
GFR calc non Af Amer: >60 mL/min (ref >60)
Glucose, Bld: 137 mg/dL — ABNORMAL HIGH (ref 70–99)
Potassium: 4.4 mmol/L (ref 3.5–5.1)
Sodium: 136 mmol/L (ref 135–145)

## 2019-08-17 LAB — SARS CORONAVIRUS 2 BY RT PCR (HOSPITAL ORDER, PERFORMED IN ~~LOC~~ HOSPITAL LAB): SARS Coronavirus 2: NEGATIVE

## 2019-08-17 LAB — CBC WITH DIFFERENTIAL/PLATELET
Abs Immature Granulocytes: 0.11 10*3/uL — ABNORMAL HIGH (ref 0.00–0.07)
Basophils Absolute: 0.1 10*3/uL (ref 0.0–0.1)
Basophils Relative: 1 %
Eosinophils Absolute: 0.2 10*3/uL (ref 0.0–0.5)
Eosinophils Relative: 3 %
HCT: 42.3 % (ref 39.0–52.0)
Hemoglobin: 14.2 g/dL (ref 13.0–17.0)
Immature Granulocytes: 1 %
Lymphocytes Relative: 14 %
Lymphs Abs: 1.3 10*3/uL (ref 0.7–4.0)
MCH: 30.8 pg (ref 26.0–34.0)
MCHC: 33.6 g/dL (ref 30.0–36.0)
MCV: 91.8 fL (ref 80.0–100.0)
Monocytes Absolute: 0.6 10*3/uL (ref 0.1–1.0)
Monocytes Relative: 6 %
Neutro Abs: 7.1 10*3/uL (ref 1.7–7.7)
Neutrophils Relative %: 75 %
Platelets: 155 10*3/uL (ref 150–400)
RBC: 4.61 MIL/uL (ref 4.22–5.81)
RDW: 13 % (ref 11.5–15.5)
WBC: 9.4 10*3/uL (ref 4.0–10.5)
nRBC: 0 % (ref 0.0–0.2)

## 2019-08-17 MED ORDER — HYDROCODONE-ACETAMINOPHEN 5-325 MG PO TABS: 1.0000 | ORAL_TABLET | Freq: Four times a day (QID) | ORAL | Status: DC | PRN

## 2019-08-17 MED ORDER — OCUVITE-LUTEIN PO CAPS
1.0000 | ORAL_CAPSULE | Freq: Two times a day (BID) | ORAL | Status: DC
  Administered 2019-08-18 – 2019-08-20 (×4): 1 via ORAL
  Filled 2019-08-17 (×7): qty 1

## 2019-08-17 MED ORDER — LEVOTHYROXINE SODIUM 50 MCG PO TABS
50.0000 ug | ORAL_TABLET | Freq: Every day | ORAL | Status: DC
  Administered 2019-08-19 – 2019-08-20 (×2): 50 ug via ORAL
  Filled 2019-08-17 (×2): qty 1

## 2019-08-17 MED ORDER — CARVEDILOL 3.125 MG PO TABS
6.2500 mg | ORAL_TABLET | Freq: Two times a day (BID) | ORAL | Status: DC
  Administered 2019-08-18 (×2): 6.25 mg via ORAL
  Filled 2019-08-17 (×2): qty 2

## 2019-08-17 MED ORDER — LATANOPROST 0.005 % OP SOLN
1.0000 [drp] | Freq: Every day | OPHTHALMIC | Status: DC
  Filled 2019-08-17: qty 2.5

## 2019-08-17 MED ORDER — MORPHINE SULFATE (PF) 4 MG/ML IV SOLN
4.0000 mg | Freq: Once | INTRAVENOUS | Status: AC
  Administered 2019-08-17: 4 mg via INTRAVENOUS
  Filled 2019-08-17: qty 1

## 2019-08-17 MED ORDER — MORPHINE SULFATE (PF) 2 MG/ML IV SOLN
0.5000 mg | INTRAVENOUS | Status: DC | PRN
  Administered 2019-08-18: 0.5 mg via INTRAVENOUS
  Filled 2019-08-17: qty 1

## 2019-08-17 MED ORDER — DOCUSATE SODIUM 100 MG PO CAPS
100.0000 mg | ORAL_CAPSULE | Freq: Two times a day (BID) | ORAL | Status: DC
  Administered 2019-08-18 – 2019-08-20 (×4): 100 mg via ORAL
  Filled 2019-08-17 (×4): qty 1

## 2019-08-17 MED ORDER — SODIUM CHLORIDE 0.9 % IV SOLN: INTRAVENOUS | Status: DC

## 2019-08-17 MED ORDER — ENOXAPARIN SODIUM 40 MG/0.4ML ~~LOC~~ SOLN
40.0000 mg | SUBCUTANEOUS | Status: DC
  Administered 2019-08-18 – 2019-08-19 (×2): 40 mg via SUBCUTANEOUS
  Filled 2019-08-17 (×2): qty 0.4

## 2019-08-17 MED ORDER — ASPIRIN EC 81 MG PO TBEC
81.0000 mg | DELAYED_RELEASE_TABLET | Freq: Every day | ORAL | Status: DC
  Administered 2019-08-19 – 2019-08-20 (×2): 81 mg via ORAL
  Filled 2019-08-17 (×2): qty 1

## 2019-08-17 MED ORDER — TIMOLOL MALEATE 0.5 % OP SOLN
1.0000 [drp] | Freq: Every day | OPHTHALMIC | Status: DC
  Filled 2019-08-17: qty 5

## 2019-08-17 NOTE — H&P (Signed)
History and Physical    Jeffrey Ashley UGQ:[id number] DOB: [birth year] DOA: 08/17/2019  PCP: Marguarite Arbour, MD   Patient coming from: Home I have personally briefly reviewed patient's old medical records in [facility]  Chief Complaint: Fall  HPI: Jeffrey Ashley is a 86 y.o. male with medical history significant for atrial fibrillation on aspirin, hypertension, hypothyroidism and glaucoma, who is status post cataract surgery yesterday on 08/16/2019 who presents to the emergency room following a fall in his kitchen onto his left hip with inability to get up.  He did not strike his head and did not lose consciousness.  He reports having trouble getting around since his cataract surgery yesterday.  He denies preceding lightheadedness visual disturbance or one-sided weakness numbness or tingling.  Denies preceding chest pain, palpitations or shortness of breath.  Reports being in his usual state of health prior to the fall.  ED Course: Work-up in the emergency room mostly unremarkable with fair vitals and mostly normal blood work.  He had extensive imaging significant for hip x-ray that showed left proximal femur fracture.  CT head, CT neck, x-ray elbow where he had a skin tear, with no acute findings.  The emergency room provider contacted orthopedist, Dr. Odis Luster who will see patient and arrange for surgery.  Hospitalist consulted for admission.  Review of Systems: As per HPI otherwise 10 point review of systems negative.    Past Medical History:  Diagnosis Date  . 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 Dysrhythmia    AFIB  . HOH (hard of hearing)   . Hypertension   . Hypothyroidism   . Nontraumatic subdural hematoma (HCC)   . 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 APPENDECTOMY    . BRAIN SURGERY     CRANIO  . CATARACT EXTRACTION W/PHACO Left 03/06/2017   Procedure: CATARACT EXTRACTION PHACO AND INTRAOCULAR LENS  PLACEMENT (IOC);  Surgeon: Nevada Crane, MD;  Location: [facility] ORS;  Service: Ophthalmology;  Laterality: Left;  [place] 01:10.8 AP% 18.8 CDE 13.39 FLUID PACK LOT # [id number]  . CATARACT EXTRACTION W/PHACO Right 08/16/2019   Procedure: CATARACT EXTRACTION PHACO AND INTRAOCULAR LENS PLACEMENT (IOC) RIGHT ISTENT INJ 8.84  01:03.5;  Surgeon: Nevada Crane, MD;  [facility];  Service: Ophthalmology;  Laterality: Right;  . CHOLECYSTECTOMY    . Kidney Tumor Removal       reports that he has never smoked. He has never used smokeless tobacco. He reports previous alcohol use. He reports that he does not use drugs.  Allergies  Allergen Reactions  . Brimonidine Tartrate     USING EYE DROP  . Combigan [Brimonidine Tartrate-Timolol] Other (See Comments)    Unknown reaction.    History reviewed. No pertinent family history.   Prior to Admission medications   Medication Sig Start Date End Date Taking? Authorizing Provider  aspirin 81 MG EC tablet Take by mouth. 03/22/19  Yes [provider]  carvedilol (COREG) 6.25 MG tablet Take 6.25 mg by mouth 2 (two) times daily. 06/13/16  Yes [provider]  Cholecalciferol (VITAMIN D-3) 5000 units TABS Take 5,000 Units by mouth daily.   Yes [provider]  latanoprost (XALATAN) 0.005 % ophthalmic solution Apply 1 drop to eye at bedtime.   Yes [provider]  levothyroxine (SYNTHROID, LEVOTHROID) 50 MCG tablet Take 50 mcg by mouth daily before breakfast. 02/11/17  Yes [provider]  Multiple Vitamins-Minerals (PRESERVISION AREDS 2 PO) Take by mouth 2 (two) times daily.   Yes [provider]  polyethylene glycol powder (GLYCOLAX/MIRALAX) 17 GM/SCOOP powder Take by mouth. 07/11/16  Yes [provider]  timolol (TIMOPTIC) 0.5 % ophthalmic solution Place 1 drop into both eyes at bedtime.   Yes [provider]  vitamin B-12 (CYANOCOBALAMIN) 500 MCG tablet Take 500 mcg by  mouth 2 (two) times a week.   Yes [provider]    Physical Exam: Vitals:   08/17/19 1919  BP: (!) 164/103  Pulse: 95  Resp: (!) 23  Temp: 98.1 F (36.7 C)  TempSrc: Oral  SpO2: 97%  Weight: 80.3 kg  Height: 6\' 1"  (1.854 m)     Vitals:   08/17/19 1919  BP: (!) 164/103  Pulse: 95  Resp: (!) 23  Temp: 98.1 F (36.7 C)  TempSrc: Oral  SpO2: 97%  Weight: 80.3 kg  Height: 6\' 1"  (1.854 m)    Constitutional: Alert and awake, oriented x3, not in any acute distress. Eyes: PERLA, EOMI, clear eye protector on right eye ENMT: external ears and nose appear normal, normal hearing             Lips appears normal, oropharynx mucosa, tongue, posterior pharynx appear normal  Neck: neck appears normal, no masses, normal ROM, no thyromegaly, no JVD  CVS: S1-S2 clear, no murmur rubs or gallops,irregular rhythm  , no carotid bruits, pedal pulses palpable, No LE edema Respiratory:  clear to auscultation bilaterally, no wheezing, rales or rhonchi. Respiratory effort normal. No accessory muscle use.  Abdomen: soft nontender, nondistended, normal bowel sounds, no hepatosplenomegaly, no hernias Musculoskeletal: : no cyanosis, clubbing , no contractures or atrophy, shortening of left extremity Neuro: Cranial nerves II-XII intact, sensation, reflexes normal, strength Psych: judgement and insight appear normal, stable mood and affect,  Skin: no rashes or lesions or ulcers, no induration or nodules   Labs on Admission: I have personally reviewed following labs and imaging studies  CBC: Recent Labs  Lab 08/17/19 1920  WBC 9.4  NEUTROABS 7.1  HGB 14.2  HCT 42.3  MCV 91.8  PLT 155   Basic Metabolic Panel: Recent Labs  Lab 08/17/19 1920  NA 136  K 4.4  CL 103  CO2 25  GLUCOSE 137*  BUN 27*  CREATININE 0.97  CALCIUM 8.9   GFR: Estimated Creatinine Clearance: 57.2 mL/min (by C-G formula based on SCr of 0.97 mg/dL). Liver Function Tests: No results for input(s): AST,  ALT, ALKPHOS, BILITOT, PROT, ALBUMIN in the last 168 hours. No results for input(s): LIPASE, AMYLASE in the last 168 hours. No results for input(s): AMMONIA in the last 168 hours. Coagulation Profile: No results for input(s): INR, PROTIME in the last 168 hours. Cardiac Enzymes: No results for input(s): CKTOTAL, CKMB, CKMBINDEX, TROPONINI in the last 168 hours. BNP (last 3 results) No results for input(s): PROBNP in the last 8760 hours. HbA1C: No results for input(s): HGBA1C in the last 72 hours. CBG: Recent Labs  Lab 08/16/19 0935  GLUCAP 102*   Lipid Profile: No results for input(s): CHOL, HDL, LDLCALC, TRIG, CHOLHDL, LDLDIRECT in the last 72 hours. Thyroid Function Tests: No results for input(s): TSH, T4TOTAL, FREET4, T3FREE, THYROIDAB in the last 72 hours. Anemia Panel: No results for input(s): VITAMINB12, FOLATE, FERRITIN, TIBC, IRON, RETICCTPCT in the last 72 hours. Urine analysis:    Component Value Date/Time   COLORURINE AMBER (A) 07/07/2016 1335   APPEARANCEUR  CLEAR (A) 07/07/2016 1335   APPEARANCEUR Hazy 03/13/2012 1900   LABSPEC 1.025 07/07/2016 1335   LABSPEC 1.019 03/13/2012 1900   PHURINE 5.0 07/07/2016 1335   GLUCOSEU NEGATIVE 07/07/2016 1335   GLUCOSEU Negative 03/13/2012 1900   HGBUR NEGATIVE 07/07/2016 1335   BILIRUBINUR SMALL (A) 07/07/2016 1335   BILIRUBINUR Negative 03/13/2012 1900   KETONESUR 20 (A) 07/07/2016 1335   PROTEINUR 30 (A) 07/07/2016 1335   NITRITE NEGATIVE 07/07/2016 1335   LEUKOCYTESUR NEGATIVE 07/07/2016 1335   LEUKOCYTESUR 1+ 03/13/2012 1900    Radiological Exams on Admission: DG Elbow 2 Views Left  Result Date: 08/17/2019 CLINICAL DATA:  Fall EXAM: LEFT ELBOW - 2 VIEW COMPARISON:  None. FINDINGS: There is no evidence of fracture, dislocation, or joint effusion. There is no evidence of arthropathy or other focal bone abnormality. Soft tissues are unremarkable. IMPRESSION: Negative. Electronically Signed   By: Jasmine Pang M.D.   On:  08/17/2019 20:25   CT Head Wo Contrast  Result Date: 08/17/2019 CLINICAL DATA:  Status post fall. EXAM: CT HEAD WITHOUT CONTRAST TECHNIQUE: Contiguous axial images were obtained from the base of the skull through the vertex without intravenous contrast. COMPARISON:  July 07, 2016 FINDINGS: Brain: There is mild cerebral atrophy with widening of the extra-axial spaces and ventricular dilatation. There are areas of decreased attenuation within the white matter tracts of the supratentorial brain, consistent with microvascular disease changes. Vascular: No hyperdense vessel or unexpected calcification. Skull: Right parietal and right frontoparietal burr holes are seen. Sinuses/Orbits: There is marked severity bilateral ethmoid sinus and left maxillary sinus mucosal thickening. Other: None. IMPRESSION: 1. Generalized cerebral atrophy. 2. No acute intracranial abnormality. 3. Marked severity bilateral ethmoid sinus and left maxillary sinus disease. Electronically Signed   By: Aram Candela M.D.   On: 08/17/2019 20:10   CT Cervical Spine Wo Contrast  Result Date: 08/17/2019 CLINICAL DATA:  Status post fall. EXAM: CT CERVICAL SPINE WITHOUT CONTRAST TECHNIQUE: Multidetector CT imaging of the cervical spine was performed without intravenous contrast. Multiplanar CT image reconstructions were also generated. COMPARISON:  None. FINDINGS: Alignment: Normal. Skull base and vertebrae: No acute fracture. No primary bone lesion or focal pathologic process. Soft tissues and spinal canal: No prevertebral fluid or swelling. No visible canal hematoma. Disc levels: Moderate severity multilevel endplate sclerosis is seen throughout the cervical spine with moderate to marked severity multilevel intervertebral disc space narrowing. Moderate severity bilateral multilevel facet joint hypertrophy is noted. Upper chest: Negative. Other: None. IMPRESSION: 1. No acute osseous abnormality of the cervical spine. 2. Moderate to marked  severity multilevel degenerative changes. Electronically Signed   By: Aram Candela M.D.   On: 08/17/2019 20:16   DG Chest Portable 1 View  Result Date: 08/17/2019 CLINICAL DATA:  Status post fall. EXAM: PORTABLE CHEST 1 VIEW COMPARISON:  March 14, 2013 FINDINGS: There is no evidence of acute infiltrate, pleural effusion or pneumothorax. The cardiac silhouette is markedly enlarged. There is moderate severity calcification of the aortic arch. The visualized skeletal structures are unremarkable. IMPRESSION: Cardiomegaly without evidence of acute or active cardiopulmonary disease. Electronically Signed   By: Aram Candela M.D.   On: 08/17/2019 20:28   DG Hip Unilat W or Wo Pelvis 2-3 Views Left  Result Date: 08/17/2019 CLINICAL DATA:  Status post fall. EXAM: DG HIP (WITH OR WITHOUT PELVIS) 2-3V LEFT COMPARISON:  None. FINDINGS: Acute fracture is seen involving the greater trochanter of the proximal left femur. There is possible involvement of the inter trochanteric region.  There is no evidence of dislocation. There is no evidence of significant arthropathy or other focal bone abnormality. Small phleboliths are seen within the pelvis on the left. IMPRESSION: Acute fracture of the proximal left femur. Electronically Signed   By: Virgina Norfolk M.D.   On: 08/17/2019 20:27     EKG: Independently reviewed.  Assessment/Plan Principal Problem:   Closed left femoral fracture (HCC)   Accidental fall at home, initial encounter   Preoperative clearance -Patient believed to have had an accidental fall at home sustaining left proximal femur fracture -History of A. fib on aspirin  hypertension and hypothyroidism.  No history of CAD, CHF, COPD, OSA, arrhythmias or stroke,.  Independent at baseline -Patient is at low risk for perioperative cardiopulmonary complications and benefits of proceeding with orthopedic repair outweighs risks -Consulted orthopedics, Dr. Harlow Mares for continued management   S/P  cataract extraction 08/16/19  Glaucoma -Patient reports no visual changes following fall -Continue to monitor.  If worsening may need ophthalmology consult --Resume eye drops    Hypothyroidism -Continue levothyroxine.  Hold when n.p.o.    Atrial fibrillation, chronic (HCC) -Rate controlled on carvedilol, to continue until placed n.p.o.    Essential hypertension -Continue carvedilol as above    DVT prophylaxis: Lovenox  Code Status: DNR Family Communication:  none  Disposition Plan: Back to previous home environment Consults called: Orthopedics Dr. Harlow Mares Status: Inpatient    Athena Masse MD [facility]     08/17/2019, 9:21 PM

## 2019-08-17 NOTE — ED Triage Notes (Signed)
Pt here via [facility] from home after falling in his kitchen today. Pt here with obvious deformity to left hip with shortening and small skin tear present on left elbow.   Pt had cataracts surgery yesterday.   EMS VS- bp 155/109, hr 86, o2 97% RA.

## 2019-08-17 NOTE — ED Provider Notes (Signed)
[facility] Emergency Department Provider Note   ____________________________________________   First MD Initiated Contact with Patient 08/17/19 1924     (approximate)  I have reviewed the triage vital signs and the nursing notes.   HISTORY  Chief Complaint Fall and Hip Pain    HPI Jeffrey Ashley is a 86 y.o. male with past medical history of hypertension and atrial fibrillation who presents to the ED following fall.  Patient reports that he slipped and fell in his kitchen approximately 1 hour prior to arrival, striking his left side.  He had cataract surgery performed 2 days ago and has been having trouble getting around since then.  He does not think he hit his head or lost consciousness, denies any headache or neck pain.  He primarily complains of pain at his left hip but also has some pain near his left elbow.  He states he has been unable to move his left leg without severe pain since the fall and has been unable to walk.  He does not currently take any blood thinners for his atrial fibrillation.        Past Medical History:  Diagnosis Date  . 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 Dysrhythmia    AFIB  . HOH (hard of hearing)   . Hypertension   . Hypothyroidism   . Nontraumatic subdural hematoma ([place])   . 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   Past Surgical History:  Procedure Laterality Date  . APPENDECTOMY    . BRAIN SURGERY     CRANIO  . CATARACT EXTRACTION W/PHACO Left 03/06/2017   Procedure: CATARACT EXTRACTION PHACO AND INTRAOCULAR LENS PLACEMENT (IOC);  Surgeon: Eulogio Bear, MD;  Location: [facility] ORS;  Service: Ophthalmology;  Laterality: Left;  [place] 01:10.8 AP% 18.8 CDE 13.39 FLUID PACK LOT # [id number]  . CATARACT EXTRACTION W/PHACO Right 08/16/2019   Procedure: CATARACT EXTRACTION PHACO AND INTRAOCULAR LENS PLACEMENT (IOC) RIGHT ISTENT INJ 8.84  01:03.5;  Surgeon:  Eulogio Bear, MD;  Location: [place];  Service: Ophthalmology;  Laterality: Right;  . CHOLECYSTECTOMY    . Kidney Tumor Removal      Prior to Admission medications   Medication Sig Start Date End Date Taking? Authorizing Provider  carvedilol (COREG) 6.25 MG tablet Take 6.25 mg by mouth 2 (two) times daily. 06/13/16   [provider]  Cholecalciferol (VITAMIN D-3) 5000 units TABS Take 5,000 Units by mouth daily.    [provider]  latanoprost (XALATAN) 0.005 % ophthalmic solution Apply 1 drop to eye at bedtime.    [provider]  levothyroxine (SYNTHROID, LEVOTHROID) 50 MCG tablet Take 50 mcg by mouth daily before breakfast. 02/11/17   [provider]  Multiple Vitamins-Minerals (PRESERVISION AREDS 2 PO) Take by mouth 2 (two) times daily.    [provider]  timolol (TIMOPTIC) 0.5 % ophthalmic solution Place 1 drop into both eyes at bedtime.    [provider]  vitamin B-12 (CYANOCOBALAMIN) 500 MCG tablet Take 500 mcg by mouth 2 (two) times a week.    [provider]    Allergies Brimonidine tartrate and Combigan [brimonidine tartrate-timolol]  History reviewed. No pertinent family history.  Social History Social History   Tobacco Use  . Smoking status: Never Smoker  . Smokeless tobacco: Never Used  Substance Use Topics  . Alcohol use: Not Currently  Comment: OCCAS  . Drug use: No    Review of Systems  Constitutional: No fever/chills Eyes: No visual changes. ENT: No sore throat. Cardiovascular: Denies chest pain. Respiratory: Denies shortness of breath. Gastrointestinal: No abdominal pain.  No nausea, no vomiting.  No diarrhea.  No constipation. Genitourinary: Negative for dysuria. Musculoskeletal: Negative for back pain.  Positive for left elbow and left hip pain. Skin: Negative for rash. Neurological: Negative for headaches, focal weakness or  numbness.  ____________________________________________   PHYSICAL EXAM:  VITAL SIGNS: ED Triage Vitals [08/17/19 1919]  Enc Vitals Group     BP (!) 164/103     Pulse Rate 95     Resp (!) 23     Temp 98.1 F (36.7 C)     Temp Source Oral     SpO2 97 %     Weight 177 lb (80.3 kg)     Height 6\' 1"  (1.854 m)     Head Circumference      Peak Flow      Pain Score      Pain Loc      Pain Edu?      Excl. in GC?     Constitutional: Alert and oriented. Eyes: Conjunctivae are normal. Head: Atraumatic. Nose: No congestion/rhinnorhea. Mouth/Throat: Mucous membranes are moist. Neck: Normal ROM, no midline cervical spine tenderness to palpation. Cardiovascular: Normal rate, regular rhythm. Grossly normal heart sounds.  2+ DP pulses bilaterally. Respiratory: Normal respiratory effort.  No retractions. Lungs CTAB.  No chest wall tenderness to palpation. Gastrointestinal: Soft and nontender. No distention. Genitourinary: deferred Musculoskeletal: Obvious deformity to left hip with left lower extremity shortening and external rotation, left hip diffusely tender to palpation.  No tenderness to right hip, bilateral knees or ankles.  Tenderness to palpation noted to left elbow but otherwise no upper extremity tenderness. Neurologic:  Normal speech and language. No gross focal neurologic deficits are appreciated. Skin:  Skin is warm, dry and intact. No rash noted. Psychiatric: Mood and affect are normal. Speech and behavior are normal.  ____________________________________________   LABS (all labs ordered are listed, but only abnormal results are displayed)  Labs Reviewed  CBC WITH DIFFERENTIAL/PLATELET - Abnormal; Notable for the following components:      Result Value   Abs Immature Granulocytes 0.11 (*)    All other components within normal limits  BASIC METABOLIC PANEL   ____________________________________________  EKG  ED ECG REPORT I, , the attending  physician, personally viewed and interpreted this ECG.   Date: 08/17/2019  EKG Time: 19:22  Rate: 96  Rhythm: atrial fibrillation, rate 96  Axis: Normal  Intervals:none  ST&T Change: None   PROCEDURES  Procedure(s) performed (including Critical Care):  Procedures   ____________________________________________   INITIAL IMPRESSION / ASSESSMENT AND PLAN / ED COURSE       86 year old male presents to the ED following a fall at home striking his left side, reports he has had difficulty getting around after cataract surgery 2 days ago.  He has left hip pain and obvious deformity to left hip, but is neurovascular intact to his left lower extremity.  He also has tenderness and abrasion at his left elbow.  We will further assess with x-rays of his left hip and left elbow.  He is also unsure whether he hit his head or lost consciousness, will check CT head and C-spine.  EKG shows atrial fibrillation, which patient has a known history of but is not anticoagulated for.  He last  had anything to eat or drink around 5:30 PM.  Patient turned over to oncoming provider pending additional imaging results, thus far CT head is negative for acute process.      ____________________________________________   FINAL CLINICAL IMPRESSION(S) / ED DIAGNOSES  Final diagnoses:  Fall, initial encounter     ED Discharge Orders    None       Note:  This document was prepared using Dragon voice recognition software and may include unintentional dictation errors.   Chesley Noon, MD 08/17/19 2023

## 2019-08-17 NOTE — Consult Note (Signed)
Full consult note to follow. Plan operative fixation of left femur fracture on 08/18/2019. Please make NPO after MN.

## 2019-08-17 NOTE — ED Notes (Signed)
Pt in CT.

## 2019-08-18 ENCOUNTER — Inpatient Hospital Stay: Payer: Medicare Other | Admitting: Anesthesiology

## 2019-08-18 ENCOUNTER — Encounter
Admission: EM | Disposition: A | Discharge status: Skilled Nursing Facility | Payer: Self-pay | Source: Home / Self Care | Attending: Internal Medicine

## 2019-08-18 ENCOUNTER — Inpatient Hospital Stay: Payer: Medicare Other

## 2019-08-18 ENCOUNTER — Encounter: Payer: Self-pay | Admitting: Internal Medicine

## 2019-08-18 DIAGNOSIS — I10 Essential (primary) hypertension: Secondary | ICD-10-CM

## 2019-08-18 DIAGNOSIS — I482 Chronic atrial fibrillation, unspecified: Secondary | ICD-10-CM

## 2019-08-18 HISTORY — PX: INTRAMEDULLARY (IM) NAIL INTERTROCHANTERIC: SHX5875

## 2019-08-18 LAB — CBC
HCT: 41.1 % (ref 39.0–52.0)
HCT: 42 % (ref 39.0–52.0)
Hemoglobin: 14.1 g/dL (ref 13.0–17.0)
Hemoglobin: 14.2 g/dL (ref 13.0–17.0)
MCH: 31.3 pg (ref 26.0–34.0)
MCH: 31.3 pg (ref 26.0–34.0)
MCHC: 34.3 g/dL (ref 30.0–36.0)
MCHC: 33.8 g/dL (ref 30.0–36.0)
MCV: 91.3 fL (ref 80.0–100.0)
MCV: 92.5 fL (ref 80.0–100.0)
Platelets: 156 10*3/uL (ref 150–400)
Platelets: 155 10*3/uL (ref 150–400)
RBC: 4.5 MIL/uL (ref 4.22–5.81)
RBC: 4.54 MIL/uL (ref 4.22–5.81)
RDW: 13 % (ref 11.5–15.5)
RDW: 13.2 % (ref 11.5–15.5)
WBC: 8.4 10*3/uL (ref 4.0–10.5)
WBC: 11 10*3/uL — ABNORMAL HIGH (ref 4.0–10.5)
nRBC: 0 % (ref 0.0–0.2)
nRBC: 0 % (ref 0.0–0.2)

## 2019-08-18 LAB — BASIC METABOLIC PANEL
Anion gap: 8 (ref 5–15)
BUN: 24 mg/dL — ABNORMAL HIGH (ref 8–23)
CO2: 25 mmol/L (ref 22–32)
Calcium: 8.9 mg/dL (ref 8.9–10.3)
Chloride: 104 mmol/L (ref 98–111)
Creatinine, Ser: 0.74 mg/dL (ref 0.61–1.24)
GFR calc Af Amer: >60 mL/min (ref >60)
GFR calc non Af Amer: >60 mL/min (ref >60)
Glucose, Bld: 147 mg/dL — ABNORMAL HIGH (ref 70–99)
Potassium: 4.5 mmol/L (ref 3.5–5.1)
Sodium: 137 mmol/L (ref 135–145)

## 2019-08-18 LAB — TYPE AND SCREEN
ABO/RH(D): O POS
Antibody Screen: NEGATIVE

## 2019-08-18 LAB — CREATININE, SERUM
Creatinine, Ser: 0.84 mg/dL (ref 0.61–1.24)
GFR calc Af Amer: >60 mL/min (ref >60)
GFR calc non Af Amer: >60 mL/min (ref >60)

## 2019-08-18 LAB — SURGICAL PCR SCREEN
MRSA, PCR: NEGATIVE
Staphylococcus aureus: NEGATIVE

## 2019-08-18 SURGERY — FIXATION, FRACTURE, INTERTROCHANTERIC, WITH INTRAMEDULLARY ROD
Anesthesia: Choice | Site: Hip | Laterality: Left

## 2019-08-18 MED ORDER — BUPIVACAINE-EPINEPHRINE (PF) 0.25% -1:200000 IJ SOLN
INTRAMUSCULAR | Status: DC | PRN
  Administered 2019-08-18: 30 mL via PERINEURAL

## 2019-08-18 MED ORDER — KETOROLAC TROMETHAMINE 15 MG/ML IJ SOLN
7.5000 mg | Freq: Four times a day (QID) | INTRAMUSCULAR | Status: AC
  Administered 2019-08-18 – 2019-08-19 (×4): 7.5 mg via INTRAVENOUS
  Filled 2019-08-18 (×4): qty 1

## 2019-08-18 MED ORDER — ACETAMINOPHEN 10 MG/ML IV SOLN: 1000.0000 mg | Freq: Once | INTRAVENOUS | Status: DC | PRN

## 2019-08-18 MED ORDER — PHENYLEPHRINE HCL (PRESSORS) 10 MG/ML IV SOLN
INTRAVENOUS | Status: AC
  Filled 2019-08-18: qty 1

## 2019-08-18 MED ORDER — ACETAMINOPHEN 325 MG PO TABS: 325.0000 mg | ORAL_TABLET | Freq: Four times a day (QID) | ORAL | Status: DC | PRN

## 2019-08-18 MED ORDER — EPINEPHRINE PF 1 MG/ML IJ SOLN
INTRAMUSCULAR | Status: AC
  Filled 2019-08-18: qty 1

## 2019-08-18 MED ORDER — TRANEXAMIC ACID 1000 MG/10ML IV SOLN
INTRAVENOUS | Status: DC | PRN
  Administered 2019-08-18: 1000 mg via TOPICAL

## 2019-08-18 MED ORDER — NONFORMULARY OR COMPOUNDED ITEM
1.0000 [drp] | Freq: Four times a day (QID) | Status: DC
  Administered 2019-08-18 – 2019-08-20 (×6): 1 [drp] via OPHTHALMIC
  Filled 2019-08-18: qty 1

## 2019-08-18 MED ORDER — HYDROCODONE-ACETAMINOPHEN 5-325 MG PO TABS: 1.0000 | ORAL_TABLET | ORAL | Status: DC | PRN

## 2019-08-18 MED ORDER — FENTANYL CITRATE (PF) 100 MCG/2ML IJ SOLN
INTRAMUSCULAR | Status: AC
  Filled 2019-08-18: qty 2

## 2019-08-18 MED ORDER — CEFAZOLIN SODIUM-DEXTROSE 1-4 GM/50ML-% IV SOLN
1.0000 g | Freq: Four times a day (QID) | INTRAVENOUS | Status: AC
  Administered 2019-08-18 – 2019-08-19 (×3): 1 g via INTRAVENOUS
  Filled 2019-08-18 (×4): qty 50

## 2019-08-18 MED ORDER — METOCLOPRAMIDE HCL 10 MG PO TABS: 5.0000 mg | ORAL_TABLET | Freq: Three times a day (TID) | ORAL | Status: DC | PRN

## 2019-08-18 MED ORDER — BACITRACIN 50000 UNITS IM SOLR
INTRAMUSCULAR | Status: AC
  Filled 2019-08-18: qty 2

## 2019-08-18 MED ORDER — METOCLOPRAMIDE HCL 5 MG/ML IJ SOLN: 5.0000 mg | Freq: Three times a day (TID) | INTRAMUSCULAR | Status: DC | PRN

## 2019-08-18 MED ORDER — DOCUSATE SODIUM 100 MG PO CAPS: 100.0000 mg | ORAL_CAPSULE | Freq: Two times a day (BID) | ORAL | Status: DC

## 2019-08-18 MED ORDER — BUPIVACAINE HCL (PF) 0.25 % IJ SOLN
INTRAMUSCULAR | Status: AC
  Filled 2019-08-18: qty 30

## 2019-08-18 MED ORDER — SODIUM CHLORIDE 0.9 % IR SOLN
Status: DC | PRN
  Administered 2019-08-18: 200 mL

## 2019-08-18 MED ORDER — HYDRALAZINE HCL 20 MG/ML IJ SOLN
10.0000 mg | Freq: Four times a day (QID) | INTRAMUSCULAR | Status: DC | PRN
  Administered 2019-08-18: 10 mg via INTRAVENOUS
  Filled 2019-08-18 (×2): qty 0.5

## 2019-08-18 MED ORDER — ONDANSETRON HCL 4 MG/2ML IJ SOLN
INTRAMUSCULAR | Status: DC | PRN
  Administered 2019-08-18: 4 mg via INTRAVENOUS

## 2019-08-18 MED ORDER — SODIUM CHLORIDE 0.9 % IV SOLN
INTRAVENOUS | Status: DC | PRN
  Administered 2019-08-18: 25 ug/min via INTRAVENOUS

## 2019-08-18 MED ORDER — SODIUM CHLORIDE FLUSH 0.9 % IV SOLN
INTRAVENOUS | Status: AC
  Filled 2019-08-18: qty 20

## 2019-08-18 MED ORDER — FENTANYL CITRATE (PF) 100 MCG/2ML IJ SOLN
INTRAMUSCULAR | Status: DC | PRN
  Administered 2019-08-18 (×4): 25 ug via INTRAVENOUS

## 2019-08-18 MED ORDER — ONDANSETRON HCL 4 MG/2ML IJ SOLN
4.0000 mg | Freq: Four times a day (QID) | INTRAMUSCULAR | Status: DC | PRN
  Administered 2019-08-19: 4 mg via INTRAVENOUS
  Filled 2019-08-18: qty 2

## 2019-08-18 MED ORDER — TRANEXAMIC ACID 1000 MG/10ML IV SOLN
INTRAVENOUS | Status: AC
  Filled 2019-08-18: qty 10

## 2019-08-18 MED ORDER — ONDANSETRON HCL 4 MG PO TABS: 4.0000 mg | ORAL_TABLET | Freq: Four times a day (QID) | ORAL | Status: DC | PRN

## 2019-08-18 MED ORDER — CEFAZOLIN SODIUM-DEXTROSE 2-4 GM/100ML-% IV SOLN
2.0000 g | INTRAVENOUS | Status: AC
  Administered 2019-08-18: 2 g via INTRAVENOUS
  Filled 2019-08-18: qty 100

## 2019-08-18 MED ORDER — ENSURE ENLIVE PO LIQD
237.0000 mL | Freq: Three times a day (TID) | ORAL | Status: DC
  Administered 2019-08-19 – 2019-08-20 (×3): 237 mL via ORAL
  Filled 2019-08-18: qty 237

## 2019-08-18 MED ORDER — LACTATED RINGERS IV SOLN: Freq: Once | INTRAVENOUS | Status: AC

## 2019-08-18 MED ORDER — LACTATED RINGERS IV SOLN: INTRAVENOUS | Status: DC

## 2019-08-18 MED ORDER — HYDROCODONE-ACETAMINOPHEN 7.5-325 MG PO TABS: 1.0000 | ORAL_TABLET | ORAL | Status: DC | PRN

## 2019-08-18 MED ORDER — TRANEXAMIC ACID 1000 MG/10ML IV SOLN
2000.0000 mg | Freq: Once | INTRAVENOUS | Status: DC
  Filled 2019-08-18: qty 20

## 2019-08-18 MED ORDER — OXYCODONE HCL 5 MG PO TABS: 5.0000 mg | ORAL_TABLET | Freq: Once | ORAL | Status: DC | PRN

## 2019-08-18 MED ORDER — ACETAMINOPHEN 500 MG PO TABS
500.0000 mg | ORAL_TABLET | Freq: Four times a day (QID) | ORAL | Status: AC
  Administered 2019-08-18 – 2019-08-19 (×4): 500 mg via ORAL
  Filled 2019-08-18 (×4): qty 1

## 2019-08-18 MED ORDER — BUPIVACAINE HCL (PF) 0.5 % IJ SOLN
INTRAMUSCULAR | Status: AC
  Filled 2019-08-18: qty 10

## 2019-08-18 MED ORDER — BUPIVACAINE HCL (PF) 0.5 % IJ SOLN
INTRAMUSCULAR | Status: DC | PRN
  Administered 2019-08-18: 2.5 mL via INTRATHECAL

## 2019-08-18 MED ORDER — PROPOFOL 500 MG/50ML IV EMUL
INTRAVENOUS | Status: DC | PRN
  Administered 2019-08-18: 25 ug/kg/min via INTRAVENOUS

## 2019-08-18 MED ORDER — ADULT MULTIVITAMIN W/MINERALS CH
1.0000 | ORAL_TABLET | Freq: Every day | ORAL | Status: DC
  Administered 2019-08-19 – 2019-08-20 (×2): 1 via ORAL
  Filled 2019-08-18 (×3): qty 1

## 2019-08-18 MED ORDER — FENTANYL CITRATE (PF) 100 MCG/2ML IJ SOLN: 25.0000 ug | INTRAMUSCULAR | Status: DC | PRN

## 2019-08-18 MED ORDER — LATANOPROST 0.005 % OP SOLN
1.0000 [drp] | Freq: Every day | OPHTHALMIC | Status: DC
  Administered 2019-08-18 – 2019-08-19 (×2): 1 [drp] via OPHTHALMIC
  Filled 2019-08-18: qty 2.5

## 2019-08-18 MED ORDER — TIMOLOL MALEATE 0.5 % OP SOLN
1.0000 [drp] | Freq: Every day | OPHTHALMIC | Status: DC
  Administered 2019-08-18 – 2019-08-19 (×2): 1 [drp] via OPHTHALMIC
  Filled 2019-08-18: qty 5

## 2019-08-18 MED ORDER — OXYCODONE HCL 5 MG/5ML PO SOLN: 5.0000 mg | Freq: Once | ORAL | Status: DC | PRN

## 2019-08-18 MED ORDER — MORPHINE SULFATE (PF) 2 MG/ML IV SOLN: 0.5000 mg | INTRAVENOUS | Status: DC | PRN

## 2019-08-18 MED ORDER — ONDANSETRON HCL 4 MG/2ML IJ SOLN: 4.0000 mg | Freq: Once | INTRAMUSCULAR | Status: DC | PRN

## 2019-08-18 SURGICAL SUPPLY — 36 items
BLADE TFNA HELICAL 105 STRL (Anchor) ×2 IMPLANT
BNDG COHESIVE 4X5 TAN STRL (GAUZE/BANDAGES/DRESSINGS) IMPLANT
BRUSH SCRUB EZ  4% CHG (MISCELLANEOUS) ×2
BRUSH SCRUB EZ 4% CHG (MISCELLANEOUS) ×2 IMPLANT
CANISTER SUCT 1200ML W/VALVE (MISCELLANEOUS) ×3 IMPLANT
CHLORAPREP W/TINT 26 (MISCELLANEOUS) ×5 IMPLANT
COVER WAND RF STERILE (DRAPES) ×3 IMPLANT
DRAPE 3/4 80X56 (DRAPES) ×3 IMPLANT
DRAPE U-SHAPE 47X51 STRL (DRAPES) ×3 IMPLANT
DRSG AQUACEL AG ADV 3.5X 4 (GAUZE/BANDAGES/DRESSINGS) ×4 IMPLANT
DRSG AQUACEL AG ADV 3.5X10 (GAUZE/BANDAGES/DRESSINGS) ×1 IMPLANT
ELECT REM PT RETURN 9FT ADLT (ELECTROSURGICAL) ×3
ELECTRODE REM PT RTRN 9FT ADLT (ELECTROSURGICAL) ×1 IMPLANT
GAUZE XEROFORM 1X8 LF (GAUZE/BANDAGES/DRESSINGS) ×3 IMPLANT
GLOVE INDICATOR 8.0 STRL GRN (GLOVE) ×3 IMPLANT
GLOVE SURG ORTHO 8.0 STRL STRW (GLOVE) ×3 IMPLANT
GOWN STRL REUS W/ TWL LRG LVL3 (GOWN DISPOSABLE) ×1 IMPLANT
GOWN STRL REUS W/ TWL XL LVL3 (GOWN DISPOSABLE) ×1 IMPLANT
GOWN STRL REUS W/TWL LRG LVL3 (GOWN DISPOSABLE) ×2
GOWN STRL REUS W/TWL XL LVL3 (GOWN DISPOSABLE) ×2
GUIDEWIRE 3.2X400 (WIRE) ×2 IMPLANT
KIT PATIENT CARE HANA TABLE (KITS) ×3 IMPLANT
KIT TURNOVER CYSTO (KITS) ×3 IMPLANT
MAT ABSORB  FLUID 56X50 GRAY (MISCELLANEOUS) ×2
MAT ABSORB FLUID 56X50 GRAY (MISCELLANEOUS) ×1 IMPLANT
NAIL TFNA LEFT 130D 400 (Nail) ×2 IMPLANT
NDL SPNL 20GX3.5 QUINCKE YW (NEEDLE) ×1 IMPLANT
NEEDLE SPNL 20GX3.5 QUINCKE YW (NEEDLE) ×3 IMPLANT
NS IRRIG 1000ML POUR BTL (IV SOLUTION) ×3 IMPLANT
PACK HIP COMPR (MISCELLANEOUS) ×3 IMPLANT
STAPLER SKIN PROX 35W (STAPLE) ×3 IMPLANT
SUT VIC AB 0 CT1 36 (SUTURE) ×3 IMPLANT
SUT VIC AB 2-0 CT1 27 (SUTURE) ×2
SUT VIC AB 2-0 CT1 TAPERPNT 27 (SUTURE) ×1 IMPLANT
SYR 30ML LL (SYRINGE) ×3 IMPLANT
TOWEL OR 17X26 4PK STRL BLUE (TOWEL DISPOSABLE) ×3 IMPLANT

## 2019-08-18 NOTE — Anesthesia Postprocedure Evaluation (Signed)
Anesthesia Post Note  Patient: Jeffrey Ashley  Procedure(s) Performed: INTRAMEDULLARY (IM) NAIL INTERTROCHANTRIC (Left Hip)  Patient location during evaluation: PACU Anesthesia Type: Combined General/Spinal Level of consciousness: awake and alert Pain management: pain level controlled Vital Signs Assessment: post-procedure vital signs reviewed and stable Respiratory status: spontaneous breathing, nonlabored ventilation, respiratory function stable and patient connected to nasal cannula oxygen Cardiovascular status: blood pressure returned to baseline and stable Postop Assessment: no apparent nausea or vomiting, no headache, no backache and spinal receding Anesthetic complications: no     Last Vitals:  Vitals:   08/18/19 1424 08/18/19 1439  BP: 131/77 136/85  Pulse: 84 83  Resp: 14 17  Temp:    SpO2: 92% 96%    Last Pain:  Vitals:   08/18/19 1424  TempSrc:   PainSc: 0-No pain                 Corinda Gubler

## 2019-08-18 NOTE — Progress Notes (Signed)
PROGRESS NOTE    Adams Hinch  GUR:[id number] DOB: 31-Jan-1927 DOA: 08/17/2019 PCP: Marguarite Arbour, MD       Assessment & Plan:   Principal Problem:   Closed left femoral fracture [facility]) Active Problems:   Atrial fibrillation, chronic (HCC)   Essential hypertension   Hypothyroidism   Fall at home, initial encounter   Preoperative clearance   S/P cataract extraction   Closed left hip fracture, initial encounter (HCC)   Closed left femoral fracture: secondary to fall at home. Surgery today as per ortho surg. Ortho surg recs apprec  Glaucoma: continue on home dose of timolol & latanoprost eye drops.  S/p cataract extraction 08/16/19  Hypothyroidism: continue levothyroxine  Atrial fibrillation: chronic. Continue on carvedilol. Not on anticoagulation at home, unsure of reason not on anticoagulation, possible fall risk.   Essential hypertension: continue carvedilol    DVT prophylaxis: lovenox Code Status: full  Family Communication: called pt's son, Thayer Ohm, no answer  Disposition Plan: depends on PT/OT recs    Consultants:   Ortho surg   Procedures:    Antimicrobials:   Subjective: Pt c/o left hip pain.   Objective: Vitals:   08/17/19 2300 08/17/19 2325 08/18/19 0003 08/18/19 0421  BP: (!) 137/95 128/77 (!) 154/85 132/83  Pulse: (!) 37 (!) 113 94 90  Resp: 15 18 17 18   Temp:   98.2 F (36.8 C) 98.6 F (37 C)  TempSrc:   Oral Oral  SpO2: 97% 97% 96% 96%  Weight:   80.7 kg   Height:   6' (1.829 m)     Intake/Output Summary (Last 24 hours) at 08/18/2019 0721 Last data filed at 08/18/2019 0653 Gross per 24 hour  Intake 214.03 ml  Output 300 ml  Net -85.97 ml   Filed Weights   08/17/19 1919 08/18/19 0003  Weight: 80.3 kg 80.7 kg    Examination:  General exam: Appears calm and comfortable  Respiratory system: Clear to auscultation. Respiratory effort normal. No rales, wheezes Cardiovascular system: S1 & S2+. No rubs, gallops or clicks.   Gastrointestinal system: Abdomen is nondistended, soft and nontender. Normal bowel sounds heard. Central nervous system: Alert and oriented. Moves all 4 extremities  Extremities: LLE is shorten & decreased ROM in all directions Psychiatry: Judgement and insight appear normal. Mood & affect appropriate.     Data Reviewed: I have personally reviewed following labs and imaging studies  CBC: Recent Labs  Lab 08/17/19 1920 08/18/19 0527  WBC 9.4 8.4  NEUTROABS 7.1  --   HGB 14.2 14.1  HCT 42.3 41.1  MCV 91.8 91.3  PLT 155 156   Basic Metabolic Panel: Recent Labs  Lab 08/17/19 1920 08/18/19 0527  NA 136 137  K 4.4 4.5  CL 103 104  CO2 25 25  GLUCOSE 137* 147*  BUN 27* 24*  CREATININE 0.97 0.74  CALCIUM 8.9 8.9   GFR: Estimated Creatinine Clearance: 67.4 mL/min (by C-G formula based on SCr of 0.74 mg/dL). Liver Function Tests: No results for input(s): AST, ALT, ALKPHOS, BILITOT, PROT, ALBUMIN in the last 168 hours. No results for input(s): LIPASE, AMYLASE in the last 168 hours. No results for input(s): AMMONIA in the last 168 hours. Coagulation Profile: No results for input(s): INR, PROTIME in the last 168 hours. Cardiac Enzymes: No results for input(s): CKTOTAL, CKMB, CKMBINDEX, TROPONINI in the last 168 hours. BNP (last 3 results) No results for input(s): PROBNP in the last 8760 hours. HbA1C: No results for input(s): HGBA1C in the  last 72 hours. CBG: Recent Labs  Lab 08/16/19 0935  GLUCAP 102*   Lipid Profile: No results for input(s): CHOL, HDL, LDLCALC, TRIG, CHOLHDL, LDLDIRECT in the last 72 hours. Thyroid Function Tests: No results for input(s): TSH, T4TOTAL, FREET4, T3FREE, THYROIDAB in the last 72 hours. Anemia Panel: No results for input(s): VITAMINB12, FOLATE, FERRITIN, TIBC, IRON, RETICCTPCT in the last 72 hours. Sepsis Labs: No results for input(s): PROCALCITON, LATICACIDVEN in the last 168 hours.  Recent Results (from the past 240 hour(s))  SARS  CORONAVIRUS 2 (TAT 6-24 HRS) Nasopharyngeal Nasopharyngeal Swab     Status: None   Collection Time: 08/12/19 10:59 AM   Specimen: Nasopharyngeal Swab  Result Value Ref Range Status   SARS Coronavirus 2 NEGATIVE NEGATIVE Final    Comment: (NOTE) SARS-CoV-2 target nucleic acids are NOT DETECTED. The SARS-CoV-2 RNA is generally detectable in upper and lower respiratory specimens during the acute phase of infection. Negative results do not preclude SARS-CoV-2 infection, do not rule out co-infections with other pathogens, and should not be used as the sole basis for treatment or other patient management decisions. Negative results must be combined with clinical observations, patient history, and epidemiological information. The expected result is Negative. Fact Sheet for Patients: HairSlick.no Fact Sheet for Healthcare Providers: quierodirigir.com This test is not yet approved or cleared by the [place] FDA and  has been authorized for detection and/or diagnosis of SARS-CoV-2 by FDA under an Emergency Use Authorization (EUA). This EUA will remain  in effect (meaning this test can be used) for the duration of the COVID-19 declaration under Section 56 4(b)(1) of the Act, 21 U.S.C. section 360bbb-3(b)(1), unless the authorization is terminated or revoked sooner. Performed at [facility], [address], [place], [place] [address]   SARS Coronavirus 2 by RT PCR (hospital order, performed in [facility] hospital lab) Nasopharyngeal Nasopharyngeal Swab     Status: None   Collection Time: 08/17/19  7:20 PM   Specimen: Nasopharyngeal Swab  Result Value Ref Range Status   SARS Coronavirus 2 NEGATIVE NEGATIVE Final    Comment: (NOTE) SARS-CoV-2 target nucleic acids are NOT DETECTED. The SARS-CoV-2 RNA is generally detectable in upper and lower respiratory specimens during the acute phase of infection. The lowest concentration of  SARS-CoV-2 viral copies this assay can detect is 250 copies / mL. A negative result does not preclude SARS-CoV-2 infection and should not be used as the sole basis for treatment or other patient management decisions.  A negative result may occur with improper specimen collection / handling, submission of specimen other than nasopharyngeal swab, presence of viral mutation(s) within the areas targeted by this assay, and inadequate number of viral copies (<250 copies / mL). A negative result must be combined with clinical observations, patient history, and epidemiological information. Fact Sheet for Patients:   BoilerBrush.com.cy Fact Sheet for Healthcare Providers: https://pope.com/ This test is not yet approved or cleared  by the [place] FDA and has been authorized for detection and/or diagnosis of SARS-CoV-2 by FDA under an Emergency Use Authorization (EUA).  This EUA will remain in effect (meaning this test can be used) for the duration of the COVID-19 declaration under Section 564(b)(1) of the Act, 21 U.S.C. section 360bbb-3(b)(1), unless the authorization is terminated or revoked sooner. Performed at [facility], [address], [place], [place] [address]   Surgical PCR screen     Status: None   Collection Time: 08/18/19 12:26 AM   Specimen: Nasal Mucosa; Nasal  Swab  Result Value Ref Range Status   MRSA, PCR NEGATIVE NEGATIVE Final   Staphylococcus aureus NEGATIVE NEGATIVE Final    Comment: (NOTE) The Xpert SA Assay (FDA approved for NASAL specimens in patients 19 years of age and older), is one component of a comprehensive surveillance program. It is not intended to diagnose infection nor to guide or monitor treatment. Performed at [facility], [address], [place], [place] [address]          Radiology Studies: DG Elbow 2 Views Left  Result Date: 08/17/2019 CLINICAL DATA:  Fall EXAM: LEFT  ELBOW - 2 VIEW COMPARISON:  None. FINDINGS: There is no evidence of fracture, dislocation, or joint effusion. There is no evidence of arthropathy or other focal bone abnormality. Soft tissues are unremarkable. IMPRESSION: Negative. Electronically Signed   By: Donavan Foil M.D.   On: 08/17/2019 20:25   CT Head Wo Contrast  Result Date: 08/17/2019 CLINICAL DATA:  Status post fall. EXAM: CT HEAD WITHOUT CONTRAST TECHNIQUE: Contiguous axial images were obtained from the base of the skull through the vertex without intravenous contrast. COMPARISON:  July 07, 2016 FINDINGS: Brain: There is mild cerebral atrophy with widening of the extra-axial spaces and ventricular dilatation. There are areas of decreased attenuation within the white matter tracts of the supratentorial brain, consistent with microvascular disease changes. Vascular: No hyperdense vessel or unexpected calcification. Skull: Right parietal and right frontoparietal burr holes are seen. Sinuses/Orbits: There is marked severity bilateral ethmoid sinus and left maxillary sinus mucosal thickening. Other: None. IMPRESSION: 1. Generalized cerebral atrophy. 2. No acute intracranial abnormality. 3. Marked severity bilateral ethmoid sinus and left maxillary sinus disease. Electronically Signed   By: Virgina Norfolk M.D.   On: 08/17/2019 20:10   CT Cervical Spine Wo Contrast  Result Date: 08/17/2019 CLINICAL DATA:  Status post fall. EXAM: CT CERVICAL SPINE WITHOUT CONTRAST TECHNIQUE: Multidetector CT imaging of the cervical spine was performed without intravenous contrast. Multiplanar CT image reconstructions were also generated. COMPARISON:  None. FINDINGS: Alignment: Normal. Skull base and vertebrae: No acute fracture. No primary bone lesion or focal pathologic process. Soft tissues and spinal canal: No prevertebral fluid or swelling. No visible canal hematoma. Disc levels: Moderate severity multilevel endplate sclerosis is seen throughout the cervical  spine with moderate to marked severity multilevel intervertebral disc space narrowing. Moderate severity bilateral multilevel facet joint hypertrophy is noted. Upper chest: Negative. Other: None. IMPRESSION: 1. No acute osseous abnormality of the cervical spine. 2. Moderate to marked severity multilevel degenerative changes. Electronically Signed   By: Virgina Norfolk M.D.   On: 08/17/2019 20:16   DG Chest Portable 1 View  Result Date: 08/17/2019 CLINICAL DATA:  Status post fall. EXAM: PORTABLE CHEST 1 VIEW COMPARISON:  March 14, 2013 FINDINGS: There is no evidence of acute infiltrate, pleural effusion or pneumothorax. The cardiac silhouette is markedly enlarged. There is moderate severity calcification of the aortic arch. The visualized skeletal structures are unremarkable. IMPRESSION: Cardiomegaly without evidence of acute or active cardiopulmonary disease. Electronically Signed   By: Virgina Norfolk M.D.   On: 08/17/2019 20:28   DG Hip Unilat W or Wo Pelvis 2-3 Views Left  Result Date: 08/17/2019 CLINICAL DATA:  Status post fall. EXAM: DG HIP (WITH OR WITHOUT PELVIS) 2-3V LEFT COMPARISON:  None. FINDINGS: Acute fracture is seen involving the greater trochanter of the proximal left femur. There is possible involvement of the inter trochanteric region. There is no evidence of dislocation. There is no evidence of  significant arthropathy or other focal bone abnormality. Small phleboliths are seen within the pelvis on the left. IMPRESSION: Acute fracture of the proximal left femur. Electronically Signed   By: Aram Candela M.D.   On: 08/17/2019 20:27        Scheduled Meds: . aspirin EC  81 mg Oral Daily  . carvedilol  6.25 mg Oral BID  . docusate sodium  100 mg Oral BID  . enoxaparin (LOVENOX) injection  40 mg Subcutaneous Q24H  . latanoprost  1 drop Both Eyes QHS  . [START ON 08/19/2019] levothyroxine  50 mcg Oral QAC breakfast  . multivitamin-lutein  1 capsule Oral BID  . timolol  1  drop Both Eyes QHS  . tranexamic acid (CYKLOKAPRON) topical - INTRAOP  2,000 mg Topical Once   Continuous Infusions: . sodium chloride 75 mL/hr at 08/18/19 0038  .  ceFAZolin (ANCEF) IV       LOS: 1 day    Time spent: 33 mins     Charise Killian, MD [facility] Pager [phone]  If 7PM-7AM, please contact night-coverage [email or web address] 08/18/2019, 7:21 AM

## 2019-08-18 NOTE — Anesthesia Preprocedure Evaluation (Addendum)
Anesthesia Evaluation  Patient identified by MRN, date of birth, ID band Patient awake    Reviewed: Allergy & Precautions, NPO status , Patient's Chart, lab work & pertinent test results  History of Anesthesia Complications Negative for: history of anesthetic complications  Airway Mallampati: III  TM Distance: >3 FB Neck ROM: Full    Dental no notable dental hx. (+) Dental Advisory Given, Teeth Intact   Pulmonary neg pulmonary ROS, neg sleep apnea, neg COPD, Patient abstained from smoking.Not current smoker, former smoker,    Pulmonary exam normal - rhonchi (-) wheezing      Cardiovascular Exercise Tolerance: Good METShypertension, Pt. on medications (-) CAD and (-) Past MI Normal cardiovascular exam+ dysrhythmias Atrial Fibrillation + Valvular Problems/Murmurs MR  Rhythm:Irregular Rate:Normal - Systolic murmurs and - Diastolic murmurs 2D echocardiogram 03/11/2018 revealed normal left ventricular function, with LVEF greater than 55%, with moderate mitral regurgitation.  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 No date: Dysrhythmia     Comment:  AFIB No date: HOH (hard of hearing) No date: Hypertension No date: Hypothyroidism No date: Nontraumatic subdural hematoma (HCC) No date: Tumor     Comment:  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  Reproductive/Obstetrics                            Anesthesia Physical  Anesthesia Plan  ASA: II  Anesthesia Plan:  General/Spinal   Post-op Pain Management:    Induction: Intravenous  PONV Risk Score and Plan: 3 and Ondansetron, Dexamethasone, Propofol infusion and TIVA  Airway Management Planned: Natural Airway  Additional Equipment: None  Intra-op Plan:   Post-operative Plan:   Informed Consent: I have reviewed the patients History and Physical, chart, labs and discussed the procedure including the risks, benefits and alternatives for the proposed anesthesia with the patient or authorized representative who has indicated his/her understanding and acceptance.       Plan Discussed with: CRNA and Surgeon  Anesthesia Plan Comments: (Discussed R/B/A of neuraxial anesthesia technique with patient: - rare risks of spinal/epidural hematoma, nerve damage, infection - Risk of PDPH - Risk of nausea and vomiting - Risk of conversion to general anesthesia and its associated risks, including sore throat, damage to lips/teeth/oropharynx, and rare risks such as cardiac and respiratory events.  Patient voiced understanding.)        Anesthesia Quick Evaluation

## 2019-08-18 NOTE — Anesthesia Procedure Notes (Signed)
Spinal  Patient location during procedure: OR Start time: 08/18/2019 12:46 PM End time: 08/18/2019 12:56 PM Staffing Performed: anesthesiologist  Anesthesiologist: Arita Miss, MD Preanesthetic Checklist Completed: patient identified, IV checked, site marked, risks and benefits discussed, surgical consent, monitors and equipment checked, pre-op evaluation and timeout performed Spinal Block Patient position: right lateral decubitus Prep: Betadine Patient monitoring: heart rate, continuous pulse ox, blood pressure and cardiac monitor Approach: midline Location: L3-4 Injection technique: single-shot Needle Needle type: Introducer and Quincke  Needle gauge: 22 G Needle length: 9 cm Additional Notes Negative paresthesia. Negative blood return. Positive free-flowing CSF. Expiration date of kit checked and confirmed. Patient tolerated procedure well, without complications.

## 2019-08-18 NOTE — Op Note (Signed)
DATE OF SURGERY:  08/18/2019  TIME: 2:19 PM  PATIENT NAME:  Jeffrey Ashley  AGE: 86 y.o.  PRE-OPERATIVE DIAGNOSIS:  left hip fx  POST-OPERATIVE DIAGNOSIS:  SAME  PROCEDURE:  LEFT INTRAMEDULLARY (IM) NAIL INTERTROCHANTRIC  SURGEON:  Lyndle Herrlich  EBL:  50 cc  COMPLICATIONS:  None apparent  OPERATIVE IMPLANTS: Synthes trochanteric femoral nail  400 mm by 9 mm  with interlocking helical blade  105 mm  PREOPERATIVE INDICATIONS:  Jeffrey Ashley is a 86 y.o. year old who fell and suffered a hip fracture. He was brought into the ER and then admitted and optimized and then elected for surgical intervention.    The risks benefits and alternatives were discussed with the patient including but not limited to the risks of nonoperative treatment, versus surgical intervention including infection, bleeding, nerve injury, malunion, nonunion, hardware prominence, hardware failure, need for hardware removal, blood clots, cardiopulmonary complications, morbidity, mortality, among others, and they were willing to proceed.    OPERATIVE PROCEDURE:  The patient was brought to the operating room and placed in the supine position.  Spinal anesthesia was administered, with a foley. He was placed on the fracture table.  Closed reduction was performed under C-arm guidance. The length of the femur was also measured using fluoroscopy. Time out was then performed after sterile prep and drape. He received preoperative antibiotics.  Incision was made proximal to the greater trochanter. A guidewire was placed in the appropriate position. Confirmation was made on AP and lateral views. The above-named nail was opened. I opened the proximal femur with a reamer. I then placed the nail by hand easily down. I did not need to ream the femur.  Once the nail was completely seated, I placed a guidepin into the femoral head into the center center position through a second incision.  I measured the length, and then reamed  the lateral cortex and up into the head. I then placed the helical blade. Slight compression was applied. Anatomic fixation achieved. Bone quality was poor.  I then secured the proximal interlock.  I then removed the instruments, and took final C-arm pictures AP and lateral the entire length of the leg. Anatomic reconstruction was achieved, and the wounds were irrigated copiously and closed with Vicryl  followed by staples and dry sterile dressing. Sponge and needle count were correct.   The patient was awakened and returned to PACU in stable and satisfactory condition. There no complications and the patient tolerated the procedure well.  He will be weightbearing as tolerated.    Lyndle Herrlich

## 2019-08-18 NOTE — Progress Notes (Addendum)
Pt with elevating BP: 1700: 152/114, HR:101 Automatic 17:48:165/98, HR: 98 Automatic 17:59: 150/90 manual. MD notified. MD put order on [facility] for PRN IV Hydralazine.  Will administer appropriate dose. per MD's order.

## 2019-08-18 NOTE — Plan of Care (Signed)
  Problem: Health Behavior/Discharge Planning: Goal: Ability to manage health-related needs will improve Outcome: Progressing   Problem: Clinical Measurements: Goal: Ability to maintain clinical measurements within normal limits will improve Outcome: Progressing Goal: Will remain free from infection Outcome: Progressing Goal: Diagnostic test results will improve Outcome: Progressing Goal: Respiratory complications will improve Outcome: Progressing   Problem: Activity: Goal: Risk for activity intolerance will decrease Outcome: Progressing   Problem: Nutrition: Goal: Adequate nutrition will be maintained Outcome: Progressing   Problem: Coping: Goal: Level of anxiety will decrease Outcome: Progressing   Problem: Elimination: Goal: Will not experience complications related to bowel motility Outcome: Progressing Goal: Will not experience complications related to urinary retention Outcome: Progressing   Problem: Pain Managment: Goal: General experience of comfort will improve Outcome: Progressing   Problem: Safety: Goal: Ability to remain free from injury will improve Outcome: Progressing   Problem: Skin Integrity: Goal: Risk for impaired skin integrity will decrease Outcome: Progressing   Problem: Education: Goal: Understanding of discharge needs will improve Outcome: Progressing   Problem: Activity: Goal: Ability to avoid complications of mobility impairment will improve Outcome: Progressing Goal: Ability to tolerate increased activity will improve Outcome: Progressing   Problem: Clinical Measurements: Goal: Postoperative complications will be avoided or minimized Outcome: Progressing   Problem: Pain Management: Goal: Pain level will decrease with appropriate interventions Outcome: Progressing   Problem: Skin Integrity: Goal: Will show signs of wound healing Outcome: Progressing

## 2019-08-18 NOTE — Transfer of Care (Signed)
Immediate Anesthesia Transfer of Care Note  Patient: Jeffrey Ashley  Procedure(s) Performed: INTRAMEDULLARY (IM) NAIL INTERTROCHANTRIC (Left Hip)  Patient Location: PACU  Anesthesia Type:General  Level of Consciousness: sedated  Airway & Oxygen Therapy: Patient Spontanous Breathing and Patient connected to nasal cannula oxygen  Post-op Assessment: Report given to RN and Post -op Vital signs reviewed and stable  Post vital signs: Reviewed and stable  Last Vitals:  Vitals Value Taken Time  BP 122/85 08/18/19 1409  Temp    Pulse 112 08/18/19 1417  Resp 19 08/18/19 1417  SpO2 96 % 08/18/19 1417  Vitals shown include unvalidated device data.  Last Pain:  Vitals:   08/18/19 1105  TempSrc: Temporal  PainSc: 4          Complications: No apparent anesthesia complications

## 2019-08-18 NOTE — Progress Notes (Signed)
Initial Nutrition Assessment  DOCUMENTATION CODES:   Not applicable  INTERVENTION:   Ensure Enlive po BID, each supplement provides 350 kcal and 20 grams of protein  MVI daily  Recommend Oscal w/ D po BID  NUTRITION DIAGNOSIS:   Increased nutrient needs related to hip fracture as evidenced by increased estimated needs.  GOAL:   Patient will meet greater than or equal to 90% of their needs  MONITOR:   PO intake, Supplement acceptance, Labs, Weight trends, Skin, I & O's  REASON FOR ASSESSMENT:   Consult Hip fracture protocol  ASSESSMENT:   86 y.o. male with medical history significant for atrial fibrillation on aspirin, hypertension, hypothyroidism and glaucoma, who is status post cataract surgery yesterday on 08/16/2019 who presents to the emergency room following a fall and found to have L hip fracture   Unable to see patient today as patient in procedure at time of RD visit. Pt with increased estimated needs r/t hip fracture. RD will add supplements and vitamins to help pt meet his estimated needs. Per chart, pt appears fairly weight stable at baseline. RD will obtain nutrition related history and exam at follow-up.   Medications reviewed and include: aspirin, colace, lovenox, synthroid, ocuvite, NaCl @75ml /hr, cefazolin  Labs reviewed: BUN 24(H)  NUTRITION - FOCUSED PHYSICAL EXAM: Unable to perform at this time   Diet Order:   Diet Order            Diet NPO time specified  Diet effective midnight             EDUCATION NEEDS:   Not appropriate for education at this time  Skin:  Skin Assessment: Reviewed RN Assessment(skin tears, incision eye)  Last BM:  5/18  Height:   Ht Readings from Last 1 Encounters:  08/18/19 6' (1.829 m)    Weight:   Wt Readings from Last 1 Encounters:  08/18/19 80.7 kg    Ideal Body Weight:  80.9 kg  BMI:  Body mass index is 24.14 kg/m.  Estimated Nutritional Needs:   Kcal:  2000-2300kcal/day  Protein:   100-115g/day  Fluid:  >2L/day  08/20/19 MS, RD, LDN Please refer to [facility] for RD and/or RD on-call/weekend/after hours pager

## 2019-08-19 LAB — CBC
HCT: 41 % (ref 39.0–52.0)
Hemoglobin: 13.6 g/dL (ref 13.0–17.0)
MCH: 31.1 pg (ref 26.0–34.0)
MCHC: 33.2 g/dL (ref 30.0–36.0)
MCV: 93.8 fL (ref 80.0–100.0)
Platelets: 144 10*3/uL — ABNORMAL LOW (ref 150–400)
RBC: 4.37 MIL/uL (ref 4.22–5.81)
RDW: 13.2 % (ref 11.5–15.5)
WBC: 9.5 10*3/uL (ref 4.0–10.5)
nRBC: 0 % (ref 0.0–0.2)

## 2019-08-19 LAB — BASIC METABOLIC PANEL
Anion gap: 7 (ref 5–15)
BUN: 26 mg/dL — ABNORMAL HIGH (ref 8–23)
CO2: 25 mmol/L (ref 22–32)
Calcium: 8.3 mg/dL — ABNORMAL LOW (ref 8.9–10.3)
Chloride: 104 mmol/L (ref 98–111)
Creatinine, Ser: 0.76 mg/dL (ref 0.61–1.24)
GFR calc Af Amer: >60 mL/min (ref >60)
GFR calc non Af Amer: >60 mL/min (ref >60)
Glucose, Bld: 127 mg/dL — ABNORMAL HIGH (ref 70–99)
Potassium: 4.3 mmol/L (ref 3.5–5.1)
Sodium: 136 mmol/L (ref 135–145)

## 2019-08-19 MED ORDER — CARVEDILOL 12.5 MG PO TABS
12.5000 mg | ORAL_TABLET | Freq: Two times a day (BID) | ORAL | Status: DC
  Administered 2019-08-19 – 2019-08-20 (×3): 12.5 mg via ORAL
  Filled 2019-08-19 (×3): qty 1

## 2019-08-19 NOTE — Progress Notes (Signed)
Physical Therapy Treatment Patient Details Name: Jeffrey Ashley MRN: [id number] DOB: November 08, 1926 Today's Date: 08/19/2019    History of Present Illness 86 y/o male s/p fall with L hip fx.  S/p IM nailing 5/19, recent R cataract surgery 5/17.    PT Comments    Pt continues to have expected post-op pain, but did much better with PT session this afternoon, most notably with WBing/standing tolerance.  He was able to take multiple actually foot clearing steps bilaterally and though he was reliant on the walker and needed constant cuing he did not have nearly the unsteadiness or hesitancy as this AM session.  Pt also showed improved strength with most LE exericses, though quality of motion and consistency (especially with quad) with muscle engagement and quality of motion remains limited.  HR appeared to stay more stable this session, <100 bpm when checked during after standing acts, O2 did drop to the mid 80s with the standing effort, quickly came back up to high 90s on room air in supine.   Follow Up Recommendations  SNF;Supervision for mobility/OOB     Equipment Recommendations  (family member can loan FWW)    Recommendations for Other Services       Precautions / Restrictions Precautions Precautions: Fall Restrictions LLE Weight Bearing: Weight bearing as tolerated    Mobility  Bed Mobility Overal bed mobility: Needs Assistance Bed Mobility: Supine to Sit;Sit to Supine     Supine to sit: Min assist Sit to supine: Mod assist   General bed mobility comments: With some cuing to get to/from supine to sidelying but was able to do a majority of the transition to sitting with heavy rail use, more assist to get LEs back into bed post standing acts  Transfers Overall transfer level: Needs assistance Equipment used: Rolling walker (2 wheeled) Transfers: Sit to/from Stand Sit to Stand: Min assist         General transfer comment: Pt still requiring heavy cuing to insure  appropraite set up/hand placement/sequencing but was able to show relatively good effort in getting to standing needing only min assist to keep weight forward, less control returning to sitting  Ambulation/Gait Ambulation/Gait assistance: Min assist Gait Distance (Feet): 6 Feet Assistive device: Rolling walker (2 wheeled)       General Gait Details: Pt remains limited but did much better than the effort this AM.  He was able to clear foot and actually step with each foot for a few feet foward and back.  Pt continues to rely heavily on walker, need constant cuing and close guard/min assist but did not have any LOBs and despite some fatigue he did much better with WBing/standing acts and had much less apparent pain and guarding this afternoon.   Stairs             Wheelchair Mobility    Modified Rankin (Stroke Patients Only)       Balance Overall balance assessment: Needs assistance   Sitting balance-Leahy Scale: Fair       Standing balance-Leahy Scale: Fair Standing balance comment: still requiring constant light assist, but much better effort and overall safety in standing this session                            Cognition Arousal/Alertness: Awake/alert Behavior During Therapy: WFL for tasks assessed/performed Overall Cognitive Status: Within Functional Limits for tasks assessed  Exercises General Exercises - Lower Extremity Quad Sets: Strengthening;10 reps Short Arc Quad: AAROM;AROM;10 reps Heel Slides: 10 reps;AAROM;AROM(resisted leg extensions) Hip ABduction/ADduction: AROM;Strengthening;10 reps    General Comments        Pertinent Vitals/Pain Pain Score: 5  Pain Location: L hip    Home Living                      Prior Function            PT Goals (current goals can now be found in the care plan section) Progress towards PT goals: Progressing toward goals     Frequency    BID      PT Plan Current plan remains appropriate    Co-evaluation              AM-PAC PT "6 Clicks" Mobility   Outcome Measure  Help needed turning from your back to your side while in a flat bed without using bedrails?: A Little Help needed moving from lying on your back to sitting on the side of a flat bed without using bedrails?: A Lot Help needed moving to and from a bed to a chair (including a wheelchair)?: A Lot Help needed standing up from a chair using your arms (e.g., wheelchair or bedside chair)?: A Lot Help needed to walk in hospital room?: A Lot Help needed climbing 3-5 steps with a railing? : Total 6 Click Score: 12    End of Session Equipment Utilized During Treatment: Gait belt Activity Tolerance: Patient limited by pain;Patient limited by fatigue;Patient tolerated treatment well Patient left: with call bell/phone within reach;with family/visitor present;with bed alarm set Nurse Communication: Mobility status PT Visit Diagnosis: Muscle weakness (generalized) (M62.81);Difficulty in walking, not elsewhere classified (R26.2)     Time: 8185-6314 PT Time Calculation (min) (ACUTE ONLY): 25 min  Charges:  $Gait Training: 8-22 mins $Therapeutic Exercise: 8-22 mins                     Kreg Shropshire, DPT 08/19/2019, 5:09 PM

## 2019-08-19 NOTE — NC FL2 (Signed)
[place] LEVEL OF CARE SCREENING TOOL     IDENTIFICATION  Patient Name: Jeffrey Ashley Birthdate: 05-25-1926 Sex: male Admission Date (Current Location): 08/17/2019  [place] and [place] Number:  Engineering geologist and Address:  [facility], [address], [place], [place] [address]      Provider Number: [id number]  Attending Physician Name and Address:  Wyvonnia Dusky, MD  Relative Name and Phone Number:  Gerald Stabs SOn [phone]    Current Level of Care: Hospital Recommended Level of Care: [place] Prior Approval Number:    Date Approved/Denied:   PASRR Number: [id number]  Discharge Plan:      Current Diagnoses: Patient Active Problem List   Diagnosis Date Noted  . Atrial fibrillation, chronic ([place]) 08/17/2019  . Essential hypertension 08/17/2019  . Hypothyroidism 08/17/2019  . Fall at home, initial encounter 08/17/2019  . Closed left femoral fracture ([place]) 08/17/2019  . Preoperative clearance 08/17/2019  . S/P cataract extraction 08/17/2019  . Closed left hip fracture, initial encounter ([place]) 08/17/2019    Orientation RESPIRATION BLADDER Height & Weight     Self, Time, Situation, Place  Normal Continent Weight: 80.7 kg Height:  6' (182.9 cm)  BEHAVIORAL SYMPTOMS/MOOD NEUROLOGICAL BOWEL NUTRITION STATUS      Continent Diet(Heart Healthy)  AMBULATORY STATUS COMMUNICATION OF NEEDS Skin   Extensive Assist Verbally Surgical wounds                       Personal Care Assistance Level of Assistance  Bathing, Dressing Bathing Assistance: Limited assistance   Dressing Assistance: Limited assistance     Functional Limitations Info             SPECIAL CARE FACTORS FREQUENCY  PT (By licensed PT)     PT Frequency: 5 times per week              Contractures Contractures Info: Not present    Additional Factors Info  Code Status, Allergies Code Status Info: DNR Allergies Info:  Brimonidine Tartrate, Combigan           Current Medications (08/19/2019):  This is the current hospital active medication list Current Facility-Administered Medications  Medication Dose Route Frequency Provider Last Rate Last Admin  . acetaminophen (TYLENOL) tablet 325-650 mg  325-650 mg Oral Q6H PRN Lovell Sheehan, MD      . acetaminophen (TYLENOL) tablet 500 mg  500 mg Oral Q6H Lovell Sheehan, MD   500 mg at 08/19/19 [phone]  . aspirin EC tablet 81 mg  81 mg Oral Daily Lovell Sheehan, MD      . carvedilol (COREG) tablet 12.5 mg  12.5 mg Oral BID Wyvonnia Dusky, MD      . ceFAZolin (ANCEF) IVPB 1 g/50 mL premix  1 g Intravenous Q6H Lovell Sheehan, MD 100 mL/hr at 08/19/19 0108 1 g at 08/19/19 0108  . docusate sodium (COLACE) capsule 100 mg  100 mg Oral BID Lovell Sheehan, MD   100 mg at 08/18/19 2239  . enoxaparin (LOVENOX) injection 40 mg  40 mg Subcutaneous Q24H Lovell Sheehan, MD   40 mg at 08/18/19 2242  . feeding supplement (ENSURE ENLIVE) (ENSURE ENLIVE) liquid 237 mL  237 mL Oral TID BM Lovell Sheehan, MD      . hydrALAZINE (APRESOLINE) injection 10 mg  10 mg Intravenous Q6H PRN Wyvonnia Dusky, MD   10 mg at 08/18/19 1828  .  HYDROcodone-acetaminophen (NORCO) 7.5-325 MG per tablet 1-2 tablet  1-2 tablet Oral Q4H PRN Lyndle Herrlich, MD      . HYDROcodone-acetaminophen (NORCO/VICODIN) 5-325 MG per tablet 1-2 tablet  1-2 tablet Oral Q4H PRN Lyndle Herrlich, MD      . ketorolac (TORADOL) 15 MG/ML injection 7.5 mg  7.5 mg Intravenous Q6H Lyndle Herrlich, MD   7.5 mg at 08/19/19 7989  . lactated ringers infusion   Intravenous Continuous Lyndle Herrlich, MD   Stopped at 08/19/19 [phone]  . latanoprost (XALATAN) 0.005 % ophthalmic solution 1 drop  1 drop Left Eye QHS Charise Killian, MD   1 drop at 08/18/19 2241  . levothyroxine (SYNTHROID) tablet 50 mcg  50 mcg Oral QAC breakfast Lyndle Herrlich, MD      . metoCLOPramide (REGLAN) tablet 5-10 mg  5-10 mg Oral Q8H PRN Lyndle Herrlich, MD       Or  . metoCLOPramide (REGLAN) injection 5-10 mg  5-10 mg Intravenous Q8H PRN Lyndle Herrlich, MD      . morphine 2 MG/ML injection 0.5-1 mg  0.5-1 mg Intravenous Q2H PRN Lyndle Herrlich, MD      . multivitamin with minerals tablet 1 tablet  1 tablet Oral Daily Lyndle Herrlich, MD      . multivitamin-lutein (OCUVITE-LUTEIN) capsule 1 capsule  1 capsule Oral BID Lyndle Herrlich, MD   1 capsule at 08/18/19 2240  . NONFORMULARY OR COMPOUNDED ITEM 1 drop  1 drop Right Eye QID Charise Killian, MD   1 drop at 08/18/19 2242  . ondansetron (ZOFRAN) tablet 4 mg  4 mg Oral Q6H PRN Lyndle Herrlich, MD       Or  . ondansetron [facility]) injection 4 mg  4 mg Intravenous Q6H PRN Lyndle Herrlich, MD      . timolol (TIMOPTIC) 0.5 % ophthalmic solution 1 drop  1 drop Left Eye QHS Charise Killian, MD   1 drop at 08/18/19 2240  . tranexamic acid (CYKLOKAPRON) 2,000 mg in sodium chloride 0.9 % 50 mL Topical Application  2,000 mg Topical Once Lyndle Herrlich, MD         Discharge Medications: Please see discharge summary for a list of discharge medications.  Relevant Imaging Results:  Relevant Lab Results:   Additional Information SS# [id number]  Barrie Dunker, RN

## 2019-08-19 NOTE — Evaluation (Signed)
Physical Therapy Evaluation Patient Details Name: Jeffrey Ashley MRN: [id number] DOB: 30-Dec-1926 Today's Date: 08/19/2019   History of Present Illness  86 y/o male s/p fall with L hip fx.  S/p IM nailing 5/19, recent R cataract surgery 5/17.  Clinical Impression  Pt eager to work with PT and try to see what he could do, however he did struggle with most aspects of POD1 PT.  He had pain with most A/AAROM exercises involving the L leg (minimal hip and knee ROM tolerance) and regularly had poor quality of motion while going through minimal ranges; this being said he showed great effort and determination with exercises despite needing some extra time/cuing/reinforcement.  He also showed good effort with mobility/transfers, but did need direct assist and extra cuing as well.  However his biggest functional limitations are related to gait.  He very much struggled with clearing/stepping either LE and despite heavy cuing from PT, heavy use of walker and unweighting assist from PT he could only heel-toe minimally with each attempt, very labored and difficult attempt at "gait", HR was in the 100s t/o the effort and up to 140 at times.  Pt not overly symptomatic regarding cardiovascular status apart from general expected fatigue from getting up/moving post surgery.  Follow Up Recommendations SNF;Supervision for mobility/OOB    Equipment Recommendations  (niece has RW when needed)    Recommendations for Other Services       Precautions / Restrictions Precautions Precautions: Fall Restrictions LLE Weight Bearing: Weight bearing as tolerated      Mobility  Bed Mobility Overal bed mobility: Needs Assistance Bed Mobility: Supine to Sit     Supine to sit: Min assist     General bed mobility comments: Pt showed good effort in trying to get to EOB using rail but ultimately did need direct assist to attain EOB sitting  Transfers Overall transfer level: Needs assistance Equipment used: Rolling  walker (2 wheeled) Transfers: Sit to/from Stand Sit to Stand: Min assist         General transfer comment: Pt unable to rise with self selected positioning and minimal cuing, but with elevated bed height further cuing for set up/sequencing/hand placement he was able to rise with only light assist.  Ambulation/Gait Ambulation/Gait assistance: Mod assist Gait Distance (Feet): 2 Feet Assistive device: Rolling walker (2 wheeled)       General Gait Details: Pt with very limited tolerance to standing/WBing.  Pt highly reliant on walker just to stay upright and needed great effort just to minimally heel-toe, shuffle from bed to recliner.  This took a lot of effort, plenty of cuing as well as unweighting assist from PT and ultimately was quite a struggle for the patient.  HR also elevated with the effort, 110s to 140s with very modest activity.   Stairs            Wheelchair Mobility    Modified Rankin (Stroke Patients Only)       Balance Overall balance assessment: Needs assistance   Sitting balance-Leahy Scale: Fair       Standing balance-Leahy Scale: Poor Standing balance comment: Pt lacking much confidence at all in standing, highly reliant on the walker, very poor control with LE movement                              Pertinent Vitals/Pain Pain Assessment: 0-10 Pain Score: 6  Pain Location: L hip    Home Living  Family/patient expects to be discharged to:: Skilled nursing facility                 Additional Comments: has 4WW and cane, can borrow RW from family    Prior Function Level of Independence: Independent         Comments: Pt lives at [facility] independent living, does not drive but out regularly with family, does not use an AD most of the time     Hand Dominance        Extremity/Trunk Assessment   Upper Extremity Assessment Upper Extremity Assessment: Generalized weakness    Lower Extremity Assessment Lower Extremity  Assessment: Generalized weakness(pain limited with most L LE movement, hip grossly 3-/5)       Communication   Communication: No difficulties;HOH  Cognition Arousal/Alertness: Awake/alert Behavior During Therapy: WFL for tasks assessed/performed Overall Cognitive Status: Within Functional Limits for tasks assessed                                        General Comments      Exercises General Exercises - Lower Extremity Ankle Circles/Pumps: AROM;10 reps Quad Sets: Strengthening;10 reps Short Arc Quad: AROM;10 reps(poor quality of motion/quad control on L) Heel Slides: AROM;10 reps(pain limited outside modest ROM) Hip ABduction/ADduction: AAROM;AROM;10 reps(pain limited outside very modest ROM) Straight Leg Raises: (unable to attempt, much pain with AAROM)   Assessment/Plan    PT Assessment Patient needs continued PT services  PT Problem List Decreased strength;Decreased range of motion;Decreased activity tolerance;Decreased mobility       PT Treatment Interventions DME instruction;Gait training;Functional mobility training;Therapeutic activities;Therapeutic exercise;Balance training;Patient/family education    PT Goals (Current goals can be found in the Care Plan section)  Acute Rehab PT Goals Patient Stated Goal: get back to walking  PT Goal Formulation: With patient Time For Goal Achievement: 09/16/19 Potential to Achieve Goals: Fair    Frequency Min 2X/week   Barriers to discharge        Co-evaluation               AM-PAC PT "6 Clicks" Mobility  Outcome Measure Help needed turning from your back to your side while in a flat bed without using bedrails?: A Little Help needed moving from lying on your back to sitting on the side of a flat bed without using bedrails?: A Lot Help needed moving to and from a bed to a chair (including a wheelchair)?: A Lot Help needed standing up from a chair using your arms (e.g., wheelchair or bedside chair)?: A  Lot Help needed to walk in hospital room?: Total Help needed climbing 3-5 steps with a railing? : Total 6 Click Score: 11    End of Session Equipment Utilized During Treatment: Gait belt Activity Tolerance: Patient limited by pain;Patient limited by fatigue;Patient tolerated treatment well Patient left: with chair alarm set;with call bell/phone within reach;with family/visitor present Nurse Communication: Mobility status PT Visit Diagnosis: Muscle weakness (generalized) (M62.81);Difficulty in walking, not elsewhere classified (R26.2)    Time: 5643-3295 PT Time Calculation (min) (ACUTE ONLY): 53 min   Charges:   PT Evaluation $PT Eval Low Complexity: 1 Low PT Treatments $Therapeutic Exercise: 8-22 mins $Therapeutic Activity: 8-22 mins        Kreg Shropshire, DPT 08/19/2019, 12:53 PM

## 2019-08-19 NOTE — Progress Notes (Signed)
OT Cancellation Note  Patient Details Name: Jeffrey Ashley MRN: [id number] DOB: 1926-06-23   Cancelled Treatment:    Reason Eval/Treat Not Completed: Patient declined, no reason specified   OT order received.  Attempted to engage pt in OT evaluation, but pt reports feeling tired and said "come back later".  OTR explained OT role, plan of care, benefits of therapy, and told pt it likely would not be until tomorrow that OT could return.  Pt replied, "I'm sleeping".  Will continue to follow up at next opportunity.  Kathyrn Drown Eytan Carrigan, OTR/L 08/19/19, 1:49 PM

## 2019-08-19 NOTE — Progress Notes (Signed)
Subjective:  Patient reports pain as mild.  Doing well.  Objective:   VITALS:   Vitals:   08/18/19 2234 08/18/19 2236 08/19/19 0016 08/19/19 0439  BP: (!) 152/101 (!) 147/82 (!) 140/93 (!) 149/107  Pulse: (!) 108 100 (!) 104 (!) 101  Resp:    17  Temp:   98.4 F (36.9 C) 97.7 F (36.5 C)  TempSrc:   Oral Oral  SpO2: 94% 94% 96% 94%  Weight:      Height:        PHYSICAL EXAM:  Neurologically intact ABD soft Neurovascular intact Sensation intact distally Intact pulses distally Dorsiflexion/Plantar flexion intact Incision: dressing C/D/I No cellulitis present Compartment soft  LABS  Results for orders placed or performed during the hospital encounter of 08/17/19 (from the past 24 hour(s))  Type and screen [facility]     Status: None   Collection Time: 08/18/19  9:46 AM  Result Value Ref Range   ABO/RH(D) O POS    Antibody Screen NEG    Sample Expiration      08/21/2019,2359 Performed at [facility], [address] [place], [place] [address]   CBC     Status: Abnormal   Collection Time: 08/18/19  5:27 PM  Result Value Ref Range   WBC 11.0 (H) 4.0 - 10.5 K/uL   RBC 4.54 4.22 - 5.81 MIL/uL   Hemoglobin 14.2 13.0 - 17.0 g/dL   HCT 19.4 17.4 - 08.1 %   MCV 92.5 80.0 - 100.0 fL   MCH 31.3 26.0 - 34.0 pg   MCHC 33.8 30.0 - 36.0 g/dL   RDW 44.8 18.5 - 63.1 %   Platelets 155 150 - 400 K/uL   nRBC 0.0 0.0 - 0.2 %  Creatinine, serum     Status: None   Collection Time: 08/18/19  5:27 PM  Result Value Ref Range   Creatinine, Ser 0.84 0.61 - 1.24 mg/dL   GFR calc non Af Amer >60 >60 mL/min   GFR calc Af Amer >60 >60 mL/min    DG Elbow 2 Views Left  Result Date: 08/17/2019 CLINICAL DATA:  Fall EXAM: LEFT ELBOW - 2 VIEW COMPARISON:  None. FINDINGS: There is no evidence of fracture, dislocation, or joint effusion. There is no evidence of arthropathy or other focal bone abnormality. Soft tissues are unremarkable. IMPRESSION: Negative.  Electronically Signed   By: Jasmine Pang M.D.   On: 08/17/2019 20:25   CT Head Wo Contrast  Result Date: 08/17/2019 CLINICAL DATA:  Status post fall. EXAM: CT HEAD WITHOUT CONTRAST TECHNIQUE: Contiguous axial images were obtained from the base of the skull through the vertex without intravenous contrast. COMPARISON:  July 07, 2016 FINDINGS: Brain: There is mild cerebral atrophy with widening of the extra-axial spaces and ventricular dilatation. There are areas of decreased attenuation within the white matter tracts of the supratentorial brain, consistent with microvascular disease changes. Vascular: No hyperdense vessel or unexpected calcification. Skull: Right parietal and right frontoparietal burr holes are seen. Sinuses/Orbits: There is marked severity bilateral ethmoid sinus and left maxillary sinus mucosal thickening. Other: None. IMPRESSION: 1. Generalized cerebral atrophy. 2. No acute intracranial abnormality. 3. Marked severity bilateral ethmoid sinus and left maxillary sinus disease. Electronically Signed   By: Aram Candela M.D.   On: 08/17/2019 20:10   CT Cervical Spine Wo Contrast  Result Date: 08/17/2019 CLINICAL DATA:  Status post fall. EXAM: CT CERVICAL SPINE WITHOUT CONTRAST TECHNIQUE: Multidetector CT imaging of the cervical spine was  performed without intravenous contrast. Multiplanar CT image reconstructions were also generated. COMPARISON:  None. FINDINGS: Alignment: Normal. Skull base and vertebrae: No acute fracture. No primary bone lesion or focal pathologic process. Soft tissues and spinal canal: No prevertebral fluid or swelling. No visible canal hematoma. Disc levels: Moderate severity multilevel endplate sclerosis is seen throughout the cervical spine with moderate to marked severity multilevel intervertebral disc space narrowing. Moderate severity bilateral multilevel facet joint hypertrophy is noted. Upper chest: Negative. Other: None. IMPRESSION: 1. No acute osseous  abnormality of the cervical spine. 2. Moderate to marked severity multilevel degenerative changes. Electronically Signed   By: Virgina Norfolk M.D.   On: 08/17/2019 20:16   DG Chest Portable 1 View  Result Date: 08/17/2019 CLINICAL DATA:  Status post fall. EXAM: PORTABLE CHEST 1 VIEW COMPARISON:  March 14, 2013 FINDINGS: There is no evidence of acute infiltrate, pleural effusion or pneumothorax. The cardiac silhouette is markedly enlarged. There is moderate severity calcification of the aortic arch. The visualized skeletal structures are unremarkable. IMPRESSION: Cardiomegaly without evidence of acute or active cardiopulmonary disease. Electronically Signed   By: Virgina Norfolk M.D.   On: 08/17/2019 20:28   DG HIP OPERATIVE UNILAT W OR W/O PELVIS LEFT  Result Date: 08/18/2019 CLINICAL DATA:  Intramedullary nail fixation, left hip intratrochanteric fracture EXAM: OPERATIVE LEFT HIP (WITH PELVIS IF PERFORMED) 2 VIEWS TECHNIQUE: Fluoroscopic spot image(s) were submitted for interpretation post-operatively. COMPARISON:  08/17/2019 FINDINGS: Intraoperative fluoroscopic images demonstrate intramedullary nail fixation of an intratrochanteric fracture of the proximal left femur, with near anatomic alignment of fracture fragments. IMPRESSION: Intraoperative fluoroscopic images demonstrate intramedullary nail fixation of an intratrochanteric fracture of the proximal left femur, with near anatomic alignment of fracture fragments. Electronically Signed   By: Eddie Candle M.D.   On: 08/18/2019 14:10   DG Hip Unilat W or Wo Pelvis 2-3 Views Left  Result Date: 08/17/2019 CLINICAL DATA:  Status post fall. EXAM: DG HIP (WITH OR WITHOUT PELVIS) 2-3V LEFT COMPARISON:  None. FINDINGS: Acute fracture is seen involving the greater trochanter of the proximal left femur. There is possible involvement of the inter trochanteric region. There is no evidence of dislocation. There is no evidence of significant arthropathy or  other focal bone abnormality. Small phleboliths are seen within the pelvis on the left. IMPRESSION: Acute fracture of the proximal left femur. Electronically Signed   By: Virgina Norfolk M.D.   On: 08/17/2019 20:27    Assessment/Plan: 1 Day Post-Op   Principal Problem:   Closed left femoral fracture (HCC) Active Problems:   Atrial fibrillation, chronic ([place])   Essential hypertension   Hypothyroidism   Fall at home, initial encounter   Preoperative clearance   S/P cataract extraction   Closed left hip fracture, initial encounter ([place])   Advance diet Up with therapy  WBAT in LLE Continue pain control Lovenox 40mg  daily X 2 weeks Follow up in 2 weeks for staple removal, call for appointment [phone] Discharge per medicine when medically stable  Carlynn Spry , PA-C 08/19/2019, 7:24 AM

## 2019-08-19 NOTE — TOC Progression Note (Signed)
Transition of Care [facility]) - Progression Note    Patient Details  Name: Jeffrey Ashley MRN: [id number] Date of Birth: [birth year]  Transition of Care [facility]) CM/SW Contact  Barrie Dunker, RN Phone Number: 08/19/2019, 10:44 AM  Clinical Narrative:    Received a call from Sue Lush at [facility], they will accept the patient in the rehab section at [facility] as he is a resident in the independent living area         Expected Discharge Plan and Services                                                 Social Determinants of Health (SDOH) Interventions    Readmission Risk Interventions No flowsheet data found.

## 2019-08-19 NOTE — TOC Progression Note (Signed)
Transition of Care [facility]) - Progression Note    Patient Details  Name: Jeffrey Ashley MRN: [id number] Date of Birth: 04/13/1926  Transition [facility] Care [facility]) CM/SW Contact  Barrie Dunker, RN Phone Number: 08/19/2019, 9:15 AM  Clinical Narrative:    Patient lives alone, FL2, PASSR and bedsearch sent with anticipation that he will need to go to SNF, recent fall, cataract surgery 5/17, will review bed offers once obtained        Expected Discharge Plan and Services                                                 Social Determinants of Health (SDOH) Interventions    Readmission Risk Interventions No flowsheet data found.

## 2019-08-19 NOTE — Progress Notes (Signed)
PROGRESS NOTE    Jeffrey Ashley  XTK:[id number] DOB: 09-02-1926 DOA: 08/17/2019 PCP: Marguarite Arbour, MD       Assessment & Plan:   Principal Problem:   Closed left femoral fracture [facility]) Active Problems:   Atrial fibrillation, chronic (HCC)   Essential hypertension   Hypothyroidism   Fall at home, initial encounter   Preoperative clearance   S/P cataract extraction   Closed left hip fracture, initial encounter (HCC)   Closed left femoral fracture: secondary to fall at home. S/p left intramedullary nail intertrochantric as per ortho surg 08/18/19. WBAT. Lovenox 40mg  daily x 2 weeks for DVT prophylaxis as per ortho surg. PT/OT consulted   Glaucoma: continue on home dose of timolol & latanoprost eye drops.  S/p cataract extraction 08/16/19  Hypothyroidism: continue levothyroxine  Atrial fibrillation: chronic. Continue on carvedilol. Not on anticoagulation at home, unsure of reason not on anticoagulation, possible fall risk.   Essential hypertension: continue carvedilol   Thrombocytopenia: etiology unclear, mild. Will continue to monitor    DVT prophylaxis: lovenox Code Status: full  Family Communication: called pt's son, 08/18/19, no answer and unable to leave a message  Disposition Plan: depends on PT/OT recs    Consultants:   Ortho surg   Procedures:    Antimicrobials:   Subjective: Pt c/o malaise   Objective: Vitals:   08/19/19 0016 08/19/19 0439 08/19/19 0741 08/19/19 1141  BP: (!) 140/93 (!) 149/107 (!) 159/101 (!) 135/95  Pulse: (!) 104 (!) 101 (!) 110 64  Resp:  17 16 17   Temp: 98.4 F (36.9 C) 97.7 F (36.5 C) 98 F (36.7 C) 97.7 F (36.5 C)  TempSrc: Oral Oral Oral Oral  SpO2: 96% 94% 95% 97%  Weight:      Height:        Intake/Output Summary (Last 24 hours) at 08/19/2019 1253 Last data filed at 08/19/2019 0305 Gross per 24 hour  Intake 1731.3 ml  Output 325 ml  Net 1406.3 ml   Filed Weights   08/17/19 1919 08/18/19 0003  08/18/19 1105  Weight: 80.3 kg 80.7 kg 80.7 kg    Examination:  General exam: Appears calm and comfortable  Respiratory system: Clear to auscultation. No rhonchi  Cardiovascular system: Irregularly irrgeular. No rubs, gallops or clicks.  Gastrointestinal system: Abdomen is nondistended, soft and nontender. Normal bowel sounds heard. Central nervous system: Alert and oriented. Moves all 4 extremities  Psychiatry: Judgement and insight appear normal. Mood & affect appropriate.     Data Reviewed: I have personally reviewed following labs and imaging studies  CBC: Recent Labs  Lab 08/17/19 1920 08/18/19 0527 08/18/19 1727 08/19/19 0730  WBC 9.4 8.4 11.0* 9.5  NEUTROABS 7.1  --   --   --   HGB 14.2 14.1 14.2 13.6  HCT 42.3 41.1 42.0 41.0  MCV 91.8 91.3 92.5 93.8  PLT 155 156 155 144*   Basic Metabolic Panel: Recent Labs  Lab 08/17/19 1920 08/18/19 0527 08/18/19 1727 08/19/19 0730  NA 136 137  --  136  K 4.4 4.5  --  4.3  CL 103 104  --  104  CO2 25 25  --  25  GLUCOSE 137* 147*  --  127*  BUN 27* 24*  --  26*  CREATININE 0.97 0.74 0.84 0.76  CALCIUM 8.9 8.9  --  8.3*   GFR: Estimated Creatinine Clearance: 67.4 mL/min (by C-G formula based on SCr of 0.76 mg/dL). Liver Function Tests: No results for input(s): AST,  ALT, ALKPHOS, BILITOT, PROT, ALBUMIN in the last 168 hours. No results for input(s): LIPASE, AMYLASE in the last 168 hours. No results for input(s): AMMONIA in the last 168 hours. Coagulation Profile: No results for input(s): INR, PROTIME in the last 168 hours. Cardiac Enzymes: No results for input(s): CKTOTAL, CKMB, CKMBINDEX, TROPONINI in the last 168 hours. BNP (last 3 results) No results for input(s): PROBNP in the last 8760 hours. HbA1C: No results for input(s): HGBA1C in the last 72 hours. CBG: Recent Labs  Lab 08/16/19 0935  GLUCAP 102*   Lipid Profile: No results for input(s): CHOL, HDL, LDLCALC, TRIG, CHOLHDL, LDLDIRECT in the last 72  hours. Thyroid Function Tests: No results for input(s): TSH, T4TOTAL, FREET4, T3FREE, THYROIDAB in the last 72 hours. Anemia Panel: No results for input(s): VITAMINB12, FOLATE, FERRITIN, TIBC, IRON, RETICCTPCT in the last 72 hours. Sepsis Labs: No results for input(s): PROCALCITON, LATICACIDVEN in the last 168 hours.  Recent Results (from the past 240 hour(s))  SARS CORONAVIRUS 2 (TAT 6-24 HRS) Nasopharyngeal Nasopharyngeal Swab     Status: None   Collection Time: 08/12/19 10:59 AM   Specimen: Nasopharyngeal Swab  Result Value Ref Range Status   SARS Coronavirus 2 NEGATIVE NEGATIVE Final    Comment: (NOTE) SARS-CoV-2 target nucleic acids are NOT DETECTED. The SARS-CoV-2 RNA is generally detectable in upper and lower respiratory specimens during the acute phase of infection. Negative results do not preclude SARS-CoV-2 infection, do not rule out co-infections with other pathogens, and should not be used as the sole basis for treatment or other patient management decisions. Negative results must be combined with clinical observations, patient history, and epidemiological information. The expected result is Negative. Fact Sheet for Patients: SugarRoll.be Fact Sheet for Healthcare Providers: https://www.woods-mathews.com/ This test is not yet approved or cleared by the [place] FDA and  has been authorized for detection and/or diagnosis of SARS-CoV-2 by FDA under an Emergency Use Authorization (EUA). This EUA will remain  in effect (meaning this test can be used) for the duration of the COVID-19 declaration under Section 56 4(b)(1) of the Act, 21 U.S.C. section 360bbb-3(b)(1), unless the authorization is terminated or revoked sooner. Performed at [facility], [place] [address], [place], [place] [address]   SARS Coronavirus 2 by RT PCR (hospital order, performed in [facility] hospital lab) Nasopharyngeal Nasopharyngeal Swab     Status:  None   Collection Time: 08/17/19  7:20 PM   Specimen: Nasopharyngeal Swab  Result Value Ref Range Status   SARS Coronavirus 2 NEGATIVE NEGATIVE Final    Comment: (NOTE) SARS-CoV-2 target nucleic acids are NOT DETECTED. The SARS-CoV-2 RNA is generally detectable in upper and lower respiratory specimens during the acute phase of infection. The lowest concentration of SARS-CoV-2 viral copies this assay can detect is 250 copies / mL. A negative result does not preclude SARS-CoV-2 infection and should not be used as the sole basis for treatment or other patient management decisions.  A negative result may occur with improper specimen collection / handling, submission of specimen other than nasopharyngeal swab, presence of viral mutation(s) within the areas targeted by this assay, and inadequate number of viral copies (<250 copies / mL). A negative result must be combined with clinical observations, patient history, and epidemiological information. Fact Sheet for Patients:   StrictlyIdeas.no Fact Sheet for Healthcare Providers: BankingDealers.co.za This test is not yet approved or cleared  by the [place] FDA and has been authorized for detection and/or diagnosis of SARS-CoV-2 by  FDA under an Emergency Use Authorization (EUA).  This EUA will remain in effect (meaning this test can be used) for the duration of the COVID-19 declaration under Section 564(b)(1) of the Act, 21 U.S.C. section 360bbb-3(b)(1), unless the authorization is terminated or revoked sooner. Performed at [facility], [address], [place], [place] [address]   Surgical PCR screen     Status: None   Collection Time: 08/18/19 12:26 AM   Specimen: Nasal Mucosa; Nasal Swab  Result Value Ref Range Status   MRSA, PCR NEGATIVE NEGATIVE Final   Staphylococcus aureus NEGATIVE NEGATIVE Final    Comment: (NOTE) The Xpert SA Assay (FDA approved for NASAL specimens  in patients 22 years of age and older), is one component of a comprehensive surveillance program. It is not intended to diagnose infection nor to guide or monitor treatment. Performed at [facility], [address], [place], [place] [address]          Radiology Studies: DG Elbow 2 Views Left  Result Date: 08/17/2019 CLINICAL DATA:  Fall EXAM: LEFT ELBOW - 2 VIEW COMPARISON:  None. FINDINGS: There is no evidence of fracture, dislocation, or joint effusion. There is no evidence of arthropathy or other focal bone abnormality. Soft tissues are unremarkable. IMPRESSION: Negative. Electronically Signed   By: Jasmine Pang M.D.   On: 08/17/2019 20:25   CT Head Wo Contrast  Result Date: 08/17/2019 CLINICAL DATA:  Status post fall. EXAM: CT HEAD WITHOUT CONTRAST TECHNIQUE: Contiguous axial images were obtained from the base of the skull through the vertex without intravenous contrast. COMPARISON:  July 07, 2016 FINDINGS: Brain: There is mild cerebral atrophy with widening of the extra-axial spaces and ventricular dilatation. There are areas of decreased attenuation within the white matter tracts of the supratentorial brain, consistent with microvascular disease changes. Vascular: No hyperdense vessel or unexpected calcification. Skull: Right parietal and right frontoparietal burr holes are seen. Sinuses/Orbits: There is marked severity bilateral ethmoid sinus and left maxillary sinus mucosal thickening. Other: None. IMPRESSION: 1. Generalized cerebral atrophy. 2. No acute intracranial abnormality. 3. Marked severity bilateral ethmoid sinus and left maxillary sinus disease. Electronically Signed   By: Aram Candela M.D.   On: 08/17/2019 20:10   CT Cervical Spine Wo Contrast  Result Date: 08/17/2019 CLINICAL DATA:  Status post fall. EXAM: CT CERVICAL SPINE WITHOUT CONTRAST TECHNIQUE: Multidetector CT imaging of the cervical spine was performed without intravenous contrast. Multiplanar CT  image reconstructions were also generated. COMPARISON:  None. FINDINGS: Alignment: Normal. Skull base and vertebrae: No acute fracture. No primary bone lesion or focal pathologic process. Soft tissues and spinal canal: No prevertebral fluid or swelling. No visible canal hematoma. Disc levels: Moderate severity multilevel endplate sclerosis is seen throughout the cervical spine with moderate to marked severity multilevel intervertebral disc space narrowing. Moderate severity bilateral multilevel facet joint hypertrophy is noted. Upper chest: Negative. Other: None. IMPRESSION: 1. No acute osseous abnormality of the cervical spine. 2. Moderate to marked severity multilevel degenerative changes. Electronically Signed   By: Aram Candela M.D.   On: 08/17/2019 20:16   DG Chest Portable 1 View  Result Date: 08/17/2019 CLINICAL DATA:  Status post fall. EXAM: PORTABLE CHEST 1 VIEW COMPARISON:  March 14, 2013 FINDINGS: There is no evidence of acute infiltrate, pleural effusion or pneumothorax. The cardiac silhouette is markedly enlarged. There is moderate severity calcification of the aortic arch. The visualized skeletal structures are unremarkable. IMPRESSION: Cardiomegaly without evidence of acute or active cardiopulmonary disease. Electronically  Signed   By: Aram Candela M.D.   On: 08/17/2019 20:28   DG HIP OPERATIVE UNILAT W OR W/O PELVIS LEFT  Result Date: 08/18/2019 CLINICAL DATA:  Intramedullary nail fixation, left hip intratrochanteric fracture EXAM: OPERATIVE LEFT HIP (WITH PELVIS IF PERFORMED) 2 VIEWS TECHNIQUE: Fluoroscopic spot image(s) were submitted for interpretation post-operatively. COMPARISON:  08/17/2019 FINDINGS: Intraoperative fluoroscopic images demonstrate intramedullary nail fixation of an intratrochanteric fracture of the proximal left femur, with near anatomic alignment of fracture fragments. IMPRESSION: Intraoperative fluoroscopic images demonstrate intramedullary nail fixation  of an intratrochanteric fracture of the proximal left femur, with near anatomic alignment of fracture fragments. Electronically Signed   By: Lauralyn Primes M.D.   On: 08/18/2019 14:10   DG Hip Unilat W or Wo Pelvis 2-3 Views Left  Result Date: 08/17/2019 CLINICAL DATA:  Status post fall. EXAM: DG HIP (WITH OR WITHOUT PELVIS) 2-3V LEFT COMPARISON:  None. FINDINGS: Acute fracture is seen involving the greater trochanter of the proximal left femur. There is possible involvement of the inter trochanteric region. There is no evidence of dislocation. There is no evidence of significant arthropathy or other focal bone abnormality. Small phleboliths are seen within the pelvis on the left. IMPRESSION: Acute fracture of the proximal left femur. Electronically Signed   By: Aram Candela M.D.   On: 08/17/2019 20:27        Scheduled Meds: . aspirin EC  81 mg Oral Daily  . carvedilol  12.5 mg Oral BID  . docusate sodium  100 mg Oral BID  . enoxaparin (LOVENOX) injection  40 mg Subcutaneous Q24H  . feeding supplement (ENSURE ENLIVE)  237 mL Oral TID BM  . latanoprost  1 drop Left Eye QHS  . levothyroxine  50 mcg Oral QAC breakfast  . multivitamin with minerals  1 tablet Oral Daily  . multivitamin-lutein  1 capsule Oral BID  . NONFORMULARY OR COMPOUNDED ITEM 1 drop  1 drop Right Eye QID  . timolol  1 drop Left Eye QHS  . tranexamic acid (CYKLOKAPRON) topical - INTRAOP  2,000 mg Topical Once   Continuous Infusions: . lactated ringers Stopped (08/19/19 0442)     LOS: 2 days    Time spent: 31 mins     Charise Killian, MD [facility] Pager [phone]  If 7PM-7AM, please contact night-coverage [email or web address] 08/19/2019, 12:53 PM

## 2019-08-20 LAB — BASIC METABOLIC PANEL
Anion gap: 6 (ref 5–15)
BUN: 28 mg/dL — ABNORMAL HIGH (ref 8–23)
CO2: 25 mmol/L (ref 22–32)
Calcium: 8.5 mg/dL — ABNORMAL LOW (ref 8.9–10.3)
Chloride: 105 mmol/L (ref 98–111)
Creatinine, Ser: 0.78 mg/dL (ref 0.61–1.24)
GFR calc Af Amer: >60 mL/min (ref >60)
GFR calc non Af Amer: >60 mL/min (ref >60)
Glucose, Bld: 126 mg/dL — ABNORMAL HIGH (ref 70–99)
Potassium: 4.3 mmol/L (ref 3.5–5.1)
Sodium: 136 mmol/L (ref 135–145)

## 2019-08-20 LAB — CBC
HCT: 38.1 % — ABNORMAL LOW (ref 39.0–52.0)
Hemoglobin: 13 g/dL (ref 13.0–17.0)
MCH: 31.3 pg (ref 26.0–34.0)
MCHC: 34.1 g/dL (ref 30.0–36.0)
MCV: 91.6 fL (ref 80.0–100.0)
Platelets: 144 10*3/uL — ABNORMAL LOW (ref 150–400)
RBC: 4.16 MIL/uL — ABNORMAL LOW (ref 4.22–5.81)
RDW: 13.2 % (ref 11.5–15.5)
WBC: 9.7 10*3/uL (ref 4.0–10.5)
nRBC: 0 % (ref 0.0–0.2)

## 2019-08-20 MED ORDER — CARVEDILOL 25 MG PO TABS: 25.0000 mg | ORAL_TABLET | Freq: Two times a day (BID) | ORAL | Status: DC

## 2019-08-20 MED ORDER — BISACODYL 5 MG PO TBEC
10.0000 mg | DELAYED_RELEASE_TABLET | Freq: Every day | ORAL | Status: DC | PRN
  Administered 2019-08-20: 10 mg via ORAL
  Filled 2019-08-20: qty 2

## 2019-08-20 MED ORDER — POLYETHYLENE GLYCOL 3350 17 G PO PACK
17.0000 g | PACK | Freq: Every day | ORAL | Status: DC
  Administered 2019-08-20: 17 g via ORAL
  Filled 2019-08-20: qty 1

## 2019-08-20 MED ORDER — ENOXAPARIN SODIUM 40 MG/0.4ML ~~LOC~~ SOLN: 40.0000 mg | SUBCUTANEOUS | 0 refills | Status: DC

## 2019-08-20 MED ORDER — CARVEDILOL 25 MG PO TABS: 25.0000 mg | ORAL_TABLET | Freq: Two times a day (BID) | ORAL | 0 refills | Status: AC

## 2019-08-20 MED ORDER — HYDROCODONE-ACETAMINOPHEN 5-325 MG PO TABS: 1.0000 | ORAL_TABLET | Freq: Four times a day (QID) | ORAL | 0 refills | Status: AC | PRN

## 2019-08-20 NOTE — Discharge Summary (Signed)
Physician Discharge Summary  Jeffrey Ashley ZRA:[id number] DOB: [birth year] DOA: 08/17/2019  PCP: Marguarite Arbour, MD  Admit date: 08/17/2019 Discharge date: 08/20/2019  Admitted From: Home [address] [facility]) Disposition: SNF at [facility]  Recommendations for Outpatient Follow-up:  1. Follow up with PCP in 1-2 weeks 2. F/u ortho sur, Dr. Odis Luster, in 2 weeks  Home Health: no  Equipment/Devices:  Discharge Condition: stable CODE STATUS: DNR Diet recommendation: Heart Healthy  Brief/Interim Summary: HPI was taken from Dr. Para March: Jeffrey Ashley is a 86 y.o. male with medical history significant for atrial fibrillation on aspirin, hypertension, hypothyroidism and glaucoma, who is status post cataract surgery yesterday on 08/16/2019 who presents to the emergency room following a fall in his kitchen onto his left hip with inability to get up.  He did not strike his head and did not lose consciousness.  He reports having trouble getting around since his cataract surgery yesterday.  He denies preceding lightheadedness visual disturbance or one-sided weakness numbness or tingling.  Denies preceding chest pain, palpitations or shortness of breath.  Reports being in his usual state of health prior to the fall.  ED Course: Work-up in the emergency room mostly unremarkable with fair vitals and mostly normal blood work.  He had extensive imaging significant for hip x-ray that showed left proximal femur fracture.  CT head, CT neck, x-ray elbow where he had a skin tear, with no acute findings.  The emergency room provider contacted orthopedist, Dr. Odis Luster who will see patient and arrange for surgery.  Hospitalist consulted for admission.  Hospital Course from Dr. Wilfred Lacy 5/19-5/21/21: Pt was found to have a closed left femoral fracture after a fall at home/[facility]. Ortho surg was consulted and pt had a left intramedullary nail intertrochanteric placed on 08/18/19. Pt tolerated the surgery relatively  good. Pt will be on lovenox 40mg  daily x 14 days for DVT prophylaxis as per ortho surg. PT/OT saw the pt and recommended SNF. Pt will f/u w/ Dr. in 2 weeks outpatient. Furthermore, pt's coreg dose was increased to 25mg  BID for chronic a. fib & HTN.   Discharge Diagnoses:  Principal Problem:   Closed left femoral fracture [facility]) Active Problems:   Atrial fibrillation, chronic (HCC)   Essential hypertension   Hypothyroidism   Fall at home, initial encounter   Preoperative clearance   S/P cataract extraction   Closed left hip fracture, initial encounter (HCC)  Closed left femoral fracture: secondary to fall at home. S/p left intramedullary nail intertrochantric as per ortho surg 08/18/19. WBAT. Lovenox 40mg  daily x 2 weeks for DVT prophylaxis as per ortho surg. PT recs SNF   Glaucoma: continue on home dose of timolol & latanoprost eye drops.  S/p cataract extraction 08/16/19  Hypothyroidism: continue levothyroxine  Atrial fibrillation: chronic. Continue on carvedilol. Not on anticoagulation at home, unsure of reason not on anticoagulation, possible fall risk.   Essential hypertension: continue carvedilol   Thrombocytopenia: etiology unclear, mild. Will continue to monitor   Discharge Instructions  Discharge Instructions    Diet - low sodium heart healthy   Complete by: As directed    Discharge instructions   Complete by: As directed    F/U PCP in 2 weeks; F/u ortho surgery in 2 weeks   Increase activity slowly   Complete by: As directed      Allergies as of 08/20/2019      Reactions   Brimonidine Tartrate    USING EYE DROP   Combigan [brimonidine  Tartrate-timolol] Other (See Comments)   Unknown reaction.      Medication List    TAKE these medications   aspirin 81 MG EC tablet Take by mouth.   carvedilol 25 MG tablet Commonly known as: COREG Take 1 tablet (25 mg total) by mouth 2 (two) times daily. What changed:   medication strength  how much to take    enoxaparin 40 MG/0.4ML injection Commonly known as: LOVENOX Inject 0.4 mLs (40 mg total) into the skin daily for 14 days.   HYDROcodone-acetaminophen 5-325 MG tablet Commonly known as: NORCO/VICODIN Take 1 tablet by mouth every 6 (six) hours as needed for up to 3 days for moderate pain or severe pain.   latanoprost 0.005 % ophthalmic solution Commonly known as: XALATAN Apply 1 drop to eye at bedtime.   levothyroxine 50 MCG tablet Commonly known as: SYNTHROID Take 50 mcg by mouth daily before breakfast.   polyethylene glycol powder 17 GM/SCOOP powder Commonly known as: GLYCOLAX/MIRALAX Take by mouth.   PRESERVISION AREDS 2 PO Take by mouth 2 (two) times daily.   timolol 0.5 % ophthalmic solution Commonly known as: TIMOPTIC Place 1 drop into both eyes at bedtime.   vitamin B-12 500 MCG tablet Commonly known as: CYANOCOBALAMIN Take 500 mcg by mouth 2 (two) times a week.   Vitamin D-3 125 MCG (5000 UT) Tabs Take 5,000 Units by mouth daily.      Contact information for after-discharge care    Destination    [facility] .   Service: Skilled Nursing Contact information: [address] [place] [address] [phone]             Allergies  Allergen Reactions  . Brimonidine Tartrate     USING EYE DROP  . Combigan [Brimonidine Tartrate-Timolol] Other (See Comments)    Unknown reaction.    Consultations:  Ortho surg, Dr. Odis Luster   Procedures/Studies: DG Elbow 2 Views Left  Result Date: 08/17/2019 CLINICAL DATA:  Fall EXAM: LEFT ELBOW - 2 VIEW COMPARISON:  None. FINDINGS: There is no evidence of fracture, dislocation, or joint effusion. There is no evidence of arthropathy or other focal bone abnormality. Soft tissues are unremarkable. IMPRESSION: Negative. Electronically Signed   By: Jasmine Pang M.D.   On: 08/17/2019 20:25   CT Head Wo Contrast  Result Date: 08/17/2019 CLINICAL DATA:  Status post fall. EXAM: CT HEAD  WITHOUT CONTRAST TECHNIQUE: Contiguous axial images were obtained from the base of the skull through the vertex without intravenous contrast. COMPARISON:  July 07, 2016 FINDINGS: Brain: There is mild cerebral atrophy with widening of the extra-axial spaces and ventricular dilatation. There are areas of decreased attenuation within the white matter tracts of the supratentorial brain, consistent with microvascular disease changes. Vascular: No hyperdense vessel or unexpected calcification. Skull: Right parietal and right frontoparietal burr holes are seen. Sinuses/Orbits: There is marked severity bilateral ethmoid sinus and left maxillary sinus mucosal thickening. Other: None. IMPRESSION: 1. Generalized cerebral atrophy. 2. No acute intracranial abnormality. 3. Marked severity bilateral ethmoid sinus and left maxillary sinus disease. Electronically Signed   By: Aram Candela M.D.   On: 08/17/2019 20:10   CT Cervical Spine Wo Contrast  Result Date: 08/17/2019 CLINICAL DATA:  Status post fall. EXAM: CT CERVICAL SPINE WITHOUT CONTRAST TECHNIQUE: Multidetector CT imaging of the cervical spine was performed without intravenous contrast. Multiplanar CT image reconstructions were also generated. COMPARISON:  None. FINDINGS: Alignment: Normal. Skull base and vertebrae: No acute fracture. No primary  bone lesion or focal pathologic process. Soft tissues and spinal canal: No prevertebral fluid or swelling. No visible canal hematoma. Disc levels: Moderate severity multilevel endplate sclerosis is seen throughout the cervical spine with moderate to marked severity multilevel intervertebral disc space narrowing. Moderate severity bilateral multilevel facet joint hypertrophy is noted. Upper chest: Negative. Other: None. IMPRESSION: 1. No acute osseous abnormality of the cervical spine. 2. Moderate to marked severity multilevel degenerative changes. Electronically Signed   By: Aram Candela M.D.   On: 08/17/2019 20:16    DG Chest Portable 1 View  Result Date: 08/17/2019 CLINICAL DATA:  Status post fall. EXAM: PORTABLE CHEST 1 VIEW COMPARISON:  March 14, 2013 FINDINGS: There is no evidence of acute infiltrate, pleural effusion or pneumothorax. The cardiac silhouette is markedly enlarged. There is moderate severity calcification of the aortic arch. The visualized skeletal structures are unremarkable. IMPRESSION: Cardiomegaly without evidence of acute or active cardiopulmonary disease. Electronically Signed   By: Aram Candela M.D.   On: 08/17/2019 20:28   DG HIP OPERATIVE UNILAT W OR W/O PELVIS LEFT  Result Date: 08/18/2019 CLINICAL DATA:  Intramedullary nail fixation, left hip intratrochanteric fracture EXAM: OPERATIVE LEFT HIP (WITH PELVIS IF PERFORMED) 2 VIEWS TECHNIQUE: Fluoroscopic spot image(s) were submitted for interpretation post-operatively. COMPARISON:  08/17/2019 FINDINGS: Intraoperative fluoroscopic images demonstrate intramedullary nail fixation of an intratrochanteric fracture of the proximal left femur, with near anatomic alignment of fracture fragments. IMPRESSION: Intraoperative fluoroscopic images demonstrate intramedullary nail fixation of an intratrochanteric fracture of the proximal left femur, with near anatomic alignment of fracture fragments. Electronically Signed   By: Lauralyn Primes M.D.   On: 08/18/2019 14:10   DG Hip Unilat W or Wo Pelvis 2-3 Views Left  Result Date: 08/17/2019 CLINICAL DATA:  Status post fall. EXAM: DG HIP (WITH OR WITHOUT PELVIS) 2-3V LEFT COMPARISON:  None. FINDINGS: Acute fracture is seen involving the greater trochanter of the proximal left femur. There is possible involvement of the inter trochanteric region. There is no evidence of dislocation. There is no evidence of significant arthropathy or other focal bone abnormality. Small phleboliths are seen within the pelvis on the left. IMPRESSION: Acute fracture of the proximal left femur. Electronically Signed   By:  Aram Candela M.D.   On: 08/17/2019 20:27       Subjective: Pt c/o fatigue   Discharge Exam: Vitals:   08/20/19 0004 08/20/19 0843  BP: (!) 149/93 (!) 163/109  Pulse: 89 67  Resp: 18 18  Temp: 98.7 F (37.1 C) 97.7 F (36.5 C)  SpO2: 95% 96%   Vitals:   08/19/19 1536 08/19/19 1545 08/20/19 0004 08/20/19 0843  BP: (!) 155/118 (!) 145/90 (!) 149/93 (!) 163/109  Pulse: (!) 103 94 89 67  Resp: 16 18 18 18   Temp: 98.1 F (36.7 C) 98.4 F (36.9 C) 98.7 F (37.1 C) 97.7 F (36.5 C)  TempSrc: Oral  Oral   SpO2: 90% 92% 95% 96%  Weight:      Height:        General: Pt is alert, awake, not in acute distress Cardiovascular: irregularly irregular, no rubs, no gallops Respiratory: CTA bilaterally, no wheezing, no rhonchi Abdominal: Soft, NT, ND, bowel sounds + Extremities: no cyanosis    The results of significant diagnostics from this hospitalization (including imaging, microbiology, ancillary and laboratory) are listed below for reference.     Microbiology: Recent Results (from the past 240 hour(s))  SARS CORONAVIRUS 2 (TAT 6-24 HRS) Nasopharyngeal Nasopharyngeal Swab  Status: None   Collection Time: 08/12/19 10:59 AM   Specimen: Nasopharyngeal Swab  Result Value Ref Range Status   SARS Coronavirus 2 NEGATIVE NEGATIVE Final    Comment: (NOTE) SARS-CoV-2 target nucleic acids are NOT DETECTED. The SARS-CoV-2 RNA is generally detectable in upper and lower respiratory specimens during the acute phase of infection. Negative results do not preclude SARS-CoV-2 infection, do not rule out co-infections with other pathogens, and should not be used as the sole basis for treatment or other patient management decisions. Negative results must be combined with clinical observations, patient history, and epidemiological information. The expected result is Negative. Fact Sheet for Patients: HairSlick.nohttps://www.fda.gov/media/138098/download Fact Sheet for Healthcare  Providers: quierodirigir.comhttps://www.fda.gov/media/138095/download This test is not yet approved or cleared by the [place] FDA and  has been authorized for detection and/or diagnosis of SARS-CoV-2 by FDA under an Emergency Use Authorization (EUA). This EUA will remain  in effect (meaning this test can be used) for the duration of the COVID-19 declaration under Section 56 4(b)(1) of the Act, 21 U.S.C. section 360bbb-3(b)(1), unless the authorization is terminated or revoked sooner. Performed at [facility], [address] [place], [place] [address]   SARS Coronavirus 2 by RT PCR (hospital order, performed in [facility] hospital lab) Nasopharyngeal Nasopharyngeal Swab     Status: None   Collection Time: 08/17/19  7:20 PM   Specimen: Nasopharyngeal Swab  Result Value Ref Range Status   SARS Coronavirus 2 NEGATIVE NEGATIVE Final    Comment: (NOTE) SARS-CoV-2 target nucleic acids are NOT DETECTED. The SARS-CoV-2 RNA is generally detectable in upper and lower respiratory specimens during the acute phase of infection. The lowest concentration of SARS-CoV-2 viral copies this assay can detect is 250 copies / mL. A negative result does not preclude SARS-CoV-2 infection and should not be used as the sole basis for treatment or other patient management decisions.  A negative result may occur with improper specimen collection / handling, submission of specimen other than nasopharyngeal swab, presence of viral mutation(s) within the areas targeted by this assay, and inadequate number of viral copies (<250 copies / mL). A negative result must be combined with clinical observations, patient history, and epidemiological information. Fact Sheet for Patients:   BoilerBrush.com.cyhttps://www.fda.gov/media/136312/download Fact Sheet for Healthcare Providers: https://pope.com/https://www.fda.gov/media/136313/download This test is not yet approved or cleared  by the [place] FDA and has been authorized for detection and/or diagnosis  of SARS-CoV-2 by FDA under an Emergency Use Authorization (EUA).  This EUA will remain in effect (meaning this test can be used) for the duration of the COVID-19 declaration under Section 564(b)(1) of the Act, 21 U.S.C. section 360bbb-3(b)(1), unless the authorization is terminated or revoked sooner. Performed at [facility], [address] [place], [place] [address]   Surgical PCR screen     Status: None   Collection Time: 08/18/19 12:26 AM   Specimen: Nasal Mucosa; Nasal Swab  Result Value Ref Range Status   MRSA, PCR NEGATIVE NEGATIVE Final   Staphylococcus aureus NEGATIVE NEGATIVE Final    Comment: (NOTE) The Xpert SA Assay (FDA approved for NASAL specimens in patients 84 years of age and older), is one component of a comprehensive surveillance program. It is not intended to diagnose infection nor to guide or monitor treatment. Performed at [facility], [address] [place], [place] [address]      Labs: BNP (last 3 results) No results for input(s): BNP in the last 8760 hours. Basic Metabolic Panel: Recent Labs  Lab 08/17/19 1920 08/18/19 0527 08/18/19 1727 08/19/19 0730 08/20/19 0420  NA 136 137  --  136 136  K 4.4 4.5  --  4.3 4.3  CL 103 104  --  104 105  CO2 25 25  --  25 25  GLUCOSE 137* 147*  --  127* 126*  BUN 27* 24*  --  26* 28*  CREATININE 0.97 0.74 0.84 0.76 0.78  CALCIUM 8.9 8.9  --  8.3* 8.5*   Liver Function Tests: No results for input(s): AST, ALT, ALKPHOS, BILITOT, PROT, ALBUMIN in the last 168 hours. No results for input(s): LIPASE, AMYLASE in the last 168 hours. No results for input(s): AMMONIA in the last 168 hours. CBC: Recent Labs  Lab 08/17/19 1920 08/18/19 0527 08/18/19 1727 08/19/19 0730 08/20/19 0420  WBC 9.4 8.4 11.0* 9.5 9.7  NEUTROABS 7.1  --   --   --   --   HGB 14.2 14.1 14.2 13.6 13.0  HCT 42.3 41.1 42.0 41.0 38.1*  MCV 91.8 91.3 92.5 93.8 91.6  PLT 155 156 155 144* 144*   Cardiac Enzymes: No  results for input(s): CKTOTAL, CKMB, CKMBINDEX, TROPONINI in the last 168 hours. BNP: Invalid input(s): POCBNP CBG: Recent Labs  Lab 08/16/19 0935  GLUCAP 102*   D-Dimer No results for input(s): DDIMER in the last 72 hours. Hgb A1c No results for input(s): HGBA1C in the last 72 hours. Lipid Profile No results for input(s): CHOL, HDL, LDLCALC, TRIG, CHOLHDL, LDLDIRECT in the last 72 hours. Thyroid function studies No results for input(s): TSH, T4TOTAL, T3FREE, THYROIDAB in the last 72 hours.  Invalid input(s): FREET3 Anemia work up No results for input(s): VITAMINB12, FOLATE, FERRITIN, TIBC, IRON, RETICCTPCT in the last 72 hours. Urinalysis    Component Value Date/Time   COLORURINE AMBER (A) 07/07/2016 1335   APPEARANCEUR CLEAR (A) 07/07/2016 1335   APPEARANCEUR Hazy 03/13/2012 1900   LABSPEC 1.025 07/07/2016 1335   LABSPEC 1.019 03/13/2012 1900   PHURINE 5.0 07/07/2016 1335   GLUCOSEU NEGATIVE 07/07/2016 1335   GLUCOSEU Negative 03/13/2012 1900   HGBUR NEGATIVE 07/07/2016 1335   BILIRUBINUR SMALL (A) 07/07/2016 1335   BILIRUBINUR Negative 03/13/2012 1900   KETONESUR 20 (A) 07/07/2016 1335   PROTEINUR 30 (A) 07/07/2016 1335   NITRITE NEGATIVE 07/07/2016 1335   LEUKOCYTESUR NEGATIVE 07/07/2016 1335   LEUKOCYTESUR 1+ 03/13/2012 1900   Sepsis Labs Invalid input(s): PROCALCITONIN,  WBC,  LACTICIDVEN Microbiology Recent Results (from the past 240 hour(s))  SARS CORONAVIRUS 2 (TAT 6-24 HRS) Nasopharyngeal Nasopharyngeal Swab     Status: None   Collection Time: 08/12/19 10:59 AM   Specimen: Nasopharyngeal Swab  Result Value Ref Range Status   SARS Coronavirus 2 NEGATIVE NEGATIVE Final    Comment: (NOTE) SARS-CoV-2 target nucleic acids are NOT DETECTED. The SARS-CoV-2 RNA is generally detectable in upper and lower respiratory specimens during the acute phase of infection. Negative results do not preclude SARS-CoV-2 infection, do not rule out co-infections with other  pathogens, and should not be used as the sole basis for treatment or other patient management decisions. Negative results must be combined with clinical observations, patient history, and epidemiological information. The expected result is Negative. Fact Sheet for Patients: HairSlick.no Fact Sheet for Healthcare Providers: quierodirigir.com This test is not yet approved or cleared by the [place] FDA and  has been authorized for detection and/or diagnosis of SARS-CoV-2 by FDA under an Emergency Use Authorization (EUA). This EUA will remain  in effect (meaning this  test can be used) for the duration of the COVID-19 declaration under Section 56 4(b)(1) of the Act, 21 U.S.C. section 360bbb-3(b)(1), unless the authorization is terminated or revoked sooner. Performed at [facility], [place] [address], [place], [place] [address]   SARS Coronavirus 2 by RT PCR (hospital order, performed in [facility] hospital lab) Nasopharyngeal Nasopharyngeal Swab     Status: None   Collection Time: 08/17/19  7:20 PM   Specimen: Nasopharyngeal Swab  Result Value Ref Range Status   SARS Coronavirus 2 NEGATIVE NEGATIVE Final    Comment: (NOTE) SARS-CoV-2 target nucleic acids are NOT DETECTED. The SARS-CoV-2 RNA is generally detectable in upper and lower respiratory specimens during the acute phase of infection. The lowest concentration of SARS-CoV-2 viral copies this assay can detect is 250 copies / mL. A negative result does not preclude SARS-CoV-2 infection and should not be used as the sole basis for treatment or other patient management decisions.  A negative result may occur with improper specimen collection / handling, submission of specimen other than nasopharyngeal swab, presence of viral mutation(s) within the areas targeted by this assay, and inadequate number of viral copies (<250 copies / mL). A negative result must be combined with  clinical observations, patient history, and epidemiological information. Fact Sheet for Patients:   StrictlyIdeas.no Fact Sheet for Healthcare Providers: BankingDealers.co.za This test is not yet approved or cleared  by the [place] FDA and has been authorized for detection and/or diagnosis of SARS-CoV-2 by FDA under an Emergency Use Authorization (EUA).  This EUA will remain in effect (meaning this test can be used) for the duration of the COVID-19 declaration under Section 564(b)(1) of the Act, 21 U.S.C. section 360bbb-3(b)(1), unless the authorization is terminated or revoked sooner. Performed at [facility], [address] [place], [place] [address]   Surgical PCR screen     Status: None   Collection Time: 08/18/19 12:26 AM   Specimen: Nasal Mucosa; Nasal Swab  Result Value Ref Range Status   MRSA, PCR NEGATIVE NEGATIVE Final   Staphylococcus aureus NEGATIVE NEGATIVE Final    Comment: (NOTE) The Xpert SA Assay (FDA approved for NASAL specimens in patients 7 years of age and older), is one component of a comprehensive surveillance program. It is not intended to diagnose infection nor to guide or monitor treatment. Performed at [facility], [address], [place], [place] [address]      Time coordinating discharge: Over 30 minutes  SIGNED:   Wyvonnia Dusky, MD  [facility] 08/20/2019, 10:00 AM Pager   If 7PM-7AM, please contact night-coverage [email or web address]

## 2019-08-20 NOTE — Addendum Note (Signed)
Encounter addended by: Baruch Merl, RN on: 08/20/2019 2:10 AM  Actions taken: Flowsheet accepted

## 2019-08-20 NOTE — Progress Notes (Signed)
  Subjective:  Patient reports pain as mild.  Doing well with PT.  Objective:   VITALS:   Vitals:   08/19/19 1141 08/19/19 1536 08/19/19 1545 08/20/19 0004  BP: (!) 135/95 (!) 155/118 (!) 145/90 (!) 149/93  Pulse: 64 (!) 103 94 89  Resp: 17 16 18 18   Temp: 97.7 F (36.5 C) 98.1 F (36.7 C) 98.4 F (36.9 C) 98.7 F (37.1 C)  TempSrc: Oral Oral  Oral  SpO2: 97% 90% 92% 95%  Weight:      Height:        PHYSICAL EXAM:  Neurologically intact ABD soft Neurovascular intact Sensation intact distally Intact pulses distally Dorsiflexion/Plantar flexion intact Incision: dressing C/D/I No cellulitis present Compartment soft  LABS  Results for orders placed or performed during the hospital encounter of 08/17/19 (from the past 24 hour(s))  Basic metabolic panel     Status: Abnormal   Collection Time: 08/20/19  4:20 AM  Result Value Ref Range   Sodium 136 135 - 145 mmol/L   Potassium 4.3 3.5 - 5.1 mmol/L   Chloride 105 98 - 111 mmol/L   CO2 25 22 - 32 mmol/L   Glucose, Bld 126 (H) 70 - 99 mg/dL   BUN 28 (H) 8 - 23 mg/dL   Creatinine, Ser 08/22/19 0.61 - 1.24 mg/dL   Calcium 8.5 (L) 8.9 - 10.3 mg/dL   GFR calc non Af Amer >60 >60 mL/min   GFR calc Af Amer >60 >60 mL/min   Anion gap 6 5 - 15  CBC     Status: Abnormal   Collection Time: 08/20/19  4:20 AM  Result Value Ref Range   WBC 9.7 4.0 - 10.5 K/uL   RBC 4.16 (L) 4.22 - 5.81 MIL/uL   Hemoglobin 13.0 13.0 - 17.0 g/dL   HCT 08/22/19 (L) 66.4 - 40.3 %   MCV 91.6 80.0 - 100.0 fL   MCH 31.3 26.0 - 34.0 pg   MCHC 34.1 30.0 - 36.0 g/dL   RDW 47.4 25.9 - 56.3 %   Platelets 144 (L) 150 - 400 K/uL   nRBC 0.0 0.0 - 0.2 %    DG HIP OPERATIVE UNILAT W OR W/O PELVIS LEFT  Result Date: 08/18/2019 CLINICAL DATA:  Intramedullary nail fixation, left hip intratrochanteric fracture EXAM: OPERATIVE LEFT HIP (WITH PELVIS IF PERFORMED) 2 VIEWS TECHNIQUE: Fluoroscopic spot image(s) were submitted for interpretation post-operatively.  COMPARISON:  08/17/2019 FINDINGS: Intraoperative fluoroscopic images demonstrate intramedullary nail fixation of an intratrochanteric fracture of the proximal left femur, with near anatomic alignment of fracture fragments. IMPRESSION: Intraoperative fluoroscopic images demonstrate intramedullary nail fixation of an intratrochanteric fracture of the proximal left femur, with near anatomic alignment of fracture fragments. Electronically Signed   By: 08/19/2019 M.D.   On: 08/18/2019 14:10    Assessment/Plan: 2 Days Post-Op   Principal Problem:   Closed left femoral fracture (HCC) Active Problems:   Atrial fibrillation, chronic (HCC)   Essential hypertension   Hypothyroidism   Fall at home, initial encounter   Preoperative clearance   S/P cataract extraction   Closed left hip fracture, initial encounter (HCC)   Advance diet Up with therapy  WBAT in LLE Continue pain control Lovenox 40mg  daily X 2 weeks post surgery, Will continue aspirin throughout Follow up in 2 weeks for staple removal, call for appointment [phone] Discharge per medicine when medically stable to SNF   , PA-C 08/20/2019, 8:07 AM

## 2019-08-20 NOTE — Progress Notes (Signed)
Physical Therapy Treatment Patient Details Name: Jeffrey Ashley MRN: [id number] DOB: 1926/04/27 Today's Date: 08/20/2019    History of Present Illness 86 y/o male s/p fall with L hip fx.  S/p IM nailing 5/19, recent R cataract surgery 5/17.    PT Comments    Pt was seated EOB with RN/RN tech in room. Pt requesting to get on/off BSC. He agrees to therapist assistance and session. Pt stood from EOB and was able to take steps with min assist + vcs for sequencing. Poor standing posture and LE advancement due to pain. Unsuccessful attempt at having BM. He stood and return to EOB. Unwilling to progress further gait distances due to pain. Required mod assist to progress BLEs back into bed. He was repositioned in bed with HOB elevated and call bell in reach.Pt is planning to DC this date to SNF. He will benefit from continued PT to address strength, balance, and safe functional mobility deficits.   Follow Up Recommendations  SNF;Supervision for mobility/OOB     Equipment Recommendations  Other (comment)(defer to next level of care)    Recommendations for Other Services       Precautions / Restrictions Precautions Precautions: Fall Restrictions Weight Bearing Restrictions: Yes LLE Weight Bearing: Weight bearing as tolerated    Mobility  Bed Mobility Overal bed mobility: Needs Assistance Bed Mobility: Sit to Supine       Sit to supine: Mod assist   General bed mobility comments: Pt was seated EOB with RN/RN tech upon therapist arriving. Rn requested therapist take over assisting pt to [facility]. after unsuccesful use of BSC pt required mod assist to return to supine form EOB sitting.   Transfers Overall transfer level: Needs assistance Equipment used: Rolling walker (2 wheeled) Transfers: Sit to/from Stand Sit to Stand: Min assist;From elevated surface         General transfer comment: Pt was able to STS from EOB and from BSc with min assist + vcs for technique.    Ambulation/Gait Ambulation/Gait assistance: Min assist Gait Distance (Feet): 3 Feet Assistive device: Rolling walker (2 wheeled) Gait Pattern/deviations: Step-to pattern;Trunk flexed;Antalgic Gait velocity: decreased   General Gait Details: Pt was able to take several steps to [facility] then several steps back to EOB. unwilling to trial further gait distances 2/2 to pain. He could progress BLEs but required heavy reliance of RW and min assist to wt shift.   Stairs             Wheelchair Mobility    Modified Rankin (Stroke Patients Only)       Balance Overall balance assessment: Needs assistance Sitting-balance support: Feet supported Sitting balance-Leahy Scale: Fair Sitting balance - Comments: no LOB seated EOB or on BSC   Standing balance support: Bilateral upper extremity supported Standing balance-Leahy Scale: Fair Standing balance comment: no LOB however heavy support/reliance of UE support on RW to maintain standing                            Cognition Arousal/Alertness: Awake/alert Behavior During Therapy: WFL for tasks assessed/performed Overall Cognitive Status: Within Functional Limits for tasks assessed                                 General Comments: Pt is A and O x 4 and agreeable to PT session requesting assistance to [facility].      Exercises  General Comments        Pertinent Vitals/Pain Pain Assessment: 0-10 Pain Score: 9  Pain Location: L hip Pain Descriptors / Indicators: Discomfort;Guarding;Sharp;Shooting;Sore Pain Intervention(s): Limited activity within patient's tolerance;Monitored during session;Premedicated before session;Repositioned    Home Living                      Prior Function            PT Goals (current goals can now be found in the care plan section) Acute Rehab PT Goals Patient Stated Goal: " I want to return to PLOF" Progress towards PT goals: Progressing toward goals     Frequency    BID      PT Plan Current plan remains appropriate    Co-evaluation              AM-PAC PT "6 Clicks" Mobility   Outcome Measure  Help needed turning from your back to your side while in a flat bed without using bedrails?: A Little Help needed moving from lying on your back to sitting on the side of a flat bed without using bedrails?: A Lot Help needed moving to and from a bed to a chair (including a wheelchair)?: A Lot Help needed standing up from a chair using your arms (e.g., wheelchair or bedside chair)?: A Lot Help needed to walk in hospital room?: A Lot Help needed climbing 3-5 steps with a railing? : A Lot 6 Click Score: 13    End of Session Equipment Utilized During Treatment: Gait belt Activity Tolerance: Patient tolerated treatment well;Patient limited by pain Patient left: in bed;with call bell/phone within reach;with bed alarm set;with nursing/sitter in room(RN tech in rm at conclusion) Nurse Communication: Mobility status PT Visit Diagnosis: Muscle weakness (generalized) (M62.81);Difficulty in walking, not elsewhere classified (R26.2)     Time: 5597-4163 PT Time Calculation (min) (ACUTE ONLY): 13 min  Charges:  $Therapeutic Activity: 8-22 mins                     Jetta Lout PTA 08/20/19, 12:24 PM

## 2019-08-20 NOTE — Care Management Important Message (Signed)
Important Message  Patient Details  Name: Jeffrey Ashley MRN: [id number] Date of Birth: [birth year]   Medicare Important Message Given:  N/A - LOS <3 / Initial given by admissions     Olegario Messier A Braedin Millhouse 08/20/2019, 8:35 AM

## 2019-08-20 NOTE — Progress Notes (Signed)
Called report to [place] at [facility]. Answered all questions. EMS called for transport

## 2019-08-24 DIAGNOSIS — E039 Hypothyroidism, unspecified: Secondary | ICD-10-CM | POA: Diagnosis not present

## 2019-08-24 DIAGNOSIS — I4819 Other persistent atrial fibrillation: Secondary | ICD-10-CM | POA: Diagnosis not present

## 2019-08-24 DIAGNOSIS — S72002A Fracture of unspecified part of neck of left femur, initial encounter for closed fracture: Secondary | ICD-10-CM

## 2019-09-16 DIAGNOSIS — I4819 Other persistent atrial fibrillation: Secondary | ICD-10-CM | POA: Diagnosis not present

## 2019-09-16 DIAGNOSIS — S72002A Fracture of unspecified part of neck of left femur, initial encounter for closed fracture: Secondary | ICD-10-CM | POA: Diagnosis not present

## 2019-09-16 DIAGNOSIS — E039 Hypothyroidism, unspecified: Secondary | ICD-10-CM | POA: Diagnosis not present

## 2019-10-22 DIAGNOSIS — S72002A Fracture of unspecified part of neck of left femur, initial encounter for closed fracture: Secondary | ICD-10-CM | POA: Diagnosis not present

## 2019-10-22 DIAGNOSIS — E039 Hypothyroidism, unspecified: Secondary | ICD-10-CM | POA: Diagnosis not present

## 2019-10-22 DIAGNOSIS — I4891 Unspecified atrial fibrillation: Secondary | ICD-10-CM | POA: Diagnosis not present

## 2019-11-09 DIAGNOSIS — N400 Enlarged prostate without lower urinary tract symptoms: Secondary | ICD-10-CM | POA: Diagnosis not present

## 2019-11-09 DIAGNOSIS — I10 Essential (primary) hypertension: Secondary | ICD-10-CM | POA: Diagnosis not present

## 2019-11-09 DIAGNOSIS — I4819 Other persistent atrial fibrillation: Secondary | ICD-10-CM | POA: Diagnosis not present

## 2019-11-09 DIAGNOSIS — S7292XA Unspecified fracture of left femur, initial encounter for closed fracture: Secondary | ICD-10-CM

## 2019-11-23 ENCOUNTER — Encounter: Payer: Self-pay | Admitting: Internal Medicine

## 2019-12-09 ENCOUNTER — Ambulatory Visit: Payer: Medicare Other | Admitting: Internal Medicine

## 2019-12-09 ENCOUNTER — Encounter: Payer: Self-pay | Admitting: Internal Medicine

## 2019-12-09 ENCOUNTER — Other Ambulatory Visit: Payer: Self-pay

## 2019-12-09 DIAGNOSIS — I10 Essential (primary) hypertension: Secondary | ICD-10-CM

## 2019-12-09 DIAGNOSIS — I482 Chronic atrial fibrillation, unspecified: Secondary | ICD-10-CM | POA: Diagnosis not present

## 2019-12-09 DIAGNOSIS — H409 Unspecified glaucoma: Secondary | ICD-10-CM | POA: Insufficient documentation

## 2019-12-09 DIAGNOSIS — H4010X0 Unspecified open-angle glaucoma, stage unspecified: Secondary | ICD-10-CM | POA: Diagnosis not present

## 2019-12-09 DIAGNOSIS — E039 Hypothyroidism, unspecified: Secondary | ICD-10-CM

## 2019-12-09 NOTE — Assessment & Plan Note (Signed)
Doing okay with current topical therapy

## 2019-12-09 NOTE — Progress Notes (Signed)
Subjective:    Patient ID: Jeffrey Ashley, male    DOB: [birth year], 86 y.o.   MRN: [id number]  HPI Visit in [place] apartment to establish care Reviewed status with Luellen Pucker RN Records reviewed and updated in [facility]  I met him while he was in health care after fractured his hip Was able to finally move back here recently 35 years at [facility] till breaking hip  Chronic atrial fibrillation Carvedilol for rate control ASA only  No chest pain No SOB No dizziness or syncope Walks with rollator Dresses/bathroom independently Stand by assist for first week--now independent  No issues on thyroid med  Current Outpatient Medications on File Prior to Visit  Medication Sig Dispense Refill  . aspirin 81 MG EC tablet Take by mouth.    . Cholecalciferol (VITAMIN D-3) 5000 units TABS Take 5,000 Units by mouth daily.    Marland Kitchen latanoprost (XALATAN) 0.005 % ophthalmic solution Apply 1 drop to eye at bedtime.    Marland Kitchen levothyroxine (SYNTHROID, LEVOTHROID) 50 MCG tablet Take 50 mcg by mouth daily before breakfast.  1  . Multiple Vitamins-Minerals (PRESERVISION AREDS 2 PO) Take by mouth 2 (two) times daily.    . timolol (TIMOPTIC) 0.5 % ophthalmic solution Place 1 drop into both eyes at bedtime.    . vitamin B-12 (CYANOCOBALAMIN) 500 MCG tablet Take 500 mcg by mouth 2 (two) times a week.    . carvedilol (COREG) 25 MG tablet Take 1 tablet (25 mg total) by mouth 2 (two) times daily. 60 tablet 0   No current facility-administered medications on file prior to visit.    Allergies  Allergen Reactions  . Brimonidine Tartrate     USING EYE DROP  . Combigan [Brimonidine Tartrate-Timolol] Other (See Comments)    Unknown reaction.    Past Medical History:  Diagnosis Date  . 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 Dysrhythmia    AFIB  . HOH (hard of hearing)   . Hypertension   . Hypothyroidism   . Nontraumatic subdural hematoma ([place])   . 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 APPENDECTOMY    . BRAIN SURGERY     CRANIO  . CATARACT EXTRACTION W/PHACO Left 03/06/2017   Procedure: CATARACT EXTRACTION PHACO AND INTRAOCULAR LENS PLACEMENT (IOC);  Surgeon: Eulogio Bear, MD;  Location: [facility] ORS;  Service: Ophthalmology;  Laterality: Left;  [place] 01:10.8 AP% 18.8 CDE 13.39 FLUID PACK LOT # [id number] H  . CATARACT EXTRACTION W/PHACO Right 08/16/2019   Procedure: CATARACT EXTRACTION PHACO AND INTRAOCULAR LENS PLACEMENT (IOC) RIGHT ISTENT INJ 8.84  01:03.5;  Surgeon: Eulogio Bear, MD;  Location: [facility];  Service: Ophthalmology;  Laterality: Right;  . CHOLECYSTECTOMY    . INTRAMEDULLARY (IM) NAIL INTERTROCHANTERIC Left 08/18/2019   Procedure: INTRAMEDULLARY (IM) NAIL INTERTROCHANTRIC;  Surgeon: Lovell Sheehan, MD;  Location: [facility] ORS;  Service: Orthopedics;  Laterality: Left;  . Kidney Tumor Removal      Family History  Problem Relation Age of Onset  . Colon cancer Sister     Social History   Socioeconomic History  . Marital status: Widowed    Spouse name: Not on file  . Number of children: 4  . Years of education: Not on file  . Highest education level: Not on file  Occupational History  . 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 Smoking status: Never Smoker  . Smokeless tobacco: Never Used  Vaping Use  . Vaping Use: Never used  Substance and Sexual Activity  . Alcohol use: Not Currently    Comment: OCCAS  . Drug use: No  . Sexual activity: Not on file  Other Topics Concern  . Not on file  Social History Narrative   4 sons      Has living will   Son [name] is health care POA   Has DNR   No feeding tube   Social Determinants of Health   Financial Resource Strain:   . Difficulty of Paying Living Expenses: Not on file  Food Insecurity:   . Worried About Charity fundraiser in the Last Year: Not on file  . Ran Out of Food in the  Last Year: Not on file  Transportation Needs:   . Lack of Transportation (Medical): Not on file  . Lack of Transportation (Non-Medical): Not on file  Physical Activity:   . Days of Exercise per Week: Not on file  . Minutes of Exercise per Session: Not on file  Stress:   . Feeling of Stress : Not on file  Social Connections:   . Frequency of Communication with Friends and Family: Not on file  . Frequency of Social Gatherings with Friends and Family: Not on file  . Attends Religious Services: Not on file  . Active Member of Clubs or Organizations: Not on file  . Attends Archivist Meetings: Not on file  . Marital Status: Not on file  Intimate Partner Violence:   . Fear of Current or Ex-Partner: Not on file  . Emotionally Abused: Not on file  . Physically Abused: Not on file  . Sexually Abused: Not on file   Review of Systems Appetite is good Weight is down some--but now stabilized Sleeps well Mood is good    Objective:   Physical Exam Constitutional:      Appearance: Normal appearance.  Cardiovascular:     Rate and Rhythm: Normal rate. Rhythm irregular.     Heart sounds: No murmur heard.  No gallop.   Pulmonary:     Effort: Pulmonary effort is normal.     Breath sounds: Normal breath sounds. No wheezing or rales.  Abdominal:     Palpations: Abdomen is soft.     Tenderness: There is no abdominal tenderness.  Musculoskeletal:     Cervical back: Neck supple.     Right lower leg: No edema.     Left lower leg: No edema.     Comments: Very slight calf fullness but no tenderness  Lymphadenopathy:     Cervical: No cervical adenopathy.  Skin:    Findings: No rash.  Neurological:     Mental Status: He is alert.  Psychiatric:        Mood and Affect: Mood normal.        Behavior: Behavior normal.            Assessment & Plan:

## 2019-12-09 NOTE — Assessment & Plan Note (Signed)
BP Readings from Last 3 Encounters:  12/09/19 131/84  08/20/19 (!) 163/109  08/16/19 (!) 166/91   Good control on the carvedilol

## 2019-12-09 NOTE — Assessment & Plan Note (Signed)
Rate control is good on carvedilol ASA only

## 2019-12-09 NOTE — Assessment & Plan Note (Signed)
Clinically euthyroid Will check labs yearly

## 2019-12-13 ENCOUNTER — Encounter: Payer: Self-pay | Admitting: Internal Medicine

## 2019-12-19 NOTE — Progress Notes (Signed)
12/20/2019 9:36 AM   Jeffrey Ashley December 27, 1926 [id number]  Referring provider: Karie Schwalbe, MD [address] [place],  [place] [address] Chief Complaint  Patient presents with  . Hematuria    HPI: Jeffrey Ashley is a 86 y.o. male who is seen today for evaluation of positive urinalysis. He is accompanied by his niece today.   -He last saw his PCP Dr. Alphonsus Sias on 12/09/19. -Routine urinalysis on 12/13/2019 noted 3+ occult blood, 1+ WBCs, 10-20 RBCs.   -Associated urine culture grew less than 10,000 colonies/mL of single gram positive organism isolated.  -He reports hematuria without discomfort. -He reports 2 years ago he had "gushing" blood in his urine.  -Denies flank, abdominal or back pain. -Denies difficulty urinating.  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 Dysrhythmia    AFIB  . HOH (hard of hearing)   . Hypertension   . Hypothyroidism   . Nontraumatic subdural hematoma (HCC)   . 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 APPENDECTOMY    . BRAIN SURGERY     CRANIO  . CATARACT EXTRACTION W/PHACO Left 03/06/2017   Procedure: CATARACT EXTRACTION PHACO AND INTRAOCULAR LENS PLACEMENT (IOC);  Surgeon: Nevada Crane, MD;  Location: [facility];  Service: Ophthalmology;  Laterality: Left;  [place] 01:10.8 AP% 18.8 CDE 13.39 FLUID PACK LOT # [id number] H  . CATARACT EXTRACTION W/PHACO Right 08/16/2019   Procedure: CATARACT EXTRACTION PHACO AND INTRAOCULAR LENS PLACEMENT (IOC) RIGHT ISTENT INJ 8.84  01:03.5;  Surgeon: Nevada Crane, MD;  Location: [facility];  Service: Ophthalmology;  Laterality: Right;  . CHOLECYSTECTOMY    . INTRAMEDULLARY (IM) NAIL INTERTROCHANTERIC Left 08/18/2019   Procedure: INTRAMEDULLARY (IM) NAIL INTERTROCHANTRIC;   Surgeon: Lyndle Herrlich, MD;  Location: [facility] OR[facility];  Service: Orthopedics;  Laterality: Left;  . Kidney Tumor Removal      Home Medications:  Allergies as of 12/20/2019      Reactions   Brimonidine Tartrate    USING EYE DROP   Combigan [brimonidine Tartrate-timolol] Other (See Comments)   Unknown reaction.      Medication List       Accurate as of December 20, 2019  9:36 AM. If you have any questions, ask your nurse or doctor.        aspirin 81 MG EC tablet Take by mouth.   carvedilol 25 MG tablet Commonly known as: COREG Take 1 tablet (25 mg total) by mouth 2 (two) times daily.   latanoprost 0.005 % ophthalmic solution Commonly known as: XALATAN Apply 1 drop to eye at bedtime.   levothyroxine 50 MCG tablet Commonly known as: SYNTHROID Take 50 mcg by mouth daily before breakfast.   PRESERVISION AREDS 2 PO Take by mouth 2 (two) times daily.   timolol 0.5 % ophthalmic solution Commonly known as: TIMOPTIC Place 1 drop into both eyes at bedtime.   vitamin B-12 500 MCG tablet Commonly known as: CYANOCOBALAMIN Take 500 mcg by mouth 2 (two) times a week.   Vitamin D-3 125 MCG (5000 UT) Tabs Take 5,000 Units by mouth daily.       Allergies:  Allergies  Allergen Reactions  . Brimonidine Tartrate     USING EYE DROP  . Combigan [Brimonidine Tartrate-Timolol] Other (  See Comments)    Unknown reaction.    Family History: Family History  Problem Relation Age of Onset  . Colon cancer Sister     Social History:  reports that he has never smoked. He has never used smokeless tobacco. He reports previous alcohol use. He reports that he does not use drugs.   Physical Exam: BP (!) 105/58   Pulse 78   Ht 6' (1.829 m)   Wt 173 lb 9.6 oz (78.7 kg)   BMI 23.54 kg/m   Constitutional:  Alert and oriented, No acute distress. HEENT: [place] AT, moist mucus membranes.  Trachea midline, no masses. Cardiovascular: No clubbing, cyanosis, or edema. Respiratory: Normal  respiratory effort, no increased work of breathing. GI: Abdomen is soft, nontender, nondistended, no abdominal masses GU: No CVA tenderness Lymph: No cervical or inguinal lymphadenopathy. Rectal: Normal sphincter tone, 40 g prostate, smooth no nodules.  Skin: No rashes, bruises or suspicious lesions. Neurologic: Grossly intact, no focal deficits, moving all 4 extremities. Psychiatric: Normal mood and affect.  Laboratory Data:  Lab Results  Component Value Date   CREATININE 0.78 08/20/2019    Urinalysis Dipstick trace blood/leukocyte negative Microscopy 6-10 WBC, calcium oxalate crystals  Assessment & Plan:    1. Gross Hematuria   AUA risk stratification: High  We discussed the differential diagnosis for microscopic hematuria including nephrolithiasis, renal or upper tract tumors, bladder stones, UTIs, or bladder tumors as well as undetermined etiologies.  Per AUA guidelines, I did recommend complete high risk hematuria evaluation including CTU and cystoscopy.  All questions were answered and he desires to proceed    [facility] [address], [address] [place], [place] [address] [phone]  I, Theador Hawthorne, am acting as a scribe for Dr. Lorin Picket C. Aqib Lough,  I have reviewed the above documentation for accuracy and completeness, and I agree with the above.    Riki Altes, MD

## 2019-12-20 ENCOUNTER — Other Ambulatory Visit: Payer: Self-pay

## 2019-12-20 ENCOUNTER — Ambulatory Visit (INDEPENDENT_AMBULATORY_CARE_PROVIDER_SITE_OTHER): Payer: Medicare Other | Admitting: Urology

## 2019-12-20 ENCOUNTER — Encounter: Payer: Self-pay | Admitting: Urology

## 2019-12-20 VITALS — BP 105/58 | HR 78 | Ht 72.0 in | Wt 173.6 lb

## 2019-12-20 DIAGNOSIS — R310 Gross hematuria: Secondary | ICD-10-CM

## 2019-12-21 ENCOUNTER — Encounter: Payer: Self-pay | Admitting: Urology

## 2019-12-21 LAB — URINALYSIS, COMPLETE
Bilirubin, UA: NEGATIVE
Glucose, UA: NEGATIVE
Ketones, UA: NEGATIVE
Leukocytes,UA: NEGATIVE
Nitrite, UA: NEGATIVE
Specific Gravity, UA: 1.025 (ref 1.005–1.030)
Urobilinogen, Ur: 0.2 mg/dL (ref 0.2–1.0)
pH, UA: 5.5 (ref 5.0–7.5)

## 2019-12-21 LAB — MICROSCOPIC EXAMINATION: Bacteria, UA: NONE SEEN

## 2020-01-21 ENCOUNTER — Other Ambulatory Visit: Payer: Self-pay

## 2020-01-21 ENCOUNTER — Ambulatory Visit
Admission: RE | Admit: 2020-01-21 | Discharge: 2020-01-21 | Disposition: A | Discharge status: Home / Self Care | Payer: Medicare Other | Source: Ambulatory Visit | Attending: Urology | Admitting: Urology

## 2020-01-21 DIAGNOSIS — R310 Gross hematuria: Secondary | ICD-10-CM

## 2020-01-21 LAB — POCT I-STAT CREATININE: Creatinine, Ser: 1 mg/dL (ref 0.61–1.24)

## 2020-01-21 MED ORDER — IOHEXOL 300 MG/ML  SOLN
125.0000 mL | Freq: Once | INTRAMUSCULAR | Status: AC | PRN
  Administered 2020-01-21: 125 mL via INTRAVENOUS

## 2020-01-24 ENCOUNTER — Ambulatory Visit: Payer: Medicare Other

## 2020-01-26 ENCOUNTER — Encounter: Payer: Self-pay | Admitting: Urology

## 2020-01-26 ENCOUNTER — Other Ambulatory Visit: Payer: Self-pay

## 2020-01-26 ENCOUNTER — Ambulatory Visit (INDEPENDENT_AMBULATORY_CARE_PROVIDER_SITE_OTHER): Payer: Medicare Other | Admitting: Urology

## 2020-01-26 VITALS — BP 131/81 | HR 77 | Ht 72.0 in | Wt 174.0 lb

## 2020-01-26 DIAGNOSIS — N200 Calculus of kidney: Secondary | ICD-10-CM

## 2020-01-26 DIAGNOSIS — R310 Gross hematuria: Secondary | ICD-10-CM

## 2020-01-26 NOTE — Progress Notes (Signed)
   01/26/20  CC:  Chief Complaint  Patient presents with  . Cysto    HPI:  Blood pressure 131/81, pulse 77, height 6' (1.829 m), weight 174 lb (78.9 kg). NED. A&Ox3.   No respiratory distress   Abd soft, NT, ND Normal phallus with bilateral descended testicles  Cystoscopy Procedure Note  Patient identification was confirmed, informed consent was obtained, and patient was prepped using Betadine solution.  Lidocaine jelly was administered per urethral meatus.     Pre-Procedure: - Inspection reveals a normal caliber ureteral meatus.  Procedure: The flexible cystoscope was introduced without difficulty - No urethral strictures/lesions are present. - Prominent lateral lobe enlargement prostate with prominent hypervascularity - Mild elevation bladder neck - Bilateral ureteral orifices identified - Bladder mucosa  reveals no ulcers, tumors, or lesions - No bladder stones -Moderate trabeculation  Retroflexion shows hypervascularity bladder neck   Post-Procedure: - Patient tolerated the procedure well  Assessment/ Plan:  Finasteride 5 mg daily  KUB 6 months (review CT W partners)   Riki Altes, MD

## 2020-01-27 LAB — URINALYSIS, COMPLETE
Bilirubin, UA: NEGATIVE
Glucose, UA: NEGATIVE
Ketones, UA: NEGATIVE
Leukocytes,UA: NEGATIVE
Nitrite, UA: NEGATIVE
Protein,UA: NEGATIVE
Specific Gravity, UA: >1.03 — ABNORMAL HIGH (ref 1.005–1.030)
Urobilinogen, Ur: 0.2 mg/dL (ref 0.2–1.0)
pH, UA: 5 (ref 5.0–7.5)

## 2020-01-27 LAB — MICROSCOPIC EXAMINATION: Bacteria, UA: NONE SEEN

## 2020-01-30 ENCOUNTER — Encounter: Payer: Self-pay | Admitting: Urology

## 2020-01-30 MED ORDER — FINASTERIDE 5 MG PO TABS: 5.0000 mg | ORAL_TABLET | Freq: Every day | ORAL | 3 refills | Status: AC

## 2020-02-28 ENCOUNTER — Other Ambulatory Visit: Payer: Self-pay | Admitting: Internal Medicine

## 2020-03-11 ENCOUNTER — Emergency Department (HOSPITAL_COMMUNITY): Payer: Medicare Other

## 2020-03-11 ENCOUNTER — Inpatient Hospital Stay (HOSPITAL_COMMUNITY): Payer: Medicare Other

## 2020-03-11 ENCOUNTER — Emergency Department (HOSPITAL_COMMUNITY): Payer: Medicare Other | Admitting: Certified Registered Nurse Anesthetist

## 2020-03-11 ENCOUNTER — Encounter (HOSPITAL_COMMUNITY): Admission: EM | Disposition: E | Payer: Self-pay | Source: Home / Self Care | Attending: Neurology

## 2020-03-11 ENCOUNTER — Inpatient Hospital Stay (HOSPITAL_COMMUNITY)
Admission: EM | Admit: 2020-03-11 | Discharge: 2020-04-01 | DRG: 037 | Disposition: E | Payer: Medicare Other | Attending: Internal Medicine | Admitting: Internal Medicine

## 2020-03-11 DIAGNOSIS — I482 Chronic atrial fibrillation, unspecified: Secondary | ICD-10-CM | POA: Diagnosis present

## 2020-03-11 DIAGNOSIS — J95821 Acute postprocedural respiratory failure: Secondary | ICD-10-CM | POA: Diagnosis not present

## 2020-03-11 DIAGNOSIS — J9601 Acute respiratory failure with hypoxia: Secondary | ICD-10-CM | POA: Diagnosis not present

## 2020-03-11 DIAGNOSIS — R29810 Facial weakness: Secondary | ICD-10-CM | POA: Diagnosis present

## 2020-03-11 DIAGNOSIS — Z79890 Hormone replacement therapy (postmenopausal): Secondary | ICD-10-CM

## 2020-03-11 DIAGNOSIS — Z66 Do not resuscitate: Secondary | ICD-10-CM | POA: Diagnosis present

## 2020-03-11 DIAGNOSIS — I351 Nonrheumatic aortic (valve) insufficiency: Secondary | ICD-10-CM

## 2020-03-11 DIAGNOSIS — J690 Pneumonitis due to inhalation of food and vomit: Secondary | ICD-10-CM | POA: Diagnosis not present

## 2020-03-11 DIAGNOSIS — R471 Dysarthria and anarthria: Secondary | ICD-10-CM | POA: Diagnosis present

## 2020-03-11 DIAGNOSIS — I361 Nonrheumatic tricuspid (valve) insufficiency: Secondary | ICD-10-CM

## 2020-03-11 DIAGNOSIS — R1310 Dysphagia, unspecified: Secondary | ICD-10-CM | POA: Diagnosis not present

## 2020-03-11 DIAGNOSIS — Z9911 Dependence on respirator [ventilator] status: Secondary | ICD-10-CM | POA: Diagnosis not present

## 2020-03-11 DIAGNOSIS — I639 Cerebral infarction, unspecified: Secondary | ICD-10-CM | POA: Insufficient documentation

## 2020-03-11 DIAGNOSIS — J9600 Acute respiratory failure, unspecified whether with hypoxia or hypercapnia: Secondary | ICD-10-CM

## 2020-03-11 DIAGNOSIS — N1831 Chronic kidney disease, stage 3a: Secondary | ICD-10-CM | POA: Diagnosis present

## 2020-03-11 DIAGNOSIS — Y92239 Unspecified place in hospital as the place of occurrence of the external cause: Secondary | ICD-10-CM | POA: Diagnosis present

## 2020-03-11 DIAGNOSIS — I69391 Dysphagia following cerebral infarction: Secondary | ICD-10-CM

## 2020-03-11 DIAGNOSIS — R310 Gross hematuria: Secondary | ICD-10-CM | POA: Diagnosis not present

## 2020-03-11 DIAGNOSIS — T83098A Other mechanical complication of other indwelling urethral catheter, initial encounter: Secondary | ICD-10-CM | POA: Diagnosis not present

## 2020-03-11 DIAGNOSIS — R059 Cough, unspecified: Secondary | ICD-10-CM

## 2020-03-11 DIAGNOSIS — Z4659 Encounter for fitting and adjustment of other gastrointestinal appliance and device: Secondary | ICD-10-CM

## 2020-03-11 DIAGNOSIS — Z7901 Long term (current) use of anticoagulants: Secondary | ICD-10-CM

## 2020-03-11 DIAGNOSIS — R29718 NIHSS score 18: Secondary | ICD-10-CM | POA: Diagnosis present

## 2020-03-11 DIAGNOSIS — Z6826 Body mass index (BMI) 26.0-26.9, adult: Secondary | ICD-10-CM

## 2020-03-11 DIAGNOSIS — I129 Hypertensive chronic kidney disease with stage 1 through stage 4 chronic kidney disease, or unspecified chronic kidney disease: Secondary | ICD-10-CM | POA: Diagnosis present

## 2020-03-11 DIAGNOSIS — T17908A Unspecified foreign body in respiratory tract, part unspecified causing other injury, initial encounter: Secondary | ICD-10-CM

## 2020-03-11 DIAGNOSIS — E039 Hypothyroidism, unspecified: Secondary | ICD-10-CM | POA: Diagnosis present

## 2020-03-11 DIAGNOSIS — I10 Essential (primary) hypertension: Secondary | ICD-10-CM | POA: Diagnosis not present

## 2020-03-11 DIAGNOSIS — Z20822 Contact with and (suspected) exposure to covid-19: Secondary | ICD-10-CM | POA: Diagnosis present

## 2020-03-11 DIAGNOSIS — I6601 Occlusion and stenosis of right middle cerebral artery: Secondary | ICD-10-CM | POA: Diagnosis not present

## 2020-03-11 DIAGNOSIS — F0390 Unspecified dementia without behavioral disturbance: Secondary | ICD-10-CM | POA: Diagnosis present

## 2020-03-11 DIAGNOSIS — E43 Unspecified severe protein-calorie malnutrition: Secondary | ICD-10-CM | POA: Diagnosis present

## 2020-03-11 DIAGNOSIS — E785 Hyperlipidemia, unspecified: Secondary | ICD-10-CM | POA: Diagnosis present

## 2020-03-11 DIAGNOSIS — H9190 Unspecified hearing loss, unspecified ear: Secondary | ICD-10-CM | POA: Diagnosis present

## 2020-03-11 DIAGNOSIS — Z7982 Long term (current) use of aspirin: Secondary | ICD-10-CM

## 2020-03-11 DIAGNOSIS — G9349 Other encephalopathy: Secondary | ICD-10-CM | POA: Diagnosis not present

## 2020-03-11 DIAGNOSIS — Z515 Encounter for palliative care: Secondary | ICD-10-CM

## 2020-03-11 DIAGNOSIS — I6389 Other cerebral infarction: Secondary | ICD-10-CM | POA: Diagnosis not present

## 2020-03-11 DIAGNOSIS — Z79899 Other long term (current) drug therapy: Secondary | ICD-10-CM

## 2020-03-11 DIAGNOSIS — R069 Unspecified abnormalities of breathing: Secondary | ICD-10-CM | POA: Diagnosis present

## 2020-03-11 DIAGNOSIS — G8194 Hemiplegia, unspecified affecting left nondominant side: Secondary | ICD-10-CM | POA: Diagnosis present

## 2020-03-11 DIAGNOSIS — I63411 Cerebral infarction due to embolism of right middle cerebral artery: Principal | ICD-10-CM | POA: Diagnosis present

## 2020-03-11 DIAGNOSIS — Z7189 Other specified counseling: Secondary | ICD-10-CM | POA: Diagnosis not present

## 2020-03-11 DIAGNOSIS — N400 Enlarged prostate without lower urinary tract symptoms: Secondary | ICD-10-CM | POA: Diagnosis present

## 2020-03-11 HISTORY — PX: RADIOLOGY WITH ANESTHESIA: SHX6223

## 2020-03-11 HISTORY — PX: IR CT HEAD LTD: IMG2386

## 2020-03-11 HISTORY — PX: IR PERCUTANEOUS ART THROMBECTOMY/INFUSION INTRACRANIAL INC DIAG ANGIO: IMG6087

## 2020-03-11 LAB — DIFFERENTIAL
Abs Immature Granulocytes: 0.08 10*3/uL — ABNORMAL HIGH (ref 0.00–0.07)
Basophils Absolute: 0.1 10*3/uL (ref 0.0–0.1)
Basophils Relative: 2 %
Eosinophils Absolute: 0.2 10*3/uL (ref 0.0–0.5)
Eosinophils Relative: 4 %
Immature Granulocytes: 1 %
Lymphocytes Relative: 25 %
Lymphs Abs: 1.5 10*3/uL (ref 0.7–4.0)
Monocytes Absolute: 0.4 10*3/uL (ref 0.1–1.0)
Monocytes Relative: 7 %
Neutro Abs: 3.7 10*3/uL (ref 1.7–7.7)
Neutrophils Relative %: 61 %

## 2020-03-11 LAB — POCT I-STAT 7, (LYTES, BLD GAS, ICA,H+H)
Acid-base deficit: 4 mmol/L — ABNORMAL HIGH (ref 0.0–2.0)
Bicarbonate: 20.2 mmol/L (ref 20.0–28.0)
Calcium, Ion: 1.15 mmol/L (ref 1.15–1.40)
HCT: 33 % — ABNORMAL LOW (ref 39.0–52.0)
Hemoglobin: 11.2 g/dL — ABNORMAL LOW (ref 13.0–17.0)
O2 Saturation: 100 %
Patient temperature: 33.6
Potassium: 3.9 mmol/L (ref 3.5–5.1)
Sodium: 140 mmol/L (ref 135–145)
TCO2: 21 mmol/L — ABNORMAL LOW (ref 22–32)
pCO2 arterial: 28.1 mmHg — ABNORMAL LOW (ref 32.0–48.0)
pH, Arterial: 7.449 (ref 7.350–7.450)
pO2, Arterial: 243 mmHg — ABNORMAL HIGH (ref 83.0–108.0)

## 2020-03-11 LAB — MRSA PCR SCREENING: MRSA by PCR: NEGATIVE

## 2020-03-11 LAB — RESP PANEL BY RT-PCR (RSV, FLU A&B, COVID)  RVPGX2
Influenza A by PCR: NEGATIVE
Influenza B by PCR: NEGATIVE
Resp Syncytial Virus by PCR: NEGATIVE
SARS Coronavirus 2 by RT PCR: NEGATIVE

## 2020-03-11 LAB — COMPREHENSIVE METABOLIC PANEL
ALT: 16 U/L (ref 0–44)
AST: 18 U/L (ref 15–41)
Albumin: 3.4 g/dL — ABNORMAL LOW (ref 3.5–5.0)
Alkaline Phosphatase: 82 U/L (ref 38–126)
Anion gap: 9 (ref 5–15)
BUN: 23 mg/dL (ref 8–23)
CO2: 22 mmol/L (ref 22–32)
Calcium: 8.7 mg/dL — ABNORMAL LOW (ref 8.9–10.3)
Chloride: 105 mmol/L (ref 98–111)
Creatinine, Ser: 1.26 mg/dL — ABNORMAL HIGH (ref 0.61–1.24)
GFR, Estimated: 54 mL/min — ABNORMAL LOW (ref >60)
Glucose, Bld: 149 mg/dL — ABNORMAL HIGH (ref 70–99)
Potassium: 4.2 mmol/L (ref 3.5–5.1)
Sodium: 136 mmol/L (ref 135–145)
Total Bilirubin: 0.9 mg/dL (ref 0.3–1.2)
Total Protein: 5.9 g/dL — ABNORMAL LOW (ref 6.5–8.1)

## 2020-03-11 LAB — CBC
HCT: 41.2 % (ref 39.0–52.0)
Hemoglobin: 13 g/dL (ref 13.0–17.0)
MCH: 29.5 pg (ref 26.0–34.0)
MCHC: 31.6 g/dL (ref 30.0–36.0)
MCV: 93.6 fL (ref 80.0–100.0)
Platelets: 173 10*3/uL (ref 150–400)
RBC: 4.4 MIL/uL (ref 4.22–5.81)
RDW: 13.5 % (ref 11.5–15.5)
WBC: 6 10*3/uL (ref 4.0–10.5)
nRBC: 0 % (ref 0.0–0.2)

## 2020-03-11 LAB — I-STAT CHEM 8, ED
BUN: 24 mg/dL — ABNORMAL HIGH (ref 8–23)
Calcium, Ion: 1.2 mmol/L (ref 1.15–1.40)
Chloride: 106 mmol/L (ref 98–111)
Creatinine, Ser: 1.1 mg/dL (ref 0.61–1.24)
Glucose, Bld: 143 mg/dL — ABNORMAL HIGH (ref 70–99)
HCT: 40 % (ref 39.0–52.0)
Hemoglobin: 13.6 g/dL (ref 13.0–17.0)
Potassium: 4.1 mmol/L (ref 3.5–5.1)
Sodium: 139 mmol/L (ref 135–145)
TCO2: 21 mmol/L — ABNORMAL LOW (ref 22–32)

## 2020-03-11 LAB — PROTIME-INR
INR: 1.1 (ref 0.8–1.2)
Prothrombin Time: 13.7 seconds (ref 11.4–15.2)

## 2020-03-11 LAB — ECHOCARDIOGRAM COMPLETE
AR max vel: 2.01 cm2
AV Area VTI: 2.01 cm2
AV Area mean vel: 1.82 cm2
AV Mean grad: 1.7 mmHg
AV Peak grad: 2.9 mmHg
Ao pk vel: 0.84 m/s
Height: 72 in
P 1/2 time: 806 msec
S' Lateral: 3 cm
Weight: 2927.71 oz

## 2020-03-11 LAB — CBG MONITORING, ED: Glucose-Capillary: 148 mg/dL — ABNORMAL HIGH (ref 70–99)

## 2020-03-11 LAB — APTT: aPTT: 30 seconds (ref 24–36)

## 2020-03-11 SURGERY — IR WITH ANESTHESIA
Anesthesia: General

## 2020-03-11 MED ORDER — EPTIFIBATIDE 20 MG/10ML IV SOLN
INTRAVENOUS | Status: AC
  Filled 2020-03-11: qty 10

## 2020-03-11 MED ORDER — ASPIRIN 81 MG PO CHEW
CHEWABLE_TABLET | ORAL | Status: AC
  Filled 2020-03-11: qty 1

## 2020-03-11 MED ORDER — ROCURONIUM BROMIDE 100 MG/10ML IV SOLN
INTRAVENOUS | Status: DC | PRN
  Administered 2020-03-11: 50 mg via INTRAVENOUS

## 2020-03-11 MED ORDER — PHENYLEPHRINE 40 MCG/ML (10ML) SYRINGE FOR IV PUSH (FOR BLOOD PRESSURE SUPPORT)
PREFILLED_SYRINGE | INTRAVENOUS | Status: DC | PRN
  Administered 2020-03-11: 120 ug via INTRAVENOUS
  Administered 2020-03-11: 80 ug via INTRAVENOUS
  Administered 2020-03-11: 160 ug via INTRAVENOUS

## 2020-03-11 MED ORDER — PROPOFOL 1000 MG/100ML IV EMUL
0.0000 ug/kg/min | INTRAVENOUS | Status: DC
  Administered 2020-03-11: 14:00:00 50 ug/kg/min via INTRAVENOUS
  Administered 2020-03-12: 05:00:00 20 ug/kg/min via INTRAVENOUS
  Administered 2020-03-12: 40 ug/kg/min via INTRAVENOUS
  Administered 2020-03-12 – 2020-03-13 (×2): 30 ug/kg/min via INTRAVENOUS
  Filled 2020-03-11 (×8): qty 100

## 2020-03-11 MED ORDER — ACETAMINOPHEN 650 MG RE SUPP: 650.0000 mg | RECTAL | Status: DC | PRN

## 2020-03-11 MED ORDER — CLEVIDIPINE BUTYRATE 0.5 MG/ML IV EMUL
0.0000 mg/h | INTRAVENOUS | Status: DC
  Administered 2020-03-11: 15:00:00 1 mg/h via INTRAVENOUS
  Filled 2020-03-11: qty 50

## 2020-03-11 MED ORDER — IOHEXOL 350 MG/ML SOLN
75.0000 mL | Freq: Once | INTRAVENOUS | Status: AC | PRN
  Administered 2020-03-11: 10:00:00 75 mL via INTRAVENOUS

## 2020-03-11 MED ORDER — SODIUM CHLORIDE 0.9 % IV SOLN
50.0000 mL/h | INTRAVENOUS | Status: DC
  Administered 2020-03-11: 14:00:00 50 mL/h via INTRAVENOUS

## 2020-03-11 MED ORDER — CLEVIDIPINE BUTYRATE 0.5 MG/ML IV EMUL
0.0000 mg/h | INTRAVENOUS | Status: AC
  Administered 2020-03-11: 15:00:00 1 mg/h via INTRAVENOUS
  Filled 2020-03-11: qty 50

## 2020-03-11 MED ORDER — SODIUM CHLORIDE 0.9 % IV SOLN: INTRAVENOUS | Status: DC | PRN

## 2020-03-11 MED ORDER — CANGRELOR TETRASODIUM 50 MG IV SOLR
INTRAVENOUS | Status: AC
  Filled 2020-03-11: qty 50

## 2020-03-11 MED ORDER — TICAGRELOR 90 MG PO TABS
ORAL_TABLET | ORAL | Status: AC
  Filled 2020-03-11: qty 2

## 2020-03-11 MED ORDER — SODIUM CHLORIDE 0.9 % IV SOLN: INTRAVENOUS | Status: DC

## 2020-03-11 MED ORDER — CEFAZOLIN SODIUM-DEXTROSE 2-3 GM-%(50ML) IV SOLR
INTRAVENOUS | Status: DC | PRN
  Administered 2020-03-11: 2 g via INTRAVENOUS

## 2020-03-11 MED ORDER — PHENYLEPHRINE HCL-NACL 10-0.9 MG/250ML-% IV SOLN
INTRAVENOUS | Status: DC | PRN
  Administered 2020-03-11: 25 ug/min via INTRAVENOUS

## 2020-03-11 MED ORDER — FENTANYL CITRATE (PF) 100 MCG/2ML IJ SOLN: 25.0000 ug | INTRAMUSCULAR | Status: DC | PRN

## 2020-03-11 MED ORDER — ONDANSETRON HCL 4 MG/2ML IJ SOLN
INTRAMUSCULAR | Status: DC | PRN
  Administered 2020-03-11: 4 mg via INTRAVENOUS

## 2020-03-11 MED ORDER — SODIUM CHLORIDE 0.9% FLUSH: 3.0000 mL | Freq: Once | INTRAVENOUS | Status: DC

## 2020-03-11 MED ORDER — LIDOCAINE 2% (20 MG/ML) 5 ML SYRINGE
INTRAMUSCULAR | Status: DC | PRN
  Administered 2020-03-11: 80 mg via INTRAVENOUS

## 2020-03-11 MED ORDER — FENTANYL CITRATE (PF) 100 MCG/2ML IJ SOLN
INTRAMUSCULAR | Status: AC
  Filled 2020-03-11: qty 2

## 2020-03-11 MED ORDER — ACETAMINOPHEN 160 MG/5ML PO SOLN
650.0000 mg | ORAL | Status: DC | PRN
  Administered 2020-03-15 – 2020-03-17 (×2): 650 mg
  Filled 2020-03-11 (×2): qty 20.3

## 2020-03-11 MED ORDER — STROKE: EARLY STAGES OF RECOVERY BOOK
Freq: Once | Status: AC
  Filled 2020-03-11: qty 1

## 2020-03-11 MED ORDER — CEFAZOLIN SODIUM-DEXTROSE 2-4 GM/100ML-% IV SOLN
INTRAVENOUS | Status: AC
  Filled 2020-03-11: qty 100

## 2020-03-11 MED ORDER — ACETAMINOPHEN 160 MG/5ML PO SOLN: 650.0000 mg | ORAL | Status: DC | PRN

## 2020-03-11 MED ORDER — ORAL CARE MOUTH RINSE
15.0000 mL | OROMUCOSAL | Status: DC
  Administered 2020-03-11 – 2020-03-18 (×66): 15 mL via OROMUCOSAL

## 2020-03-11 MED ORDER — FENTANYL CITRATE (PF) 250 MCG/5ML IJ SOLN
INTRAMUSCULAR | Status: DC | PRN
  Administered 2020-03-11: 50 ug via INTRAVENOUS

## 2020-03-11 MED ORDER — CHLORHEXIDINE GLUCONATE CLOTH 2 % EX PADS
6.0000 | MEDICATED_PAD | Freq: Every day | CUTANEOUS | Status: DC
  Administered 2020-03-13 – 2020-03-23 (×8): 6 via TOPICAL

## 2020-03-11 MED ORDER — DEXAMETHASONE SODIUM PHOSPHATE 10 MG/ML IJ SOLN
INTRAMUSCULAR | Status: DC | PRN
  Administered 2020-03-11: 10 mg via INTRAVENOUS

## 2020-03-11 MED ORDER — SODIUM CHLORIDE 0.9 % IV SOLN: 50.0000 mL | Freq: Once | INTRAVENOUS | Status: DC

## 2020-03-11 MED ORDER — PROPOFOL 10 MG/ML IV BOLUS
INTRAVENOUS | Status: DC | PRN
  Administered 2020-03-11: 120 mg via INTRAVENOUS

## 2020-03-11 MED ORDER — IOHEXOL 240 MG/ML SOLN
INTRAMUSCULAR | Status: AC
  Filled 2020-03-11: qty 200

## 2020-03-11 MED ORDER — NITROGLYCERIN 1 MG/10 ML FOR IR/CATH LAB
INTRA_ARTERIAL | Status: AC
  Administered 2020-03-11: 11:00:00 25 ug via INTRA_ARTERIAL
  Filled 2020-03-11: qty 10

## 2020-03-11 MED ORDER — ALBUMIN HUMAN 5 % IV SOLN: INTRAVENOUS | Status: DC | PRN

## 2020-03-11 MED ORDER — PANTOPRAZOLE SODIUM 40 MG IV SOLR
40.0000 mg | Freq: Every day | INTRAVENOUS | Status: DC
  Administered 2020-03-11 – 2020-03-12 (×2): 40 mg via INTRAVENOUS
  Filled 2020-03-11 (×3): qty 40

## 2020-03-11 MED ORDER — SUCCINYLCHOLINE CHLORIDE 20 MG/ML IJ SOLN
INTRAMUSCULAR | Status: DC | PRN
  Administered 2020-03-11: 120 mg via INTRAVENOUS

## 2020-03-11 MED ORDER — CHLORHEXIDINE GLUCONATE 0.12% ORAL RINSE (MEDLINE KIT)
15.0000 mL | Freq: Two times a day (BID) | OROMUCOSAL | Status: DC
  Administered 2020-03-11 – 2020-03-25 (×28): 15 mL via OROMUCOSAL

## 2020-03-11 MED ORDER — ACETAMINOPHEN 325 MG PO TABS: 650.0000 mg | ORAL_TABLET | ORAL | Status: DC | PRN

## 2020-03-11 MED ORDER — TIROFIBAN HCL IN NACL 5-0.9 MG/100ML-% IV SOLN
INTRAVENOUS | Status: AC
  Filled 2020-03-11: qty 100

## 2020-03-11 MED ORDER — PROPOFOL 500 MG/50ML IV EMUL
INTRAVENOUS | Status: DC | PRN
  Administered 2020-03-11: 30 ug/kg/min via INTRAVENOUS

## 2020-03-11 MED ORDER — CLOPIDOGREL BISULFATE 300 MG PO TABS
ORAL_TABLET | ORAL | Status: AC
  Filled 2020-03-11: qty 1

## 2020-03-11 MED ORDER — ALTEPLASE (STROKE) FULL DOSE INFUSION
0.9000 mg/kg | Freq: Once | INTRAVENOUS | Status: AC
  Administered 2020-03-11: 74.7 mg via INTRAVENOUS
  Filled 2020-03-11: qty 100

## 2020-03-11 MED ORDER — DOCUSATE SODIUM 50 MG/5ML PO LIQD
100.0000 mg | Freq: Two times a day (BID) | ORAL | Status: DC
  Administered 2020-03-13 – 2020-03-14 (×3): 100 mg
  Filled 2020-03-11 (×3): qty 10

## 2020-03-11 MED ORDER — VERAPAMIL HCL 2.5 MG/ML IV SOLN
INTRAVENOUS | Status: AC
  Filled 2020-03-11: qty 2

## 2020-03-11 MED ORDER — SODIUM CHLORIDE 0.9 % IV SOLN
3.0000 g | Freq: Three times a day (TID) | INTRAVENOUS | Status: DC
  Administered 2020-03-11 – 2020-03-14 (×9): 3 g via INTRAVENOUS
  Filled 2020-03-11 (×3): qty 3
  Filled 2020-03-11: qty 8
  Filled 2020-03-11 (×5): qty 3
  Filled 2020-03-11: qty 8
  Filled 2020-03-11: qty 3

## 2020-03-11 NOTE — Anesthesia Preprocedure Evaluation (Addendum)
Anesthesia Evaluation  Patient identified by MRN, date of birth, ID band Patient unresponsive    Reviewed: Allergy & Precautions, NPO status , Patient's Chart, lab work & pertinent test results, Unable to perform ROS - Chart review onlyPreop documentation limited or incomplete due to emergent nature of procedure.  Airway Mallampati: II  TM Distance: >3 FB Neck ROM: Full    Dental  (+) Teeth Intact   Pulmonary neg pulmonary ROS,    Pulmonary exam normal breath sounds clear to auscultation       Cardiovascular hypertension, + dysrhythmias Atrial Fibrillation  Rhythm:Irregular Rate:Abnormal     Neuro/Psych Nontraumatic subdural hematoma  CVA    GI/Hepatic negative GI ROS, Neg liver ROS,   Endo/Other  Hypothyroidism   Renal/GU negative Renal ROS     Musculoskeletal negative musculoskeletal ROS (+)   Abdominal   Peds  Hematology negative hematology ROS (+)   Anesthesia Other Findings Day of surgery medications reviewed with the patient.  Reproductive/Obstetrics                             Anesthesia Physical Anesthesia Plan  ASA: IV and emergent  Anesthesia Plan: General   Post-op Pain Management:    Induction: Intravenous and Rapid sequence  PONV Risk Score and Plan: 2 and Treatment may vary due to age or medical condition  Airway Management Planned: Oral ETT and Video Laryngoscope Planned  Additional Equipment: Arterial line  Intra-op Plan:   Post-operative Plan: Post-operative intubation/ventilation  Informed Consent:   Patient has DNR.  Discussed DNR with patient and Continue DNR.   History available from chart only and Only emergency history available  Plan Discussed with: CRNA  Anesthesia Plan Comments: (CODE STROKE. Pre-op evaluation completed after induction due to emergent nature of the procedure.)       Anesthesia Quick Evaluation

## 2020-03-11 NOTE — Anesthesia Postprocedure Evaluation (Signed)
Anesthesia Post Note  Patient: Jeffrey Ashley  Procedure(s) Performed: IR WITH ANESTHESIA (N/A )     Patient location during evaluation: SICU Anesthesia Type: General Level of consciousness: sedated Pain management: pain level controlled Vital Signs Assessment: post-procedure vital signs reviewed and stable Respiratory status: patient remains intubated per anesthesia plan Cardiovascular status: stable Postop Assessment: no apparent nausea or vomiting Anesthetic complications: no   No complications documented.  Last Vitals:  Vitals:   April 05, 2020 1556 2020-04-05 1600  BP:  (!) 131/97  Pulse:  71  Resp:  16  Temp:  (!) 33.7 C  SpO2: 100% 100%    Last Pain:  Vitals:   April 05, 2020 1343  TempSrc: Rectal   Pain Goal:                   Cecile Hearing

## 2020-03-11 NOTE — H&P (Signed)
Neurology H&P  CC: Left-sided weakness  History is obtained from: EMS, son  HPI: Jeffrey Ashley is a 86 y.o. male with a history of subdural hematoma in 2013 as well as atrial fibrillation not on anticoagulation who presents with left left-sided weakness that was discovered at 8:20 AM.  He was seen to be normal and talking as he walked down to breakfast at 7:55 AM.  The patient agrees that he was normal when he first woke up.  He is very dysarthric and difficult to understand therefore history from the patient is limited.  He also vomited en route and was suspected to have aspirated.  He presented to the emergency department as a code stroke.  He is DNR in the setting of cardiac arrest, but after CT/CTA revealed right MCA occlusion, I discussed the risks and benefits of IV TPA with both Thayer Ohm and Renae Fickle both agreed with proceeding.  I did indicate that there was likely a higher risk of hemorrhagic conversion in anyone with a previous intracranial hemorrhage, but given that it was a likely traumatic event in the past has precipitant, felt that it was not a contraindication to TPA.  They agreed with proceeding.  After a CT angiogram was performed, and he was deemed a potential interventional candidate, I discussed with the family what his baseline was like.  He is fairly independent, able to walk up to a mile, though he does use a walker due to some hip pain.  He has significant quality of life and after discussion of risks and potential benefits of mechanical thrombectomy   LKW: 7:55 AM tpa given?:  Yes IR Thrombectomy?  Yes Modified Rankin Scale: 2-Slight disability-UNABLE to perform all activities but does not need assistance NIHSS: 18   ROS: A complete ROS was performed and is negative except as noted in the HPI.   Past Medical History:  Diagnosis Date  . 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 Dysrhythmia    AFIB  . HOH (hard of hearing)   . Hypertension   .  Hypothyroidism   . Nontraumatic subdural hematoma (HCC)   . 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 Colon cancer Sister      Social History:  reports that he has never smoked. He has never used smokeless tobacco. He reports previous alcohol use. He reports that he does not use drugs.   Prior to Admission medications   Medication Sig Start Date End Date Taking? Authorizing Provider  aspirin 81 MG EC tablet Take by mouth. 03/22/19   [provider]  carvedilol (COREG) 25 MG tablet Take 1 tablet (25 mg total) by mouth 2 (two) times daily. 08/20/19 09/19/19  Charise Killian, MD  Cholecalciferol (VITAMIN D-3) 5000 units TABS Take 5,000 Units by mouth daily.    [provider]  finasteride (PROSCAR) 5 MG tablet Take 1 tablet (5 mg total) by mouth daily. 01/30/20   Stoioff, Verna Czech, MD  latanoprost (XALATAN) 0.005 % ophthalmic solution Apply 1 drop to eye at bedtime.    [provider]  levothyroxine (SYNTHROID, LEVOTHROID) 50 MCG tablet Take 50 mcg by mouth daily before breakfast. 02/11/17   [provider]  Multiple Vitamins-Minerals (PRESERVISION AREDS 2 PO) Take by mouth 2 (two) times daily.    [provider]  timolol (TIMOPTIC) 0.5 % ophthalmic solution Place 1 drop into both eyes at bedtime.  [provider]  vitamin B-12 (CYANOCOBALAMIN) 500 MCG tablet Take 500 mcg by mouth 2 (two) times a week.    [provider]     Exam: Current vital signs: BP (!) 169/91   Pulse 78   Resp 20   Wt 83 kg   SpO2 98%   BMI 24.82 kg/m    Physical Exam  Constitutional: Appears elderly Psych: Affect appropriate to situation Eyes: No scleral injection HENT: No OP obstrucion Head: Normocephalic.  Cardiovascular: Normal rate and regular rhythm.  Respiratory: Effort normal and breath sounds normal to anterior ascultation GI: Soft.  No distension. There is no tenderness.  Skin:  WDI  Neuro: Mental Status: Patient is awake, alert, he is severely dysarthric but able to follow commands and the speech that I am able to make out is appropriate. He has a significant left hemineglect Cranial Nerves: II: Does not blink to threat from the left.  III,IV, VI: He does not cross midline to the left V: Facial sensation is diminished on the left VII: Facial movement with left facial weakness VIII: hearing is intact to voice X: Uvula elevates symmetrically XI: Shoulder shrug is symmetric. XII: tongue is midline without atrophy or fasciculations.  Motor: Severe left hemiparesis, 2/5 weakness of both the arm and leg, moves the right side well Sensory: Sensation is diminished on the left Cerebellar: Does not perform  I have reviewed labs in epic and the pertinent results are: Creatinine 1.26  I have reviewed the images obtained:   Primary Diagnosis:  Cerebral infarction due to embolism of  right middle cerebral artery.   Secondary Diagnosis: Essential (primary) hypertension   Impression: 86 year old male with a history of atrial fibrillation not on anticoagulation due to previous subdural hematoma who presents with large right MCA occlusion.  Risks and benefits of thrombectomy have discussed with the son who agrees.  Plan: - HgbA1c, fasting lipid panel - MRI, MRA  of the brain without contrast - Frequent neuro checks - Echocardiogram - Prophylactic therapy-Antiplatelet med: Aspirin - dose 325mg  PO or 300mg  PR - Risk factor modification - Telemetry monitoring - PT consult, OT consult, Speech consult - Stroke team to follow    This patient is critically ill and at significant risk of neurological worsening, death and care requires constant monitoring of vital signs, hemodynamics,respiratory and cardiac monitoring, neurological assessment, discussion with family, other specialists and medical decision making of high complexity. I spent 50 minutes of neurocritical  care time  in the care of  this patient. This was time spent independent of any time provided by nurse practitioner or PA.  , MD [facility] [phone]  If 7pm- 7am, please page neurology on call as listed in AMION.

## 2020-03-11 NOTE — Progress Notes (Signed)
Pharmacy Antibiotic Note  Jeffrey Ashley is a 86 y.o. male admitted on 03/25/2020 with aspiration PNA.  Pharmacy has been consulted for Unasyn dosing. SCr 1.26.  Plan: Unasyn 3g IV q8h Monitor clinical progress, c/s, renal function F/u de-escalation plan/LOT  Weight: 83 kg (182 lb 15.7 oz)  Temp (24hrs), Avg:93.8 F (34.3 C), Min:93.8 F (34.3 C), Max:93.8 F (34.3 C)  Recent Labs  Lab 03/10/2020 0928 03/08/2020 0933  WBC  --  6.0  CREATININE 1.10 1.26*    Estimated Creatinine Clearance: 41.9 mL/min (A) (by C-G formula based on SCr of 1.26 mg/dL (H)).    Allergies  Allergen Reactions  . Brimonidine Tartrate     USING EYE DROP  . Combigan [Brimonidine Tartrate-Timolol] Other (See Comments)    Unknown reaction.    Leia Alf, PharmD, BCPS Please check AMION for all [facility] contact numbers Clinical Pharmacist 03/28/2020 2:01 PM

## 2020-03-11 NOTE — Progress Notes (Signed)
Spoke with Dr. Corliss Skains, he is aware of pts weak and absent (R dorsalis) pulses. He verbalized that he wants pts SBP on the higher end of 140, and ok to go to 150's; avoiding the lower range of 110-120's.

## 2020-03-11 NOTE — ED Notes (Signed)
Pt in rrom 27

## 2020-03-11 NOTE — Consult Note (Signed)
INR. 91  RT M MRS 2 LSW 7.55 am. New onset of Lt sided  weakness and neglect. CT brain ASPECTS 10. NO ICH  CTA occluded RT MCA M1 seg  IVTPA started. Endovascular revascularization  treament discussed with son over the phone in a 3 way witnessed call  Procedure,reasonsand alternatives reviewed. Risks of ICH of 10 % worsening neuro condition,death and inability to revascularize were reviewed. The son expressed understanding and provided consent to proceed. S.Vyom Brass MD

## 2020-03-11 NOTE — Progress Notes (Signed)
Pharmacist Code Stroke Response  Notified to mix tPA at 0935 by Dr. Amada Jupiter Delivered tPA to RN at [phone]  tPA dose = 7.5mg  bolus over 1 minute followed by 67.2mg  for a total dose of 74.7mg  over 1 hour  Issues/delays encountered (if applicable): None  Vinnie Level, PharmD., BCPS, BCCCP Clinical Pharmacist Please refer to [facility] for unit-specific pharmacist

## 2020-03-11 NOTE — Consult Note (Signed)
NAME:  Jeffrey Ashley, MRN:  [id number], DOB:  03/06/1927, LOS: 0 ADMISSION DATE:  2020-04-06, CONSULTATION DATE: 03/12/2019 REFERRING MD: Neuro interventionist, CHIEF COMPLAINT: Ventilator dependent respiratory failure  Brief History   86 year old with acute onset of left-sided weakness  History of present illness   Jeffrey Ashley is a 86 year old male who is brought in from nursing facility with acute onset of left-sided weakness.  He was taken as a code stroke to neuro interventional radiology at which time he was intubated with indication of aspiration.  Following neurological intervention right MCA with thrombectomy and instillation of lytics he was left in interventional radiology suite.  We await a Covid test at this time.  Note he had negative Covid and May 2021.  His past medical history is as below.  Pulmonary critical care asked to admit care for patient until stabilized.  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 rate 20, weight 83 kg, SpO2 97 %.        Intake/Output Summary (Last 24 hours) at 06-Apr-2020 1248 Last data filed at April 06, 2020 1200 Gross per 24 hour  Intake 150 ml  Output --  Net 150 ml   Filed Weights   2020/04/06 0900  2020/04/06 5003  Weight: 83 kg 83 kg    Examination: General: Elderly male is heavily sedated with dipper Van and full mechanical ventilatory support HENT: Endotracheal tube is in place.  Esophageal stethoscope is noted. Lungs: Vented throughout Cardiovascular: Heart sounds regular at this time Abdomen: Soft nontender Extremities: Without edema Neuro: Currently sedated on with propofol   Resolved Hospital Problem list     Assessment & Plan:  Ventilator dependent respiratory failure secondary to code stroke and suspected aspiration. Ventilator bundle Pulmonary toilet Empirical microbial therapy with Unasyn Bronchodilators Wean per protocol in a.m.  Code stroke called 06-Apr-2020 taken to interventional radiology with right MCA occlusion with thrombectomy and endovascular injection of lytics Admit to the neurological intensive care unit Per neurology Frequent neuro checks  History of atrial fibrillation Continue to monitor  Unknown Covid status at the time of this dictation Await Covid testing results none as of 1 PM on 2020/04/06   Best practice (evaluated daily)   Diet: npo Pain/Anxiety/Delirium protocol (if indicated): As indicated VAP protocol (if indicated): Aspirated therefore will be on Unasyn DVT prophylaxis: Compression hose GI prophylaxis: PPI Glucose control: Pain scale insulin protocol Mobility: Bedrest last date of multidisciplinary goals of care discussion Family and staff present  Summary of discussion  Follow up goals of care discussion due soon Code Status: Partial code no CPR no shock Disposition: Intensive care unit Apr 06, 2020 attempted to call son Jeffrey Ashley on mobile and home phone without success.  Labs   CBC: Recent Labs  Lab  03/30/2020 0928 03/16/2020 0933  WBC  --  6.0  NEUTROABS  --  3.7  HGB 13.6 13.0  HCT 40.0 41.2  MCV  --  93.6  PLT  --  173    Basic Metabolic Panel: Recent Labs  Lab 03/08/2020 0928 03/13/2020 0933  NA 139  136  K 4.1 4.2  CL 106 105  CO2  --  22  GLUCOSE 143* 149*  BUN 24* 23  CREATININE 1.10 1.26*  CALCIUM  --  8.7*   GFR: Estimated Creatinine Clearance: 41.9 mL/min (A) (by C-G formula based on SCr of 1.26 mg/dL (H)). Recent Labs  Lab 03/27/2020 0933  WBC 6.0    Liver Function Tests: Recent Labs  Lab 03/08/2020 0933  AST 18  ALT 16  ALKPHOS 82  BILITOT 0.9  PROT 5.9*  ALBUMIN 3.4*   No results for input(s): LIPASE, AMYLASE in the last 168 hours. No results for input(s): AMMONIA in the last 168 hours.  ABG    Component Value Date/Time   TCO2 21 (L) 03/06/2020 0928     Coagulation Profile: Recent Labs  Lab 03/13/2020 0933  INR 1.1    Cardiac Enzymes: No results for input(s): CKTOTAL, CKMB, CKMBINDEX, TROPONINI in the last 168 hours.  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   Surgical History    Past Surgical History:  Procedure Laterality Date   APPENDECTOMY     BRAIN SURGERY     CRANIO   CATARACT EXTRACTION W/PHACO Left 03/06/2017   Procedure: CATARACT EXTRACTION PHACO AND INTRAOCULAR LENS PLACEMENT (IOC);  Surgeon: Nevada Crane, MD;  Location: [facility];  Service: Ophthalmology;  Laterality: Left;  [place] 01:10.8 AP% 18.8 CDE 13.39 FLUID PACK LOT # [id number]   CATARACT EXTRACTION W/PHACO Right 08/16/2019   Procedure: CATARACT EXTRACTION PHACO AND INTRAOCULAR LENS PLACEMENT (IOC) RIGHT ISTENT INJ 8.84  01:03.5;  Surgeon: Nevada Crane, MD;  Location: [facility];  Service: Ophthalmology;  Laterality: Right;   CHOLECYSTECTOMY     INTRAMEDULLARY (IM) NAIL INTERTROCHANTERIC Left 08/18/2019   Procedure: INTRAMEDULLARY (IM) NAIL  INTERTROCHANTRIC;  Surgeon: Lyndle Herrlich, MD;  Location: [facility];  Service: Orthopedics;  Laterality: Left;   Kidney Tumor Removal       Social History   reports that he has never smoked. He has never used smokeless tobacco. He reports previous alcohol use. He reports that he does not use drugs.   Family History   His family history includes Colon cancer in his sister.   Allergies Allergies  Allergen Reactions   Brimonidine Tartrate     USING EYE DROP   Combigan [Brimonidine Tartrate-Timolol] Other (See Comments)    Unknown reaction.     Home Medications  Prior to Admission medications   Medication Sig Start Date End Date Taking? Authorizing Provider  aspirin 81 MG EC tablet Take by mouth. 03/22/19   [provider]  carvedilol (COREG) 25 MG tablet Take 1 tablet (25 mg total) by mouth 2 (two) times daily. 08/20/19 09/19/19  Charise Killian, MD  Cholecalciferol (VITAMIN D-3) 5000 units TABS Take 5,000 Units by mouth daily.    [provider]  finasteride (PROSCAR) 5  MG tablet Take 1 tablet (5 mg total) by mouth daily. 01/30/20   Stoioff, Verna Czech, MD  latanoprost (XALATAN) 0.005 % ophthalmic solution Apply 1 drop to eye at bedtime.    [provider]  levothyroxine (SYNTHROID, LEVOTHROID) 50 MCG tablet Take 50 mcg by mouth daily before breakfast. 02/11/17   [provider]  Multiple Vitamins-Minerals (PRESERVISION AREDS 2 PO) Take by mouth 2 (two) times daily.    [provider]  timolol (TIMOPTIC) 0.5 % ophthalmic solution Place 1 drop into both eyes at bedtime.    [provider]  vitamin B-12 (CYANOCOBALAMIN) 500 MCG tablet Take 500 mcg by mouth 2 (two) times a week.    [provider]     Critical care time: 45 min     Brett Canales Tykira Wachs ACNP Acute Care Nurse Practitioner [facility] Pulmonary/Critical Care Please consult Amion 03/27/2020, 12:48 PM

## 2020-03-11 NOTE — Procedures (Addendum)
S/P RT common carotid arteriogram . RT CFA approach. S/P complete revascularization of RT MCA M1  Occlusion with x 1 pass with Tiger17 retriever and prox aspiration achieving a TICI 3 revascularization. 31F angioseal for hemostasis  In the RT groin. Distal pulses RT PT and Lt DP and PT dopplerable. Post CT brain No ICH or mass effect. patient left intubated because of suspected aspiration. S.Sofi Bryars MD

## 2020-03-11 NOTE — Progress Notes (Signed)
SLP Cancellation Note  Patient Details Name: Jeffrey Ashley MRN: [id number] DOB: 12/01/1926   Cancelled treatment:       Reason Eval/Treat Not Completed: Patient not medically ready (on vent). Will f/u for cognitive evaluation as able.   Mahala Menghini., M.A. CCC-SLP Acute Rehabilitation Services Pager [phone] Office [phone]  03/08/2020, 1:44 PM

## 2020-03-11 NOTE — ED Provider Notes (Signed)
[facility] EMERGENCY DEPARTMENT Provider Note   CSN: [id number] Arrival date & time: 2020-03-30  7654  An emergency department physician performed an initial assessment on this suspected stroke patient at [phone].  History Chief Complaint  Patient presents with   Code Stroke   Level 5 caveat due to acuity of condition Jeffrey Ashley is a 86 y.o. male.  The history is provided by the EMS personnel.  Weakness Severity:  Severe Onset quality:  Sudden Timing:  Constant Progression:  Worsening Chronicity:  New Relieved by:  Nothing Worsened by:  Nothing Patient presents from nursing facility for abrupt onset of weakness.  This occurred this prior to arrival.  Patient noted to have left-sided weakness and had vomiting. 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    Patient Active Problem List   Diagnosis Date Noted   Glaucoma 12/09/2019   Atrial fibrillation, chronic (HCC) 08/17/2019   Essential hypertension 08/17/2019   Hypothyroidism 08/17/2019    Past Surgical History:  Procedure Laterality Date   APPENDECTOMY     BRAIN SURGERY     CRANIO   CATARACT EXTRACTION W/PHACO Left 03/06/2017   Procedure: CATARACT EXTRACTION PHACO AND INTRAOCULAR LENS PLACEMENT (IOC);  Surgeon: Nevada Crane, MD;  Location: [facility] ORS;  Service: Ophthalmology;  Laterality: Left;  [place] 01:10.8 AP% 18.8 CDE 13.39 FLUID PACK LOT # [id number]   CATARACT EXTRACTION W/PHACO Right 08/16/2019   Procedure: CATARACT EXTRACTION PHACO AND INTRAOCULAR LENS PLACEMENT (IOC) RIGHT ISTENT INJ 8.84  01:03.5;  Surgeon: Nevada Crane, MD;  Location: [facility];  Service: Ophthalmology;  Laterality: Right;   CHOLECYSTECTOMY      INTRAMEDULLARY (IM) NAIL INTERTROCHANTERIC Left 08/18/2019   Procedure: INTRAMEDULLARY (IM) NAIL INTERTROCHANTRIC;  Surgeon: Lyndle Herrlich, MD;  Location: [facility] ORS;  Service: Orthopedics;  Laterality: Left;   Kidney Tumor Removal         Family History  Problem Relation Age of Onset   Colon cancer Sister     Social History   Tobacco Use   Smoking status: Never Smoker   Smokeless tobacco: Never Used  Vaping Use   Vaping Use: Never used  Substance Use Topics   Alcohol use: Not Currently    Comment: OCCAS   Drug use: No    Home Medications Prior to Admission medications   Medication Sig Start Date End Date Taking? Authorizing Provider  aspirin 81 MG EC tablet Take by mouth. 03/22/19   [provider]  carvedilol (COREG) 25 MG tablet Take 1 tablet (25 mg total) by mouth 2 (two) times daily. 08/20/19 09/19/19  Charise Killian, MD  Cholecalciferol (VITAMIN D-3) 5000 units TABS Take 5,000 Units by mouth daily.    [provider]  finasteride (PROSCAR) 5 MG tablet Take 1 tablet (5 mg total) by mouth daily. 01/30/20   Stoioff, Verna Czech, MD  latanoprost (XALATAN) 0.005 % ophthalmic solution Apply 1 drop to eye at bedtime.    [provider]  levothyroxine (SYNTHROID, LEVOTHROID) 50 MCG tablet Take 50 mcg by mouth daily before breakfast. 02/11/17   [provider]  Multiple Vitamins-Minerals (PRESERVISION AREDS 2 PO) Take  by mouth 2 (two) times daily.    [provider]  timolol (TIMOPTIC) 0.5 % ophthalmic solution Place 1 drop into both eyes at bedtime.    [provider]  vitamin B-12 (CYANOCOBALAMIN) 500 MCG tablet Take 500 mcg by mouth 2 (two) times a week.    [provider]    Allergies    Brimonidine tartrate and Combigan [brimonidine tartrate-timolol]  Review of Systems   Review of Systems  Unable to perform ROS: Acuity of condition  Neurological: Positive for weakness.    Physical Exam Updated  Vital Signs BP (!) 125/106    Pulse 66    Resp 20    Wt 83 kg    SpO2 98%    BMI 24.82 kg/m   Physical Exam CONSTITUTIONAL: Elderly and ill-appearing, covered in vomit HEAD: Normocephalic/atraumatic EYES: PERRL ENMT: Mucous membranes moist NECK: supple no meningeal signs CV: Irregular, no loud murmur LUNGS: Lungs are clear to auscultation bilaterally, no apparent distress ABDOMEN: soft, nontender NEURO: Pt is awake/alert and will respond to voice.  Patient is nonverbal.  He has dense left hemiparesis. EXTREMITIES: pulses normal/equal, full ROM, no deformities noted SKIN: warm, color normal PSYCH: Unable to assess  ED Results / Procedures / Treatments   Labs (all labs ordered are listed, but only abnormal results are displayed) Labs Reviewed  DIFFERENTIAL - Abnormal; Notable for the following components:      Result Value   Abs Immature Granulocytes 0.08 (*)    All other components within normal limits  CBG MONITORING, ED - Abnormal; Notable for the following components:   Glucose-Capillary 148 (*)    All other components within normal limits  I-STAT CHEM 8, ED - Abnormal; Notable for the following components:   BUN 24 (*)    Glucose, Bld 143 (*)    TCO2 21 (*)    All other components within normal limits  PROTIME-INR  APTT  CBC  COMPREHENSIVE METABOLIC PANEL  CBG MONITORING, ED    EKG EKG Interpretation  Date/Time:  Saturday March 11 2020 09:45:16 EST Ventricular Rate:  81 PR Interval:    QRS Duration: 107 QT Interval:  407 QTC Calculation: 473 R Axis:   76 Text Interpretation: Atrial fibrillation Low voltage, extremity leads Interpretation limited secondary to artifact Confirmed by Zadie RhineWickline, Junius Faucett ([id number]) on 03/28/2020 9:56:55 AM   Radiology CT Code Stroke CTA Head W/WO contrast  Result Date: 03/15/2020 CLINICAL DATA:  Focal neuro deficit, > 6 hrs, stroke suspected EXAM: CT ANGIOGRAPHY HEAD AND NECK TECHNIQUE: Multidetector CT imaging of the head and  neck was performed using the standard protocol during bolus administration of intravenous contrast. Multiplanar CT image reconstructions and MIPs were obtained to evaluate the vascular anatomy. Carotid stenosis measurements (when applicable) are obtained utilizing NASCET criteria, using the distal internal carotid diameter as the denominator. CONTRAST:  75mL OMNIPAQUE IOHEXOL 350 MG/ML SOLN COMPARISON:  03/07/2020 head CT and prior. FINDINGS: CTA NECK FINDINGS Aortic arch: Mild aortic arch atherosclerotic calcifications. No dissection or aneurysm. Patent great vessel origins. Standard branching. Right carotid system: Patent. Carotid bifurcation calcified and noncalcified atheromatous disease with 50% proximal ICA luminal narrowing. Left carotid system: Patent. Carotid bifurcation calcified and noncalcified atheromatous disease with approximately 30% luminal narrowing. Vertebral arteries: Codominant. Patent. Mild to moderate left vertebral artery origin narrowing secondary to noncalcified atheromatous plaque. Skeleton: No acute finding.  Multilevel spondylosis. Other neck: No adenopathy.  No soft tissue mass. Upper chest: Minimal subsegmental atelectasis.  No acute finding. Review of the  MIP images confirms the above findings CTA HEAD FINDINGS Anterior circulation: Right M1 segment large vessel occlusion. Patent ICAs. Bilateral carotid siphon atherosclerotic calcifications. Patent ACAs. Patent left MCA. Posterior circulation: Patent. Bilateral V4 segment atherosclerotic calcifications with mild luminal narrowing. Diminutive basilar artery. Patent superior cerebellar and posterior cerebral arteries. Fetal origin of the right PCA. Mild multifocal narrowing involving the right P3 and left P2 segments. Venous sinuses: As permitted by contrast timing, patent. Anatomic variants: Please see above. Review of the MIP images confirms the above findings IMPRESSION: Right M1 segment large vessel occlusion. No large vessel  occlusion within the neck. 50% right and 30% left proximal ICA luminal narrowing. Mild-to-moderate left vertebral artery origin narrowing. Mild narrowing of the bilateral V4 segments, right P3 and left P2 segments. Electronically Signed   By: Stana Bunting M.D.   On: 03/16/2020 09:58   CT Code Stroke CTA Neck W/WO contrast  Result Date: 03/29/2020 CLINICAL DATA:  Focal neuro deficit, > 6 hrs, stroke suspected EXAM: CT ANGIOGRAPHY HEAD AND NECK TECHNIQUE: Multidetector CT imaging of the head and neck was performed using the standard protocol during bolus administration of intravenous contrast. Multiplanar CT image reconstructions and MIPs were obtained to evaluate the vascular anatomy. Carotid stenosis measurements (when applicable) are obtained utilizing NASCET criteria, using the distal internal carotid diameter as the denominator. CONTRAST:  8mL OMNIPAQUE IOHEXOL 350 MG/ML SOLN COMPARISON:  03/01/2020 head CT and prior. FINDINGS: CTA NECK FINDINGS Aortic arch: Mild aortic arch atherosclerotic calcifications. No dissection or aneurysm. Patent great vessel origins. Standard branching. Right carotid system: Patent. Carotid bifurcation calcified and noncalcified atheromatous disease with 50% proximal ICA luminal narrowing. Left carotid system: Patent. Carotid bifurcation calcified and noncalcified atheromatous disease with approximately 30% luminal narrowing. Vertebral arteries: Codominant. Patent. Mild to moderate left vertebral artery origin narrowing secondary to noncalcified atheromatous plaque. Skeleton: No acute finding.  Multilevel spondylosis. Other neck: No adenopathy.  No soft tissue mass. Upper chest: Minimal subsegmental atelectasis.  No acute finding. Review of the MIP images confirms the above findings CTA HEAD FINDINGS Anterior circulation: Right M1 segment large vessel occlusion. Patent ICAs. Bilateral carotid siphon atherosclerotic calcifications. Patent ACAs. Patent left MCA. Posterior  circulation: Patent. Bilateral V4 segment atherosclerotic calcifications with mild luminal narrowing. Diminutive basilar artery. Patent superior cerebellar and posterior cerebral arteries. Fetal origin of the right PCA. Mild multifocal narrowing involving the right P3 and left P2 segments. Venous sinuses: As permitted by contrast timing, patent. Anatomic variants: Please see above. Review of the MIP images confirms the above findings IMPRESSION: Right M1 segment large vessel occlusion. No large vessel occlusion within the neck. 50% right and 30% left proximal ICA luminal narrowing. Mild-to-moderate left vertebral artery origin narrowing. Mild narrowing of the bilateral V4 segments, right P3 and left P2 segments. Electronically Signed   By: Stana Bunting M.D.   On: 03/04/2020 09:58   CT HEAD CODE STROKE WO CONTRAST  Result Date: 03/13/2020 CLINICAL DATA:  Code stroke.  Neuro deficit, acute, stroke suspected EXAM: CT HEAD WITHOUT CONTRAST TECHNIQUE: Contiguous axial images were obtained from the base of the skull through the vertex without intravenous contrast. COMPARISON:  08/17/2019 and prior FINDINGS: Brain: No acute infarct or intracranial hemorrhage. No mass lesion. No midline shift, ventriculomegaly or extra-axial fluid collection. Mild cerebral atrophy with ex vacuo dilatation. Chronic microvascular ischemic changes. Chronic hypodensities involving the right basal ganglia, posterior limb of the right internal capsule and left insula, remote lacunar insult versus dilated perivascular spaces. Vascular: No hyperdense vessel  or unexpected calcification. Skull: No acute finding.  Prior right frontoparietal burr holes. Sinuses/Orbits: No acute orbital finding. Moderate ethmoid and left maxillary sinus disease. Other: None. ASPECTS [facility] Stroke Program Early CT Score) - Ganglionic level infarction (caudate, lentiform nuclei, internal capsule, insula, M1-M3 cortex): 7 - Supraganglionic infarction (M4-M6  cortex): 3 Total score (0-10 with 10 being normal): 10 IMPRESSION: 1. No acute intracranial process. Mild cerebral atrophy and chronic microvascular ischemic changes. 2. ASPECTS is 10 3. Remote lacunar insult versus dilated perivascular spaces involving the right basal ganglia, posterior limb of the right internal capsule and left insula. Code stroke imaging results were communicated on 03/24/20 at 9:39 am to provider Dr. Amada Jupiter via secure text paging. Electronically Signed   By: Stana Bunting M.D.   On: 03-24-2020 09:41    Procedures .Critical Care Performed by: Zadie Rhine, MD Authorized by: Zadie Rhine, MD   Critical care provider statement:    Critical care time (minutes):  35   Critical care start time:  24-Mar-2020 9:30 AM   Critical care end time:  03-24-2020 10:05 AM   Critical care time was exclusive of:  Separately billable procedures and treating other patients   Critical care was necessary to treat or prevent imminent or life-threatening deterioration of the following conditions:  CNS failure or compromise   Critical care was time spent personally by me on the following activities:  Obtaining history from patient or surrogate, review of old charts, re-evaluation of patient's condition, pulse oximetry, ordering and review of radiographic studies, ordering and review of laboratory studies, ordering and performing treatments and interventions, discussions with consultants, evaluation of patient's response to treatment and development of treatment plan with patient or surrogate   I assumed direction of critical care for this patient from another provider in my specialty: no        Medications Ordered in ED Medications  sodium chloride flush (NS) 0.9 % injection 3 mL (has no administration in time range)  alteplase (ACTIVASE) 1 mg/mL infusion 74.7 mg (has no administration in time range)    Followed by  0.9 %  sodium chloride infusion (has no administration in  time range)  iohexol (OMNIPAQUE) 350 MG/ML injection 75 mL (75 mLs Intravenous Contrast Given 2020-03-24 0932)    ED Course  I have reviewed the triage vital signs and the nursing notes.  Pertinent labs & imaging results that were available during my care of the patient were reviewed by me and considered in my medical decision making (see chart for details).    MDM Rules/Calculators/A&P                          10:05 AM Patient presents as a code stroke.  Patient was seen in conjunction with Dr. Amada Jupiter with neurology.  Patient last known well at approximately 7:55 AM.  He was arriving for breakfast when he began to vomit and appeared weak.  Initial glucose is over 100 per EMS. Stat CT head without contrast is negative for acute intracranial hemorrhage.  I personally reviewed the CT head. I spoke with his niece who is listed as contact, she lives close by. Patient is a DNR.  Dr. Amada Jupiter spoke with patient's son. Plan will be to give TPA, the patient will go to interventional radiology as he has a large right MCA occlusion. At this point patient does not require airway management, and he is a DNR and does not want to be intubated Final  Clinical Impression(s) / ED Diagnoses Final diagnoses:  Cerebrovascular accident (CVA), unspecified mechanism (HCC)    Rx / DC Orders ED Discharge Orders    None       Zadie Rhine, MD 03-31-20 1007

## 2020-03-11 NOTE — ED Triage Notes (Signed)
Pt last seen normal at 0755 when arrived for breakfast at living facility [facility]. At 0820 pt began to vomiting and went unresponsive. Upon EMS arrived pt with complete left side deficits and semi responsive.

## 2020-03-11 NOTE — ED Notes (Signed)
Report given to IR nurse.

## 2020-03-11 NOTE — Progress Notes (Signed)
  Echocardiogram 2D Echocardiogram has been performed.  Gerda Diss 03/13/2020, 4:16 PM

## 2020-03-11 NOTE — Transfer of Care (Signed)
Immediate Anesthesia Transfer of Care Note  Patient: Jeffrey Ashley  Procedure(s) Performed: IR WITH ANESTHESIA (N/A )  Patient Location: ICU  Anesthesia Type:General  Level of Consciousness: sedated and Patient remains intubated per anesthesia plan  Airway & Oxygen Therapy: Patient remains intubated per anesthesia plan and Patient placed on Ventilator (see vital sign flow sheet for setting)  Post-op Assessment: Report given to RN and Post -op Vital signs reviewed and stable  Post vital signs: Reviewed and stable  Last Vitals:  Vitals Value Taken Time  BP 154/132 2020-03-25 1334  Temp    Pulse 66 March 25, 2020 1341  Resp 25 Mar 25, 2020 1341  SpO2 100 % 2020-03-25 1341  Vitals shown include unvalidated device data.  Last Pain: There were no vitals filed for this visit.       Complications: No complications documented.

## 2020-03-11 NOTE — Sedation Documentation (Signed)
TPA completed at 1045 am and post TPA NS flush running.

## 2020-03-11 NOTE — Progress Notes (Signed)
RT NOTES: Patient transported on vent from IR to room 4N18 without incident.

## 2020-03-12 ENCOUNTER — Inpatient Hospital Stay (HOSPITAL_COMMUNITY): Payer: Medicare Other

## 2020-03-12 LAB — CBC WITH DIFFERENTIAL/PLATELET
Abs Immature Granulocytes: 0.07 10*3/uL (ref 0.00–0.07)
Basophils Absolute: 0 10*3/uL (ref 0.0–0.1)
Basophils Relative: 0 %
Eosinophils Absolute: 0 10*3/uL (ref 0.0–0.5)
Eosinophils Relative: 0 %
HCT: 35.3 % — ABNORMAL LOW (ref 39.0–52.0)
Hemoglobin: 11.6 g/dL — ABNORMAL LOW (ref 13.0–17.0)
Immature Granulocytes: 1 %
Lymphocytes Relative: 8 %
Lymphs Abs: 0.7 10*3/uL (ref 0.7–4.0)
MCH: 29.9 pg (ref 26.0–34.0)
MCHC: 32.9 g/dL (ref 30.0–36.0)
MCV: 91 fL (ref 80.0–100.0)
Monocytes Absolute: 0.2 10*3/uL (ref 0.1–1.0)
Monocytes Relative: 2 %
Neutro Abs: 8.1 10*3/uL — ABNORMAL HIGH (ref 1.7–7.7)
Neutrophils Relative %: 89 %
Platelets: 150 10*3/uL (ref 150–400)
RBC: 3.88 MIL/uL — ABNORMAL LOW (ref 4.22–5.81)
RDW: 13.8 % (ref 11.5–15.5)
WBC: 9.1 10*3/uL (ref 4.0–10.5)
nRBC: 0 % (ref 0.0–0.2)

## 2020-03-12 LAB — HEMOGLOBIN A1C
Hgb A1c MFr Bld: 5.8 % — ABNORMAL HIGH (ref 4.8–5.6)
Mean Plasma Glucose: 119.76 mg/dL

## 2020-03-12 LAB — LIPID PANEL
Cholesterol: 123 mg/dL (ref 0–200)
HDL: 34 mg/dL — ABNORMAL LOW (ref >40)
LDL Cholesterol: 71 mg/dL (ref 0–99)
Total CHOL/HDL Ratio: 3.6 RATIO
Triglycerides: 89 mg/dL (ref <150)
VLDL: 18 mg/dL (ref 0–40)

## 2020-03-12 LAB — BASIC METABOLIC PANEL
Anion gap: 11 (ref 5–15)
BUN: 24 mg/dL — ABNORMAL HIGH (ref 8–23)
CO2: 17 mmol/L — ABNORMAL LOW (ref 22–32)
Calcium: 8.2 mg/dL — ABNORMAL LOW (ref 8.9–10.3)
Chloride: 109 mmol/L (ref 98–111)
Creatinine, Ser: 1.21 mg/dL (ref 0.61–1.24)
GFR, Estimated: 57 mL/min — ABNORMAL LOW (ref >60)
Glucose, Bld: 155 mg/dL — ABNORMAL HIGH (ref 70–99)
Potassium: 3.9 mmol/L (ref 3.5–5.1)
Sodium: 137 mmol/L (ref 135–145)

## 2020-03-12 LAB — PHOSPHORUS: Phosphorus: 2.6 mg/dL (ref 2.5–4.6)

## 2020-03-12 LAB — MAGNESIUM: Magnesium: 1.6 mg/dL — ABNORMAL LOW (ref 1.7–2.4)

## 2020-03-12 MED ORDER — ASPIRIN 325 MG PO TABS: 325.0000 mg | ORAL_TABLET | Freq: Every day | ORAL | Status: DC

## 2020-03-12 MED ORDER — ASPIRIN 300 MG RE SUPP
300.0000 mg | Freq: Every day | RECTAL | Status: DC
  Administered 2020-03-12: 14:00:00 300 mg via RECTAL
  Filled 2020-03-12: qty 1

## 2020-03-12 MED ORDER — MAGNESIUM SULFATE 2 GM/50ML IV SOLN
2.0000 g | Freq: Once | INTRAVENOUS | Status: AC
  Administered 2020-03-12: 12:00:00 2 g via INTRAVENOUS
  Filled 2020-03-12: qty 50

## 2020-03-12 MED ORDER — HEPARIN SODIUM (PORCINE) 5000 UNIT/ML IJ SOLN
5000.0000 [IU] | Freq: Three times a day (TID) | INTRAMUSCULAR | Status: DC
  Administered 2020-03-12 – 2020-03-23 (×31): 5000 [IU] via SUBCUTANEOUS
  Filled 2020-03-12 (×31): qty 1

## 2020-03-12 NOTE — Progress Notes (Signed)
NAME:  Jeffrey Ashley, MRN:  [id number], DOB:  Aug 05, 1926, LOS: 1 ADMISSION DATE:  03/28/2020, CONSULTATION DATE: 03/12/2019 REFERRING MD: Neuro interventionist, CHIEF COMPLAINT: Ventilator dependent respiratory failure  Brief History   86 year old nursing home resident with acute onset left-sided weakness. Brought in as code stroke, seen by neurology, head CT negative, given IV TPA and taken to neuro IR, right common femoral approach complete revascularization of right MCA M1 occlusion  Post procedures head CT no ICH or mass-effect.  He was left intubated due to suspected aspiration.   Of note, he was DNR, after speaking to son -ID physician, intubated for procedure    Past Medical History   Past Medical History:  Diagnosis Date  . 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 Dysrhythmia    AFIB  . HOH (hard of hearing)   . Hypertension   . Hypothyroidism   . Nontraumatic subdural hematoma (HCC)   . 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 rate 16, height 6' (1.829 m), weight 83 kg, SpO2 99 %.    Vent Mode: PRVC FiO2 (%):  [30 %-50 %] 30 % Set Rate:  [16 bmp] 16 bmp Vt Set:  [500 mL-620 mL] 620 mL PEEP:  [5 cmH20] 5 cmH20 Plateau Pressure:  [15 cmH20-16 cmH20] 16 cmH20   Intake/Output Summary (Last 24 hours) at 03/12/2020 1057 Last data filed at 03/12/2020 1000 Gross per 24 hour  Intake 2381.87 ml  Output 880 ml  Net 1501.87 ml   Filed Weights   03/28/2020 0900 03/14/2020 6144  Weight: 83 kg 83 kg     Examination: Gen:      elderly man, no distress  HEENT:  EOMI, sclera anicteric, mild pallor Neck:     No JVD; no thyromegaly Lungs:    Bilateral ventilated breath sounds CV:         Regular rate and rhythm; no murmurs Abd:      + bowel sounds; soft, non-tender; no palpable masses, no distension Ext:    No edema; adequate peripheral perfusion, no DP/PT Skin:      Warm and dry; no rash Neuro: Sedated, RASS -2, decreased movement on left  Chest x-ray independently reviewed which shows ET tube high up at clavicles, no new infiltrate  Resolved Hospital Problem list     Assessment & Plan:   Postop respiratory failure secondary to code stroke and suspected aspiration. After MRI, can proceed with spontaneous breathing trials with goal extubation if mental status permits. Empiric Unasyn for aspiration, await respiratory culture ET tube can be advanced 2 cm in the interim  Right MCA CVA status post TPA and  thrombectomy  Per neurology  Hypertension -aggressive blood pressure control, use Cleviprex if needed with goal systolic blood pressure 1 20-1 40 range History of atrial fibrillation Not on anticoagulation  Concern for right lower extremity ischemia -apparently decreased pulses were noted even prior to procedure   Best practice (evaluated daily)   Diet: npo Pain/Anxiety/Delirium protocol (if indicated): As indicated VAP protocol (if indicated):  Aspirated therefore will be on Unasyn DVT prophylaxis: Compression hose GI prophylaxis: PPI Glucose control: Pain scale insulin protocol Mobility: Bedrest Son updated at bedside Code Status: Partial code no CPR no shock Disposition: Intensive care unit   Labs   CBC: Recent Labs  Lab 03/23/2020 0928 03/25/2020 0933 03/30/2020 1452 03/12/20 0054  WBC  --  6.0  --  9.1  NEUTROABS  --  3.7  --  8.1*  HGB 13.6 13.0 11.2* 11.6*  HCT 40.0 41.2 33.0* 35.3*  MCV  --  93.6  --  91.0  PLT  --  173  --  150    Basic Metabolic  Panel: Recent Labs  Lab 03/05/2020 0928 03/01/2020 0933 03/10/2020 1452 03/12/20 0054  NA 139 136 140 137  K 4.1 4.2 3.9 3.9  CL 106 105  --  109  CO2  --  22  --  17*  GLUCOSE 143* 149*  --  155*  BUN 24* 23  --  24*  CREATININE 1.10 1.26*  --  1.21  CALCIUM  --  8.7*  --  8.2*  MG  --   --   --  1.6*  PHOS  --   --   --  2.6   GFR: Estimated Creatinine Clearance: 43.6 mL/min (by C-G formula based on SCr of 1.21 mg/dL). Recent Labs  Lab 03/09/2020 0933 03/12/20 0054  WBC 6.0 9.1    Liver Function Tests: Recent Labs  Lab 03/19/2020 0933  AST 18  ALT 16  ALKPHOS 82  BILITOT 0.9  PROT 5.9*  ALBUMIN 3.4*   No results for input(s): LIPASE, AMYLASE in the last 168 hours. No results for input(s): AMMONIA in the last 168 hours.  ABG    Component Value Date/Time   PHART 7.449 03/10/2020 1452   PCO2ART 28.1 (L) 03/01/2020 1452   PO2ART 243 (H) 03/24/2020 1452   HCO3 20.2 03/13/2020 1452   TCO2 21 (L) 03/08/2020 1452   ACIDBASEDEF 4.0 (H) 03/13/2020 1452   O2SAT 100.0 03/18/2020 1452     Coagulation Profile: Recent Labs  Lab 03/19/2020 0933  INR 1.1    Cardiac Enzymes: No results for input(s): CKTOTAL, CKMB, CKMBINDEX, TROPONINI in the last 168 hours.  HbA1C: Hgb A1c MFr Bld  Date/Time Value Ref Range Status  03/12/2020 12:56 AM 5.8 (H) 4.8 - 5.6 % Final    Comment:    (NOTE) Pre diabetes:          5.7%-6.4%  Diabetes:              >6.4%  Glycemic control for   <7.0% adults with diabetes    The patient is critically ill with multiple organ systems failure and requires high complexity decision making for assessment and support, frequent evaluation and titration of therapies, application of advanced monitoring technologies and extensive interpretation of multiple databases. Critical Care Time devoted to patient care services described in this note independent of APP/resident  time is 32 minutes.   Cyril Mourning MD. Tonny Bollman. [facility] See  Amion for pager  If no response to pager , please call [phone]  After 7:00 pm call Elink  [phone]    03/12/2020, 10:57 AM

## 2020-03-12 NOTE — Progress Notes (Signed)
Referring Physician(s): CODE STROKE  Supervising Physician: Julieanne Cotton  Patient Status:  [facility] - In-pt  Chief Complaint: Right MCA occlusion s/p revascularization  Subjective: Currently intubated with propofol infusing. Warming blanket in place, family member at the bedside.   Allergies: Brimonidine tartrate and Brimonidine tartrate-timolol  Medications: Prior to Admission medications   Medication Sig Start Date End Date Taking? Authorizing Provider  aspirin 81 MG EC tablet Take 81 mg by mouth daily. 03/22/19  Yes [provider]  carvedilol (COREG) 25 MG tablet Take 1 tablet (25 mg total) by mouth 2 (two) times daily. 08/20/19 09/19/19 Yes Charise Killian, MD  Cholecalciferol (VITAMIN D-3) 5000 units TABS Take 5,000 Units by mouth daily.   Yes [provider]  finasteride (PROSCAR) 5 MG tablet Take 1 tablet (5 mg total) by mouth daily. 01/30/20  Yes Stoioff, Verna Czech, MD  latanoprost (XALATAN) 0.005 % ophthalmic solution Apply 1 drop to eye at bedtime.   Yes [provider]  levothyroxine (SYNTHROID, LEVOTHROID) 50 MCG tablet Take 50 mcg by mouth daily before breakfast. 02/11/17  Yes [provider]  Multiple Vitamins-Minerals (PRESERVISION AREDS 2 PO) Take 1 capsule by mouth 2 (two) times daily.   Yes [provider]  timolol (TIMOPTIC) 0.5 % ophthalmic solution Place 1 drop into both eyes at bedtime.   Yes [provider]  vitamin B-12 (CYANOCOBALAMIN) 500 MCG tablet Take 500 mcg by mouth See admin instructions. Take 500 mcg by mouth on Tuesday and Friday   Yes [provider]     Vital Signs: BP 131/88 (BP Location: Right Arm)   Pulse 81   Temp 98.24 F (36.8 C) (Esophageal)   Resp 16   Ht 6' (1.829 m)   Wt 182 lb 15.7 oz (83 kg)   SpO2 100%   BMI 24.82 kg/m   Physical Exam Constitutional:      General: He is not in acute distress. Eyes:     Comments: Pupils equal, round, sluggish reaction    Cardiovascular:     Comments: Right femoral groin site is clean and dry, soft to palpation. Pedal pulses verified with doppler per bedside RN.  Pulmonary:     Comments: ETT/ventilator/propofol.  Musculoskeletal:     Right lower leg: No edema.     Left lower leg: No edema.  Skin:    General: Skin is warm and dry.     Imaging: CT Code Stroke CTA Head W/WO contrast  Addendum Date: Apr 05, 2020   ADDENDUM REPORT: 05-Apr-2020 10:06 ADDENDUM: These results were called by telephone at the time of interpretation on Apr 05, 2020 at 10:00 am to provider MCNEILL [facility] , who verbally acknowledged these results. Electronically Signed   By: Stana Bunting M.D.   On: April 05, 2020 10:06   Result Date: 04-05-20 CLINICAL DATA:  Focal neuro deficit, > 6 hrs, stroke suspected EXAM: CT ANGIOGRAPHY HEAD AND NECK TECHNIQUE: Multidetector CT imaging of the head and neck was performed using the standard protocol during bolus administration of intravenous contrast. Multiplanar CT image reconstructions and MIPs were obtained to evaluate the vascular anatomy. Carotid stenosis measurements (when applicable) are obtained utilizing NASCET criteria, using the distal internal carotid diameter as the denominator. CONTRAST:  74mL OMNIPAQUE IOHEXOL 350 MG/ML SOLN COMPARISON:  04-05-20 head CT and prior. FINDINGS: CTA NECK FINDINGS Aortic arch: Mild aortic arch atherosclerotic calcifications. No dissection or aneurysm. Patent great vessel origins. Standard branching. Right carotid system: Patent. Carotid bifurcation calcified and noncalcified atheromatous disease with 50% proximal  ICA luminal narrowing. Left carotid system: Patent. Carotid bifurcation calcified and noncalcified atheromatous disease with approximately 30% luminal narrowing. Vertebral arteries: Codominant. Patent. Mild to moderate left vertebral artery origin narrowing secondary to noncalcified atheromatous plaque. Skeleton: No acute finding.  Multilevel  spondylosis. Other neck: No adenopathy.  No soft tissue mass. Upper chest: Minimal subsegmental atelectasis.  No acute finding. Review of the MIP images confirms the above findings CTA HEAD FINDINGS Anterior circulation: Right M1 segment large vessel occlusion. Patent ICAs. Bilateral carotid siphon atherosclerotic calcifications. Patent ACAs. Patent left MCA. Posterior circulation: Patent. Bilateral V4 segment atherosclerotic calcifications with mild luminal narrowing. Diminutive basilar artery. Patent superior cerebellar and posterior cerebral arteries. Fetal origin of the right PCA. Mild multifocal narrowing involving the right P3 and left P2 segments. Venous sinuses: As permitted by contrast timing, patent. Anatomic variants: Please see above. Review of the MIP images confirms the above findings IMPRESSION: Right M1 segment large vessel occlusion. No large vessel occlusion within the neck. 50% right and 30% left proximal ICA luminal narrowing. Mild-to-moderate left vertebral artery origin narrowing. Mild narrowing of the bilateral V4 segments, right P3 and left P2 segments. Electronically Signed: By: Stana Buntinghikanele  Emekauwa M.D. On: 03/02/2020 09:58   CT Code Stroke CTA Neck W/WO contrast  Addendum Date: 03/31/2020   ADDENDUM REPORT: 03/17/2020 10:06 ADDENDUM: These results were called by telephone at the time of interpretation on 03/30/2020 at 10:00 am [facility] , who verbally acknowledged these results. Electronically Signed   By: Stana Buntinghikanele  Emekauwa M.D.   On: 03/20/2020 10:06   Result Date: 03/25/2020 CLINICAL DATA:  Focal neuro deficit, > 6 hrs, stroke suspected EXAM: CT ANGIOGRAPHY HEAD AND NECK TECHNIQUE: Multidetector CT imaging of the head and neck was performed using the standard protocol during bolus administration of intravenous contrast. Multiplanar CT image reconstructions and MIPs were obtained to evaluate the vascular anatomy. Carotid stenosis measurements (when  applicable) are obtained utilizing NASCET criteria, using the distal internal carotid diameter as the denominator. CONTRAST:  75mL OMNIPAQUE IOHEXOL 350 MG/ML SOLN COMPARISON:  03/21/2020 head CT and prior. FINDINGS: CTA NECK FINDINGS Aortic arch: Mild aortic arch atherosclerotic calcifications. No dissection or aneurysm. Patent great vessel origins. Standard branching. Right carotid system: Patent. Carotid bifurcation calcified and noncalcified atheromatous disease with 50% proximal ICA luminal narrowing. Left carotid system: Patent. Carotid bifurcation calcified and noncalcified atheromatous disease with approximately 30% luminal narrowing. Vertebral arteries: Codominant. Patent. Mild to moderate left vertebral artery origin narrowing secondary to noncalcified atheromatous plaque. Skeleton: No acute finding.  Multilevel spondylosis. Other neck: No adenopathy.  No soft tissue mass. Upper chest: Minimal subsegmental atelectasis.  No acute finding. Review of the MIP images confirms the above findings CTA HEAD FINDINGS Anterior circulation: Right M1 segment large vessel occlusion. Patent ICAs. Bilateral carotid siphon atherosclerotic calcifications. Patent ACAs. Patent left MCA. Posterior circulation: Patent. Bilateral V4 segment atherosclerotic calcifications with mild luminal narrowing. Diminutive basilar artery. Patent superior cerebellar and posterior cerebral arteries. Fetal origin of the right PCA. Mild multifocal narrowing involving the right P3 and left P2 segments. Venous sinuses: As permitted by contrast timing, patent. Anatomic variants: Please see above. Review of the MIP images confirms the above findings IMPRESSION: Right M1 segment large vessel occlusion. No large vessel occlusion within the neck. 50% right and 30% left proximal ICA luminal narrowing. Mild-to-moderate left vertebral artery origin narrowing. Mild narrowing of the bilateral V4 segments, right P3 and left P2 segments. Electronically  Signed: By: Stana Buntinghikanele  Emekauwa M.D. On: 03/17/2020 09:58  DG Chest Port 1 View  Result Date: 03/12/2020 CLINICAL DATA:  Abnormal respiration. EXAM: PORTABLE CHEST 1 VIEW COMPARISON:  03/01/2020 and prior FINDINGS: Slight interval retraction of ETT with tip approximately 2.5 cm above the level of the clavicles. Grossly unchanged esophageal temperature probe. No pneumothorax or pleural effusion. Increased patchy right basilar opacities. Cardiomegaly. IMPRESSION: Increased bibasilar opacities. Slight interval retraction of ETT. Electronically Signed   By: Stana Bunting M.D.   On: 03/12/2020 06:43   DG Chest Port 1 View  Result Date: 03/27/2020 CLINICAL DATA:  86 year old male with stroke. EXAM: PORTABLE CHEST 1 VIEW COMPARISON:  08/17/2019 FINDINGS: Unchanged cardiomegaly. Interval insertion of endotracheal tube with the tip in the midthoracic trachea. Esophageal temperature probe is positioned in the upper third of the thorax. Bibasilar subsegmental atelectasis. No significant pleural effusion or pneumothorax. No acute osseous abnormality. IMPRESSION: 1. Endotracheal tube distal tip in the midthoracic trachea. 2. Bibasilar subsegmental atelectasis. Electronically Signed   By: Marliss Coots MD   On: 03/01/2020 14:13   DG Abd Portable 1V  Result Date: 03/10/2020 CLINICAL DATA:  OG tube placement. EXAM: PORTABLE ABDOMEN - 1 VIEW COMPARISON:  Chest radiograph earlier today. FINDINGS: Enteric tube is in place with tip in the region of the distal esophagus, side-port in the mid thorax. Recommend advancement of greater than 10 cm for optimal placement. Nonobstructive bowel gas pattern. There is excreted IV contrast in the renal collecting systems. IMPRESSION: Enteric tube with tip in the distal esophagus, side-port in the mid esophagus. Recommend advancement of greater than 10 cm for optimal placement. Electronically Signed   By: Narda Rutherford M.D.   On: 03/12/2020 22:48   ECHOCARDIOGRAM  COMPLETE  Result Date: 03/13/2020    ECHOCARDIOGRAM REPORT   Patient Name:   Jeffrey Ashley Date of Exam: 03/05/2020 Medical Rec #:  [id number]         Height:       72.0 in Accession #:    [id number]        Weight:       183.0 lb Date of Birth:  10-Feb-1927         BSA:          2.052 m Patient Age:    86 years          BP:           134/69 mmHg Patient Gender: M                 HR:           76 bpm. Exam Location:  Inpatient Procedure: 2D Echo, Cardiac Doppler and Color Doppler Indications:    Stroke  History:        Patient has no prior history of Echocardiogram examinations.                 Arrythmias:Atrial Fibrillation; Risk Factors:Hypertension.  Sonographer:    Ross Ludwig RDCS (AE) Referring Phys: [phone] MCNEILL P [facility]  Sonographer Comments: Echo performed with patient supine and on artificial respirator. IMPRESSIONS  1. Left ventricular ejection fraction, by estimation, is 50 to 55%. The left ventricle has low normal function. The left ventricle has no regional wall motion abnormalities. There is severe concentric left ventricular hypertrophy. Left ventricular diastolic parameters are indeterminate.  2. Right ventricular systolic function is normal. The right ventricular size is normal.  3. Left atrial size was severely dilated.  4. Right atrial size was severely dilated.  5. A small pericardial effusion  is present. The pericardial effusion is surrounding the apex.  6. The mitral valve is grossly normal. Trivial mitral valve regurgitation.  7. The aortic valve is tricuspid. There is mild calcification of the aortic valve. Aortic valve regurgitation is mild. No aortic stenosis is present.  8. The inferior vena cava is normal in size with greater than 50% respiratory variability, suggesting right atrial pressure of 3 mmHg. Comparison(s): No prior Echocardiogram. FINDINGS  Left Ventricle: Left ventricular ejection fraction, by estimation, is 50 to 55%. The left ventricle has low normal function. The  left ventricle has no regional wall motion abnormalities. The left ventricular internal cavity size was normal in size. There is severe concentric left ventricular hypertrophy. Left ventricular diastolic parameters are indeterminate. Right Ventricle: The right ventricular size is normal. No increase in right ventricular wall thickness. Right ventricular systolic function is normal. Left Atrium: Left atrial size was severely dilated. Right Atrium: Right atrial size was severely dilated. Pericardium: A small pericardial effusion is present. The pericardial effusion is surrounding the apex. Mitral Valve: The mitral valve is grossly normal. Trivial mitral valve regurgitation. Tricuspid Valve: The tricuspid valve is normal in structure. Tricuspid valve regurgitation is mild. Aortic Valve: The aortic valve is tricuspid. There is mild calcification of the aortic valve. Aortic valve regurgitation is mild. Aortic regurgitation PHT measures 806 msec. No aortic stenosis is present. Aortic valve mean gradient measures 1.7 mmHg. Aortic valve peak gradient measures 2.9 mmHg. Aortic valve area, by VTI measures 2.01 cm. Pulmonic Valve: The pulmonic valve was normal in structure. Pulmonic valve regurgitation is trivial. Aorta: The aortic root and ascending aorta are structurally normal, with no evidence of dilitation. Venous: The inferior vena cava is normal in size with greater than 50% respiratory variability, suggesting right atrial pressure of 3 mmHg. IAS/Shunts: The atrial septum is grossly normal.  LEFT VENTRICLE PLAX 2D LVIDd:         4.20 cm LVIDs:         3.00 cm LV PW:         2.10 cm LV IVS:        1.70 cm LVOT diam:     1.90 cm LV SV:         37 LV SV Index:   18 LVOT Area:     2.84 cm  RIGHT VENTRICLE RV Basal diam:  3.40 cm RV S prime:     9.02 cm/s TAPSE (M-mode): 1.8 cm LEFT ATRIUM              Index       RIGHT ATRIUM           Index LA diam:        5.00 cm  2.44 cm/m  RA Area:     27.20 cm LA Vol (A2C):   125.0  ml 60.93 ml/m RA Volume:   81.20 ml  39.58 ml/m LA Vol (A4C):   101.0 ml 49.23 ml/m LA Biplane Vol: 114.0 ml 55.57 ml/m  AORTIC VALVE AV Area (Vmax):    2.01 cm AV Area (Vmean):   1.82 cm AV Area (VTI):     2.01 cm AV Vmax:           84.47 cm/s AV Vmean:          61.033 cm/s AV VTI:            0.185 m AV Peak Grad:      2.9 mmHg AV Mean Grad:  1.7 mmHg LVOT Vmax:         59.87 cm/s LVOT Vmean:        39.133 cm/s LVOT VTI:          0.131 m LVOT/AV VTI ratio: 0.71 AI PHT:            806 msec  AORTA Ao Root diam: 3.60 cm Ao Asc diam:  3.50 cm TRICUSPID VALVE TR Peak grad:   25.4 mmHg TR Vmax:        252.00 cm/s  SHUNTS Systemic VTI:  0.13 m Systemic Diam: 1.90 cm Riley Lam MD Electronically signed by Riley Lam MD Signature Date/Time: 2020-03-29/4:41:09 PM    Final    CT HEAD CODE STROKE WO CONTRAST  Result Date: 29-Mar-2020 CLINICAL DATA:  Code stroke.  Neuro deficit, acute, stroke suspected EXAM: CT HEAD WITHOUT CONTRAST TECHNIQUE: Contiguous axial images were obtained from the base of the skull through the vertex without intravenous contrast. COMPARISON:  08/17/2019 and prior FINDINGS: Brain: No acute infarct or intracranial hemorrhage. No mass lesion. No midline shift, ventriculomegaly or extra-axial fluid collection. Mild cerebral atrophy with ex vacuo dilatation. Chronic microvascular ischemic changes. Chronic hypodensities involving the right basal ganglia, posterior limb of the right internal capsule and left insula, remote lacunar insult versus dilated perivascular spaces. Vascular: No hyperdense vessel or unexpected calcification. Skull: No acute finding.  Prior right frontoparietal burr holes. Sinuses/Orbits: No acute orbital finding. Moderate ethmoid and left maxillary sinus disease. Other: None. ASPECTS [facility] Stroke Program Early CT Score) - Ganglionic level infarction (caudate, lentiform nuclei, internal capsule, insula, M1-M3 cortex): 7 - Supraganglionic infarction  (M4-M6 cortex): 3 Total score (0-10 with 10 being normal): 10 IMPRESSION: 1. No acute intracranial process. Mild cerebral atrophy and chronic microvascular ischemic changes. 2. ASPECTS is 10 3. Remote lacunar insult versus dilated perivascular spaces involving the right basal ganglia, posterior limb of the right internal capsule and left insula. Code stroke imaging results were communicated on 2020/03/29 at 9:39 am to provider Dr. Amada Jupiter via secure text paging. Electronically Signed   By: Stana Bunting M.D.   On: 03-29-20 09:41    Labs:  CBC: Recent Labs    08/19/19 0730 08/20/19 0420 March 29, 2020 0928 2020-03-29 0933 29-Mar-2020 1452 03/12/20 0054  WBC 9.5 9.7  --  6.0  --  9.1  HGB 13.6 13.0 13.6 13.0 11.2* 11.6*  HCT 41.0 38.1* 40.0 41.2 33.0* 35.3*  PLT 144* 144*  --  173  --  150    COAGS: Recent Labs    March 29, 2020 0933  INR 1.1  APTT 30    BMP: Recent Labs    08/18/19 0527 08/18/19 1727 08/19/19 0730 08/20/19 0420 01/21/20 0933 03-29-2020 0928 03-29-20 0933 2020/03/29 1452 03/12/20 0054  NA 137  --  136 136  --  139 136 140 137  K 4.5  --  4.3 4.3  --  4.1 4.2 3.9 3.9  CL 104  --  104 105  --  106 105  --  109  CO2 25  --  25 25  --   --  22  --  17*  GLUCOSE 147*  --  127* 126*  --  143* 149*  --  155*  BUN 24*  --  26* 28*  --  24* 23  --  24*  CALCIUM 8.9  --  8.3* 8.5*  --   --  8.7*  --  8.2*  CREATININE 0.74 0.84 0.76 0.78 1.00 1.10 1.26*  --  1.21  GFRNONAA >60 >60 >60 >60  --   --  54*  --  57*  GFRAA >60 >60 >60 >60  --   --   --   --   --     LIVER FUNCTION TESTS: Recent Labs    03-23-20 0933  BILITOT 0.9  AST 18  ALT 16  ALKPHOS 82  PROT 5.9*  ALBUMIN 3.4*    Assessment and Plan:  Right MCA occlusion s/p revascularization 03/23/2020: MRA obtained this morning, results pending. Per bedside RN and family member at the bedside, patient is moving his right arm and leg, minimal movement on the left. Right groin vascular access site is clean,  dry and soft to palpation.  Per critical care note, spontaneous breathing trials will be conducted after MRA with a goal of extubation.  IR will continue to follow.   Electronically Signed: Alwyn Ren, AGACNP-BC [phone] 03/12/2020, 8:38 AM   I spent a total of 15 Minutes at the the patient's bedside AND on the patient's hospital floor or unit, greater than 50% of which was counseling/coordinating care for right MCA revascularization

## 2020-03-12 NOTE — Progress Notes (Signed)
PT Cancellation Note  Patient Details Name: Jeffrey Ashley MRN: [id number] DOB: [birth year]   Cancelled Treatment:    Reason Eval/Treat Not Completed: Active bedrest order Pt on active bedrest. Will await increase in activity orders prior to PT evaluation. Will follow.   [name] A Tyrease Vandeberg 03/12/2020, 6:56 AM [place], PT, DPT Acute Rehabilitation Services Pager [phone] Office [phone]

## 2020-03-12 NOTE — Progress Notes (Signed)
MRI reviewed, and pt has large right MCA infarcts more on the temporal and parietal lobes, superior frontal lobe seems spared. However, right BG is involved. No significant hemorrhagic conversion. Slight mass effect. Will start ASA and heparin subq for DVT prophylaxis. Continue to wean off sedation if able.  I reviewed neuro images with pt son and discussed with him about potential poor prognosis. He will discuss with family but at this time will continue current treatment plan.   Marvel Plan, MD PhD Stroke Neurology 03/12/2020 7:36 PM

## 2020-03-12 NOTE — Progress Notes (Signed)
OT Cancellation Note  Patient Details Name: Jeffrey Ashley MRN: [id number] DOB: November 20, 1926   Cancelled Treatment:    Reason Eval/Treat Not Completed: Active bedrest order.  Will reattempt.  [name], OTR/L Acute Rehabilitation Services Pager [phone] Office [phone]   [name] M 03/12/2020, 8:17 AM

## 2020-03-12 NOTE — Progress Notes (Signed)
ETT advanced 3 cm from 22 at lip to 25 per MD order.

## 2020-03-12 NOTE — Progress Notes (Signed)
Patient transported to MRI and back to 4N18 without complication.

## 2020-03-12 NOTE — Progress Notes (Signed)
STROKE TEAM PROGRESS NOTE   INTERVAL HISTORY His son is at the bedside.  Pt still intubated on sedation, not open eyes on stimulation, but with pain, he moves right UE and mild withdraw of right LE, but no movement of LUE and slight toe movement at LLE. Pending MRI and MRA.   Pt son, pt follows with Jeffrey Ashley at [facility] in [place]. He was not on [facility] due to advanced age, hx of spontaneous SDH, and gross hematuria x 2.   OBJECTIVE Vitals:   03/12/20 0000 03/12/20 0100 03/12/20 0200 03/12/20 0357  BP: 123/88 117/83 113/82 131/67  Pulse: 82 84 83 78  Resp: 16 19 13 16   Temp: 99.68 F (37.6 C) 100.04 F (37.8 C) 98.24 F (36.8 C)   TempSrc:      SpO2: 99% 99% 99% 99%  Weight:      Height:        CBC:  Recent Labs  Lab 03/20/2020 0933 03/03/2020 1452 03/12/20 0054  WBC 6.0  --  9.1  NEUTROABS 3.7  --  8.1*  HGB 13.0 11.2* 11.6*  HCT 41.2 33.0* 35.3*  MCV 93.6  --  91.0  PLT 173  --  150    Basic Metabolic Panel:  Recent Labs  Lab 03/25/2020 0933 03/06/2020 1452 03/12/20 0054  NA 136 140 137  K 4.2 3.9 3.9  CL 105  --  109  CO2 22  --  17*  GLUCOSE 149*  --  155*  BUN 23  --  24*  CREATININE 1.26*  --  1.21  CALCIUM 8.7*  --  8.2*  MG  --   --  1.6*  PHOS  --   --  2.6    Lipid Panel:     Component Value Date/Time   CHOL 123 03/12/2020 0054   TRIG 89 03/12/2020 0054   HDL 34 (L) 03/12/2020 0054   CHOLHDL 3.6 03/12/2020 0054   VLDL 18 03/12/2020 0054   LDLCALC 71 03/12/2020 0054   HgbA1c:  Lab Results  Component Value Date   HGBA1C 5.8 (H) 03/12/2020   Urine Drug Screen:     Component Value Date/Time   LABOPIA NEGATIVE 03/13/2012 1900   COCAINSCRNUR NEGATIVE 03/13/2012 1900   LABBENZ NEGATIVE 03/13/2012 1900   AMPHETMU NEGATIVE 03/13/2012 1900   THCU NEGATIVE 03/13/2012 1900   LABBARB NEGATIVE 03/13/2012 1900    Alcohol Level No results found for: ETH  IMAGING  CT Code Stroke CTA Head W/WO contrast CT Code Stroke CTA Neck W/WO  contrast 03/08/2020   IMPRESSION:  Right M1 segment large vessel occlusion. No large vessel occlusion within the neck. 50% right and 30% left proximal ICA luminal narrowing. Mild-to-moderate left vertebral artery origin narrowing. Mild narrowing of the bilateral V4 segments, right P3 and left P2 segments.   CT HEAD CODE STROKE WO CONTRAST 03/28/2020 IMPRESSION:  1. No acute intracranial process. Mild cerebral atrophy and chronic microvascular ischemic changes.  2. ASPECTS is 10  3. Remote lacunar insult versus dilated perivascular spaces involving the right basal ganglia, posterior limb of the right internal capsule and left insula.   MRI BRAIN - pending  MRA HEAD - pending  CT HEAD WO CONTRAST - pending  DG Chest Port 1 View 03/22/2020 IMPRESSION:  1. Endotracheal tube distal tip in the midthoracic trachea.  2. Bibasilar subsegmental atelectasis.   DG Abd Portable 1V 03/15/2020 IMPRESSION:  Enteric tube with tip in the distal esophagus, side-port in the mid esophagus. Recommend  advancement of greater than 10 cm for optimal placement.   ECHOCARDIOGRAM COMPLETE 04/04/2020 IMPRESSIONS   1. Left ventricular ejection fraction, by estimation, is 50 to 55%. The left ventricle has low normal function. The left ventricle has no regional wall motion abnormalities. There is severe concentric left ventricular hypertrophy. Left ventricular diastolic parameters are indeterminate.   2. Right ventricular systolic function is normal. The right ventricular size is normal.   3. Left atrial size was severely dilated.   4. Right atrial size was severely dilated.   5. A small pericardial effusion is present. The pericardial effusion is surrounding the apex.   6. The mitral valve is grossly normal. Trivial mitral valve regurgitation.   7. The aortic valve is tricuspid. There is mild calcification of the aortic valve. Aortic valve regurgitation is mild. No aortic stenosis is present.   8. The  inferior vena cava is normal in size with greater than 50% respiratory variability, suggesting right atrial pressure of 3 mmHg. Comparison(s): No prior Echocardiogram.   ECG - atrial fibrillation - ventricular response 81 BPM (See cardiology reading for complete details)   PHYSICAL EXAM  Temp:  [92.66 F (33.7 C)-100.04 F (37.8 C)] 98.24 F (36.8 C) (12/12 0800) Pulse Rate:  [66-86] 81 (12/12 0800) Resp:  [13-20] 16 (12/12 0800) BP: (79-182)/(41-162) 131/88 (12/12 0800) SpO2:  [97 %-100 %] 100 % (12/12 0800) Arterial Line BP: (99-157)/(50-82) 132/62 (12/12 0800) FiO2 (%):  [30 %-50 %] 30 % (12/12 0734) Weight:  [83 kg] 83 kg (12/11 0922)  General - Well nourished, well developed, intubated on sedation.  Ophthalmologic - fundi not visualized due to noncooperation.  Cardiovascular - irregularly irregular heart rate and rhythm.  Neuro - intubated on sedation, eyes closed, not following commands. With forced eye opening, eyes in mid position, spontaneous eye slow rolling bilaterally but not complete, not blinking to visual threat, not tracking, pupils 48mm bilaterally but very sluggish to light. Corneal reflex weakly present bilaterally, gag and cough present. Breathing over the vent.  Facial symmetry not able to test due to ET tube.  Tongue protrusion not cooperative. On pain stimulation, RUE against gravity, RLE mild withdraw, LUE flaccid, LLE slight toe movement. DTR 1+ and no babinski. Sensation, coordination and gait not tested.   ASSESSMENT/PLAN Jeffrey Ashley is a 86 y.o. male with history of hypertension, hypothyroidism, hearing impaired, subdural hematoma in 2013 and atrial fibrillation not on anticoagulation who presents with dysarthria and left-sided weakness. The pt vomited en route and there was concern for possible aspiration.  The patient received IV t-PA Saturday Apr 04, 2020 at 9:45 AM. IR 2020-04-04 at 11:40 AM - S/P complete revascularization of RT MCA M1 occlusion  achieving a TICI 3 revascularization.  Stroke: Rt MCA infarct - embolic - likely from atrial fibrillation without anticoagulation  CT Head -  No acute intracranial process. Remote lacunar insult versus dilated perivascular spaces involving the right basal ganglia, posterior limb of the right internal capsule and left insula.   CTA H&N - Right M1 segment large vessel occlusion. No large vessel occlusion within the neck. 50% right and 30% left proximal ICA luminal narrowing. Mild-to-moderate left vertebral artery origin narrowing. Mild narrowing of the bilateral V4 segments, right P3 and left P2 segments.  MRI head - pending  MRA head - pending  2D Echo - EF 50 - 55%. No cardiac source of emboli identified.   Sars Corona Virus 2 - negative  LDL - 71  HgbA1c - 5.8  VTE  prophylaxis - SCDs  aspirin 81 mg daily prior to admission, now on No antithrombotic within 24h s/p tPA  Patient will be counseled to be compliant with his antithrombotic medications  Ongoing aggressive stroke risk factor management  Therapy recommendations:  pending  Disposition:  Pending  Afib  Chronic afib but not sure about afib burden   Was on coumadin in the past, off after SDH  Never restarted due to advanced age, spontaneous SDH, gross hematuria  Follow with Dr. Marge Duncans at [place] [facility]  Currently rate controlled  Respiratory failure  Intubated on vent  CCM on board  Not a candidate to extubate today given mental status  Wean off sedation as able  Aspiration pneumonia  Vomited x2 before intubation  Temperature - T Max - 100.4  IV Unasyn started 15-Mar-2020  WBC's - 6.0->9.1  Respiratory cultures - pending  CCM on board  Hypertension  Home BP meds: Coreg  Current BP meds: Cleviprex prn  Stable . BP goal post tPA < 180/105  . Long-term BP goal normotensive  Hyperlipidemia  Home Lipid lowering medication: none   LDL 71, goal < 70  Will decide on statin after GOC  discussion  Dysphagia  N.p.o.  We will consider tube feeding tomorrow  Speech to follow  Other Stroke Risk Factors  Advanced age  Previous ETOH use  Hx of R SDH 2013 s/p craniotomy  Possible remote lacunar by imaging  Other Active Problems, Findings and Recommendations  Code status - Limited code CKD - stage 3a - creatinine - 1.26->1.21  Hospital day # 1  This patient is critically ill due to right MCA stroke, right M1 occlusion, status post TPA and thrombectomy, respiratory failure, aspiration pneumonia, dysphagia and at significant risk of neurological worsening, death form cerebral edema, recurrent stroke, hemorrhagic conversion, hemorrhage from TPA, septic shock, sepsis, seizure. This patient's care requires constant monitoring of vital signs, hemodynamics, respiratory and cardiac monitoring, review of multiple databases, neurological assessment, discussion with family, other specialists and medical decision making of high complexity. I spent 50 minutes of neurocritical care time in the care of this patient. I had long discussion with son at bedside, updated pt current condition, treatment plan and potential prognosis, and answered all the questions.  He expressed understanding and appreciation.    Marvel Plan, MD PhD Stroke Neurology 03/12/2020 7:26 PM   To contact Stroke Continuity provider, please refer to [email or web address]. After hours, contact General Neurology

## 2020-03-13 ENCOUNTER — Encounter (HOSPITAL_COMMUNITY): Payer: Self-pay | Admitting: Radiology

## 2020-03-13 ENCOUNTER — Inpatient Hospital Stay (HOSPITAL_COMMUNITY): Payer: Medicare Other

## 2020-03-13 DIAGNOSIS — J690 Pneumonitis due to inhalation of food and vomit: Secondary | ICD-10-CM

## 2020-03-13 DIAGNOSIS — I6601 Occlusion and stenosis of right middle cerebral artery: Secondary | ICD-10-CM

## 2020-03-13 DIAGNOSIS — I63411 Cerebral infarction due to embolism of right middle cerebral artery: Principal | ICD-10-CM

## 2020-03-13 LAB — CBC
HCT: 35 % — ABNORMAL LOW (ref 39.0–52.0)
HCT: 34.9 % — ABNORMAL LOW (ref 39.0–52.0)
Hemoglobin: 11.4 g/dL — ABNORMAL LOW (ref 13.0–17.0)
Hemoglobin: 11.1 g/dL — ABNORMAL LOW (ref 13.0–17.0)
MCH: 30 pg (ref 26.0–34.0)
MCH: 29.8 pg (ref 26.0–34.0)
MCHC: 32.6 g/dL (ref 30.0–36.0)
MCHC: 31.8 g/dL (ref 30.0–36.0)
MCV: 92.1 fL (ref 80.0–100.0)
MCV: 93.6 fL (ref 80.0–100.0)
Platelets: 146 10*3/uL — ABNORMAL LOW (ref 150–400)
Platelets: DECREASED 10*3/uL (ref 150–400)
RBC: 3.8 MIL/uL — ABNORMAL LOW (ref 4.22–5.81)
RBC: 3.73 MIL/uL — ABNORMAL LOW (ref 4.22–5.81)
RDW: 14.6 % (ref 11.5–15.5)
RDW: 14.5 % (ref 11.5–15.5)
WBC: 10.3 10*3/uL (ref 4.0–10.5)
WBC: 9.3 10*3/uL (ref 4.0–10.5)
nRBC: 0 % (ref 0.0–0.2)
nRBC: 0 % (ref 0.0–0.2)

## 2020-03-13 LAB — BASIC METABOLIC PANEL
Anion gap: 11 (ref 5–15)
BUN: 24 mg/dL — ABNORMAL HIGH (ref 8–23)
CO2: 19 mmol/L — ABNORMAL LOW (ref 22–32)
Calcium: 8.3 mg/dL — ABNORMAL LOW (ref 8.9–10.3)
Chloride: 112 mmol/L — ABNORMAL HIGH (ref 98–111)
Creatinine, Ser: 1.25 mg/dL — ABNORMAL HIGH (ref 0.61–1.24)
GFR, Estimated: 54 mL/min — ABNORMAL LOW (ref >60)
Glucose, Bld: 120 mg/dL — ABNORMAL HIGH (ref 70–99)
Potassium: 3.5 mmol/L (ref 3.5–5.1)
Sodium: 142 mmol/L (ref 135–145)

## 2020-03-13 LAB — MAGNESIUM
Magnesium: 2.4 mg/dL (ref 1.7–2.4)
Magnesium: 2.2 mg/dL (ref 1.7–2.4)

## 2020-03-13 LAB — GLUCOSE, CAPILLARY
Glucose-Capillary: 92 mg/dL (ref 70–99)
Glucose-Capillary: 105 mg/dL — ABNORMAL HIGH (ref 70–99)
Glucose-Capillary: 106 mg/dL — ABNORMAL HIGH (ref 70–99)
Glucose-Capillary: 107 mg/dL — ABNORMAL HIGH (ref 70–99)

## 2020-03-13 LAB — CULTURE, RESPIRATORY W GRAM STAIN: Culture: NORMAL

## 2020-03-13 LAB — PHOSPHORUS
Phosphorus: 3.1 mg/dL (ref 2.5–4.6)
Phosphorus: 3.2 mg/dL (ref 2.5–4.6)

## 2020-03-13 LAB — TRIGLYCERIDES: Triglycerides: 136 mg/dL (ref <150)

## 2020-03-13 MED ORDER — VITAL HIGH PROTEIN PO LIQD: 1000.0000 mL | ORAL | Status: DC

## 2020-03-13 MED ORDER — PROSOURCE TF PO LIQD
90.0000 mL | Freq: Two times a day (BID) | ORAL | Status: DC
  Administered 2020-03-13 – 2020-03-15 (×3): 90 mL
  Filled 2020-03-13 (×3): qty 90

## 2020-03-13 MED ORDER — OSMOLITE 1.5 CAL PO LIQD
1000.0000 mL | ORAL | Status: DC
  Administered 2020-03-13: 1000 mL

## 2020-03-13 MED ORDER — PANTOPRAZOLE SODIUM 40 MG PO PACK: 40.0000 mg | PACK | Freq: Every day | ORAL | Status: DC

## 2020-03-13 MED ORDER — LEVOTHYROXINE SODIUM 50 MCG PO TABS
50.0000 ug | ORAL_TABLET | Freq: Every day | ORAL | Status: DC
  Administered 2020-03-14 – 2020-03-23 (×8): 50 ug
  Filled 2020-03-13 (×9): qty 1

## 2020-03-13 MED ORDER — PROSOURCE TF PO LIQD
45.0000 mL | Freq: Two times a day (BID) | ORAL | Status: DC
  Administered 2020-03-13: 14:00:00 45 mL
  Filled 2020-03-13: qty 45

## 2020-03-13 MED ORDER — LABETALOL HCL 5 MG/ML IV SOLN
10.0000 mg | INTRAVENOUS | Status: DC | PRN
  Administered 2020-03-15: 07:00:00 10 mg via INTRAVENOUS
  Filled 2020-03-13: qty 4

## 2020-03-13 MED ORDER — ASPIRIN 325 MG PO TABS
325.0000 mg | ORAL_TABLET | Freq: Every day | ORAL | Status: DC
  Administered 2020-03-13 – 2020-03-23 (×10): 325 mg
  Filled 2020-03-13 (×10): qty 1

## 2020-03-13 MED ORDER — CARVEDILOL 12.5 MG PO TABS
12.5000 mg | ORAL_TABLET | Freq: Two times a day (BID) | ORAL | Status: DC
  Administered 2020-03-13 – 2020-03-14 (×2): 12.5 mg
  Filled 2020-03-13 (×2): qty 1

## 2020-03-13 MED ORDER — PANTOPRAZOLE SODIUM 40 MG PO PACK
40.0000 mg | PACK | Freq: Every day | ORAL | Status: DC
  Administered 2020-03-13 – 2020-03-23 (×10): 40 mg
  Filled 2020-03-13 (×10): qty 20

## 2020-03-13 MED ORDER — VITAMIN D 25 MCG (1000 UNIT) PO TABS
5000.0000 [IU] | ORAL_TABLET | Freq: Every day | ORAL | Status: DC
  Administered 2020-03-13 – 2020-03-23 (×10): 5000 [IU]
  Filled 2020-03-13 (×11): qty 5

## 2020-03-13 MED ORDER — CLEVIDIPINE BUTYRATE 0.5 MG/ML IV EMUL: 0.0000 mg/h | INTRAVENOUS | Status: DC

## 2020-03-13 MED ORDER — LATANOPROST 0.005 % OP SOLN
1.0000 [drp] | Freq: Every day | OPHTHALMIC | Status: DC
  Administered 2020-03-13 – 2020-03-24 (×11): 1 [drp] via OPHTHALMIC
  Filled 2020-03-13 (×2): qty 2.5

## 2020-03-13 MED ORDER — FINASTERIDE 5 MG PO TABS: 5.0000 mg | ORAL_TABLET | Freq: Every day | ORAL | Status: DC

## 2020-03-13 MED ORDER — CARVEDILOL 12.5 MG PO TABS: 12.5000 mg | ORAL_TABLET | Freq: Two times a day (BID) | ORAL | Status: DC

## 2020-03-13 NOTE — Progress Notes (Signed)
SLP Cancellation Note  Patient Details Name: Jeffrey Ashley MRN: [id number] DOB: Jul 30, 1926   Cancelled treatment:        On vent. Will follow to initiate speech-language-cognitive eval when able.    [name] 03/13/2020, 8:52 AM  [name] Nurse, children's [phone] Office [phone]

## 2020-03-13 NOTE — Progress Notes (Signed)
PT Cancellation Note  Patient Details Name: Jeffrey Ashley MRN: [id number] DOB: 1926-11-10   Cancelled Treatment:    Reason Eval/Treat Not Completed: Other (comment). Pt with active bedrest order in AM. Upon return in PM the pt was found to have new onset hematuria with large blood clots. PT will continue to hold at this time and evaluate when appropriate.   Rolm Baptise, PT, DPT   Acute Rehabilitation Department Pager #: [phone]   Gaetana Michaelis 03/13/2020, 6:13 PM

## 2020-03-13 NOTE — Progress Notes (Addendum)
Initial Nutrition Assessment  DOCUMENTATION CODES:   Severe malnutrition in context of chronic illness  INTERVENTION:   Initiate tube feeding: Osmolite 1.5 at 20 ml/h increase by 10 ml every 8 hours to goal rate of 50 ml/h (1200 ml per day) Prosource TF 90 ml BID  Provides 1960 kcal, 125 gm protein, 912 ml free water daily  Monitor magnesium and phosphorus every 12 hours x 4 occurances, MD to replete as needed, as pt is at risk for refeeding syndrome given severe malnutrition.   NUTRITION DIAGNOSIS:   Severe Malnutrition related to chronic illness as evidenced by severe fat depletion,severe muscle depletion.  GOAL:   Patient will meet greater than or equal to 90% of their needs  MONITOR:   Vent status,TF tolerance,I & O's  REASON FOR ASSESSMENT:   Consult,Ventilator Enteral/tube feeding initiation and management  ASSESSMENT:   Pt with PMH of HTN, hypothyroidism, and is HOH admitted with large R MCA stroke s/p tPA and thrombectomy.  Pt discussed during ICU rounds and with RN.  No family present. Per RN pt did not tolerate cortrak placement but is more awake and may extubate.  Pt does have an OG tube for TF if he remains intubated.   Patient is currently intubated on ventilator support MV: 10.3 L/min Temp (24hrs), Avg:97.8 F (36.6 C), Min:96.98 F (36.1 C), Max:98.6 F (37 C)  Medications reviewed and include: vitamin D3, colace  Labs reviewed    NUTRITION - FOCUSED PHYSICAL EXAM:  Flowsheet Row Most Recent Value  Orbital Region Severe depletion  Upper Arm Region Moderate depletion  Thoracic and Lumbar Region Severe depletion  Buccal Region Unable to assess  Temple Region Severe depletion  Clavicle Bone Region Severe depletion  Clavicle and Acromion Bone Region Severe depletion  Scapular Bone Region Unable to assess  Dorsal Hand No depletion  Patellar Region Severe depletion  Anterior Thigh Region Severe depletion  Posterior Calf Region Moderate  depletion  Edema (RD Assessment) Moderate  [BUE (hands)]  Hair Reviewed  Eyes Reviewed  Mouth Unable to assess  Skin Reviewed  Nails Reviewed       Diet Order:   Diet Order            Diet NPO time specified  Diet effective now                 EDUCATION NEEDS:   No education needs have been identified at this time  Skin:  Skin Assessment: Reviewed RN Assessment  Last BM:  unknown  Height:   Ht Readings from Last 1 Encounters:  03/28/2020 6' (1.829 m)    Weight:   Wt Readings from Last 1 Encounters:  2020/03/28 83 kg    Ideal Body Weight:  80.9 kg  BMI:  Body mass index is 24.82 kg/m.  Estimated Nutritional Needs:   Kcal:  2000  Protein:  120-135 grams  Fluid:  2 L/day  Cammy Copa., RD, LDN, CNSC See AMiON for contact information

## 2020-03-13 NOTE — Progress Notes (Signed)
OT Cancellation Note  Patient Details Name: Jeffrey Ashley MRN: [id number] DOB: September 13, 1926   Cancelled Treatment:    Reason Eval/Treat Not Completed: Patient not medically ready;Active bedrest order. OT order received and appreciated however this conflicts with current bedrest order set. Please increase activity tolerance as appropriate and remove bedrest from orders. . Please contact OT at [phone] if bed rest order is discontinued. OT will hold evaluation at this time and will check back as time allows pending increased activity orders.   [name], OTR/L  Acute Rehabilitation Services Pager: [phone] Office: [phone] .  03/13/2020, 7:31 AM

## 2020-03-13 NOTE — Progress Notes (Signed)
Referring Physician(s): Code stroke- Rejeana Brock (neurology)  Supervising Physician: Julieanne Cotton  Patient Status:  [facility] - In-pt  Chief Complaint: None- intubated without sedation  Subjective:  History of acute CVA s/p cerebral arteriogram with emergent mechanical thrombectomy of right MCA M1 occlusion achieving a TICI 3 revascularization via right femoral approach 03/29/20 by Dr. Corliss Skains. Patient laying in bed intubated without sedation. He opens eyes to voice and follows simple commands. Son at bedside. Can spontaneously move right side, LLE withdraws from pain, no spontaneous movements of LUE. Right femoral puncture site c/d/i.  MR/MRA brain/head 03/12/2020: 1. Sequela of acute/subacute right MCA territory infarct. 2. Small acute right cerebellar insult. 3. Interval revascularization of the right MCA. No high-grade intracranial narrowing or large vessel occlusion. 4. Mild narrowing of the right V4, right P2 and left M2 segments.   Allergies: Brimonidine tartrate and Brimonidine tartrate-timolol  Medications: Prior to Admission medications   Medication Sig Start Date End Date Taking? Authorizing Provider  aspirin 81 MG EC tablet Take 81 mg by mouth daily. 03/22/19  Yes [provider]  carvedilol (COREG) 25 MG tablet Take 1 tablet (25 mg total) by mouth 2 (two) times daily. 08/20/19 09/19/19 Yes Charise Killian, MD  Cholecalciferol (VITAMIN D-3) 5000 units TABS Take 5,000 Units by mouth daily.   Yes [provider]  finasteride (PROSCAR) 5 MG tablet Take 1 tablet (5 mg total) by mouth daily. 01/30/20  Yes Stoioff, Verna Czech, MD  latanoprost (XALATAN) 0.005 % ophthalmic solution Apply 1 drop to eye at bedtime.   Yes [provider]  levothyroxine (SYNTHROID, LEVOTHROID) 50 MCG tablet Take 50 mcg by mouth daily before breakfast. 02/11/17  Yes [provider]  Multiple Vitamins-Minerals (PRESERVISION AREDS 2 PO) Take 1  capsule by mouth 2 (two) times daily.   Yes [provider]  timolol (TIMOPTIC) 0.5 % ophthalmic solution Place 1 drop into both eyes at bedtime.   Yes [provider]  vitamin B-12 (CYANOCOBALAMIN) 500 MCG tablet Take 500 mcg by mouth See admin instructions. Take 500 mcg by mouth on Tuesday and Friday   Yes [provider]     Vital Signs: BP (!) 169/107   Pulse 71   Temp 97.6 F (36.4 C) (Axillary)   Resp 16   Ht 6' (1.829 m)   Wt 182 lb 15.7 oz (83 kg)   SpO2 100%   BMI 24.82 kg/m   Physical Exam Vitals and nursing note reviewed.  Constitutional:      General: He is not in acute distress.    Comments: Intubated without sedation. Opens eyes to voice.  Pulmonary:     Effort: Pulmonary effort is normal. No respiratory distress.     Comments: Intubated without sedation. Skin:    General: Skin is warm and dry.     Comments: Right femoral puncture site soft without active bleeding or hematoma.  Neurological:     Comments: Intubated without sedation. He opens eyes to voice and follows simple commands. PERRL bilaterally. Right gaze preference. Can spontaneously move right side, LLE withdraws from pain, no spontaneous movements of LUE. Distal pulses (TPs) palpable bilaterally with Doppler.     Imaging: CT Code Stroke CTA Head W/WO contrast  Addendum Date: 2020-03-29   ADDENDUM REPORT: March 29, 2020 10:06 ADDENDUM: These results were called by telephone at the time of interpretation on March 29, 2020 at 10:00 am to provider [facility] , who verbally acknowledged these results. Electronically Signed   By: Allegra Lai  Emekauwa M.D.   On: 03/08/2020 10:06   Result Date: 03/28/2020 CLINICAL DATA:  Focal neuro deficit, > 6 hrs, stroke suspected EXAM: CT ANGIOGRAPHY HEAD AND NECK TECHNIQUE: Multidetector CT imaging of the head and neck was performed using the standard protocol during bolus administration of intravenous contrast. Multiplanar CT image  reconstructions and MIPs were obtained to evaluate the vascular anatomy. Carotid stenosis measurements (when applicable) are obtained utilizing NASCET criteria, using the distal internal carotid diameter as the denominator. CONTRAST:  75mL OMNIPAQUE IOHEXOL 350 MG/ML SOLN COMPARISON:  03/01/2020 head CT and prior. FINDINGS: CTA NECK FINDINGS Aortic arch: Mild aortic arch atherosclerotic calcifications. No dissection or aneurysm. Patent great vessel origins. Standard branching. Right carotid system: Patent. Carotid bifurcation calcified and noncalcified atheromatous disease with 50% proximal ICA luminal narrowing. Left carotid system: Patent. Carotid bifurcation calcified and noncalcified atheromatous disease with approximately 30% luminal narrowing. Vertebral arteries: Codominant. Patent. Mild to moderate left vertebral artery origin narrowing secondary to noncalcified atheromatous plaque. Skeleton: No acute finding.  Multilevel spondylosis. Other neck: No adenopathy.  No soft tissue mass. Upper chest: Minimal subsegmental atelectasis.  No acute finding. Review of the MIP images confirms the above findings CTA HEAD FINDINGS Anterior circulation: Right M1 segment large vessel occlusion. Patent ICAs. Bilateral carotid siphon atherosclerotic calcifications. Patent ACAs. Patent left MCA. Posterior circulation: Patent. Bilateral V4 segment atherosclerotic calcifications with mild luminal narrowing. Diminutive basilar artery. Patent superior cerebellar and posterior cerebral arteries. Fetal origin of the right PCA. Mild multifocal narrowing involving the right P3 and left P2 segments. Venous sinuses: As permitted by contrast timing, patent. Anatomic variants: Please see above. Review of the MIP images confirms the above findings IMPRESSION: Right M1 segment large vessel occlusion. No large vessel occlusion within the neck. 50% right and 30% left proximal ICA luminal narrowing. Mild-to-moderate left vertebral artery origin  narrowing. Mild narrowing of the bilateral V4 segments, right P3 and left P2 segments. Electronically Signed: By: Stana Buntinghikanele  Emekauwa M.D. On: 03/14/2020 09:58   CT Code Stroke CTA Neck W/WO contrast  Addendum Date: 03/24/2020   ADDENDUM REPORT: 03/19/2020 10:06 ADDENDUM: These results were called by telephone at the time of interpretation on 03/08/2020 at 10:00 am to provider [facility] , who verbally acknowledged these results. Electronically Signed   By: Stana Buntinghikanele  Emekauwa M.D.   On: 03/03/2020 10:06   Result Date: 03/10/2020 CLINICAL DATA:  Focal neuro deficit, > 6 hrs, stroke suspected EXAM: CT ANGIOGRAPHY HEAD AND NECK TECHNIQUE: Multidetector CT imaging of the head and neck was performed using the standard protocol during bolus administration of intravenous contrast. Multiplanar CT image reconstructions and MIPs were obtained to evaluate the vascular anatomy. Carotid stenosis measurements (when applicable) are obtained utilizing NASCET criteria, using the distal internal carotid diameter as the denominator. CONTRAST:  75mL OMNIPAQUE IOHEXOL 350 MG/ML SOLN COMPARISON:  03/28/2020 head CT and prior. FINDINGS: CTA NECK FINDINGS Aortic arch: Mild aortic arch atherosclerotic calcifications. No dissection or aneurysm. Patent great vessel origins. Standard branching. Right carotid system: Patent. Carotid bifurcation calcified and noncalcified atheromatous disease with 50% proximal ICA luminal narrowing. Left carotid system: Patent. Carotid bifurcation calcified and noncalcified atheromatous disease with approximately 30% luminal narrowing. Vertebral arteries: Codominant. Patent. Mild to moderate left vertebral artery origin narrowing secondary to noncalcified atheromatous plaque. Skeleton: No acute finding.  Multilevel spondylosis. Other neck: No adenopathy.  No soft tissue mass. Upper chest: Minimal subsegmental atelectasis.  No acute finding. Review of the MIP images confirms the above findings  CTA HEAD FINDINGS Anterior  circulation: Right M1 segment large vessel occlusion. Patent ICAs. Bilateral carotid siphon atherosclerotic calcifications. Patent ACAs. Patent left MCA. Posterior circulation: Patent. Bilateral V4 segment atherosclerotic calcifications with mild luminal narrowing. Diminutive basilar artery. Patent superior cerebellar and posterior cerebral arteries. Fetal origin of the right PCA. Mild multifocal narrowing involving the right P3 and left P2 segments. Venous sinuses: As permitted by contrast timing, patent. Anatomic variants: Please see above. Review of the MIP images confirms the above findings IMPRESSION: Right M1 segment large vessel occlusion. No large vessel occlusion within the neck. 50% right and 30% left proximal ICA luminal narrowing. Mild-to-moderate left vertebral artery origin narrowing. Mild narrowing of the bilateral V4 segments, right P3 and left P2 segments. Electronically Signed: By: Stana Bunting M.D. On: 03/22/2020 09:58   MR ANGIO HEAD WO CONTRAST  Result Date: 03/12/2020 CLINICAL DATA:  Stroke, follow up EXAM: MRI HEAD WITHOUT CONTRAST MRA HEAD WITHOUT CONTRAST TECHNIQUE: Multiplanar, multiecho pulse sequences of the brain and surrounding structures were obtained without intravenous contrast. Angiographic images of the head were obtained using MRA technique without contrast. COMPARISON:  03/22/20 and prior. FINDINGS: MRI HEAD FINDINGS Brain: Sequela of acute/subacute right MCA territory insult with petechial hemorrhages. Tiny right cerebellar acute infarct. No midline shift, ventriculomegaly or extra-axial fluid collection. No mass lesion. Mild cerebral atrophy with ex vacuo dilatation. Chronic microvascular ischemic changes. Chronic lacunar insults involving the right caudate, posterior limb of the right internal capsule and left insula. Remote right cerebellar insult. Vascular: Please see MRA. Skull and upper cervical spine: Prior right frontoparietal  burr holes. Sinuses/Orbits: Sequela of bilateral lens replacement. Ethmoid and left maxillary sinus mucosal thickening. Other: None. MRA HEAD FINDINGS Anterior circulation: Interval revascularization of the right MCA. Patent ICAs. Patent ACAs. Patent left MCA with mild M2 segment narrowing. Posterior circulation: Patent. Mild bilateral V4 segments irregularity and mild right narrowing likely reflects atheromatous disease. Patent PICA. Patent basilar and superior cerebellar arteries. Patent posterior cerebral arteries. Fetal origin of the right PCA. Mild irregularity and narrowing of the right P2 segment likely secondary to atherosclerotic disease. Venous sinuses: No evidence of thrombosis. Anatomic variants: Please see above. IMPRESSION: Sequela of acute/subacute right MCA territory infarct. Small acute right cerebellar insult. Interval revascularization of the right MCA. No high-grade intracranial narrowing or large vessel occlusion. Mild narrowing of the right V4, right P2 and left M2 segments. Electronically Signed   By: Stana Bunting M.D.   On: 03/12/2020 11:21   MR BRAIN WO CONTRAST  Result Date: 03/12/2020 CLINICAL DATA:  Stroke, follow up EXAM: MRI HEAD WITHOUT CONTRAST MRA HEAD WITHOUT CONTRAST TECHNIQUE: Multiplanar, multiecho pulse sequences of the brain and surrounding structures were obtained without intravenous contrast. Angiographic images of the head were obtained using MRA technique without contrast. COMPARISON:  Mar 22, 2020 and prior. FINDINGS: MRI HEAD FINDINGS Brain: Sequela of acute/subacute right MCA territory insult with petechial hemorrhages. Tiny right cerebellar acute infarct. No midline shift, ventriculomegaly or extra-axial fluid collection. No mass lesion. Mild cerebral atrophy with ex vacuo dilatation. Chronic microvascular ischemic changes. Chronic lacunar insults involving the right caudate, posterior limb of the right internal capsule and left insula. Remote right cerebellar  insult. Vascular: Please see MRA. Skull and upper cervical spine: Prior right frontoparietal burr holes. Sinuses/Orbits: Sequela of bilateral lens replacement. Ethmoid and left maxillary sinus mucosal thickening. Other: None. MRA HEAD FINDINGS Anterior circulation: Interval revascularization of the right MCA. Patent ICAs. Patent ACAs. Patent left MCA with mild M2 segment narrowing. Posterior circulation: Patent. Mild bilateral V4 segments  irregularity and mild right narrowing likely reflects atheromatous disease. Patent PICA. Patent basilar and superior cerebellar arteries. Patent posterior cerebral arteries. Fetal origin of the right PCA. Mild irregularity and narrowing of the right P2 segment likely secondary to atherosclerotic disease. Venous sinuses: No evidence of thrombosis. Anatomic variants: Please see above. IMPRESSION: Sequela of acute/subacute right MCA territory infarct. Small acute right cerebellar insult. Interval revascularization of the right MCA. No high-grade intracranial narrowing or large vessel occlusion. Mild narrowing of the right V4, right P2 and left M2 segments. Electronically Signed   By: Stana Bunting M.D.   On: 03/12/2020 11:21   DG Chest Port 1 View  Result Date: 03/12/2020 CLINICAL DATA:  Abnormal respiration. EXAM: PORTABLE CHEST 1 VIEW COMPARISON:  03/29/2020 and prior FINDINGS: Slight interval retraction of ETT with tip approximately 2.5 cm above the level of the clavicles. Grossly unchanged esophageal temperature probe. No pneumothorax or pleural effusion. Increased patchy right basilar opacities. Cardiomegaly. IMPRESSION: Increased bibasilar opacities. Slight interval retraction of ETT. Electronically Signed   By: Stana Bunting M.D.   On: 03/12/2020 06:43   DG Chest Port 1 View  Result Date: 03/24/2020 CLINICAL DATA:  86 year old male with stroke. EXAM: PORTABLE CHEST 1 VIEW COMPARISON:  08/17/2019 FINDINGS: Unchanged cardiomegaly. Interval insertion of  endotracheal tube with the tip in the midthoracic trachea. Esophageal temperature probe is positioned in the upper third of the thorax. Bibasilar subsegmental atelectasis. No significant pleural effusion or pneumothorax. No acute osseous abnormality. IMPRESSION: 1. Endotracheal tube distal tip in the midthoracic trachea. 2. Bibasilar subsegmental atelectasis. Electronically Signed   By: Marliss Coots MD   On: 03/29/2020 14:13   DG Abd Portable 1V  Result Date: 03/13/2020 CLINICAL DATA:  Orogastric tube placement EXAM: PORTABLE ABDOMEN - 1 VIEW COMPARISON:  None. FINDINGS: Orogastric tube tip and side port are in the distal stomach with the tip near the pylorus. There is no bowel dilatation air-fluid level to suggest bowel obstruction. No free air. There is bibasilar atelectasis with small pleural effusions. There are staghorn calculi in the left kidney, stable. IMPRESSION: Orogastric tube tip and side port in distal stomach. No bowel obstruction or free air evident. Apparent staghorn calculi in left kidney, stable. Small pleural effusions bilaterally with bibasilar atelectasis. Electronically Signed   By: Bretta Bang III M.D.   On: 03/13/2020 07:52   DG Abd Portable 1V  Result Date: 03/29/2020 CLINICAL DATA:  OG tube placement. EXAM: PORTABLE ABDOMEN - 1 VIEW COMPARISON:  Chest radiograph earlier today. FINDINGS: Enteric tube is in place with tip in the region of the distal esophagus, side-port in the mid thorax. Recommend advancement of greater than 10 cm for optimal placement. Nonobstructive bowel gas pattern. There is excreted IV contrast in the renal collecting systems. IMPRESSION: Enteric tube with tip in the distal esophagus, side-port in the mid esophagus. Recommend advancement of greater than 10 cm for optimal placement. Electronically Signed   By: Narda Rutherford M.D.   On: 03/01/2020 22:48   ECHOCARDIOGRAM COMPLETE  Result Date: 03/01/2020    ECHOCARDIOGRAM REPORT   Patient Name:    Jeffrey Ashley Date of Exam: 03/14/2020 Medical Rec #:  [id number]         Height:       72.0 in Accession #:    [id number]        Weight:       183.0 lb Date of Birth:  01-24-1927         BSA:  2.052 m Patient Age:    86 years          BP:           134/69 mmHg Patient Gender: M                 HR:           76 bpm. Exam Location:  Inpatient Procedure: 2D Echo, Cardiac Doppler and Color Doppler Indications:    Stroke  History:        Patient has no prior history of Echocardiogram examinations.                 Arrythmias:Atrial Fibrillation; Risk Factors:Hypertension.  Sonographer:    Ross Ludwig RDCS (AE) Referring Phys: [phone] [facility]  Sonographer Comments: Echo performed with patient supine and on artificial respirator. IMPRESSIONS  1. Left ventricular ejection fraction, by estimation, is 50 to 55%. The left ventricle has low normal function. The left ventricle has no regional wall motion abnormalities. There is severe concentric left ventricular hypertrophy. Left ventricular diastolic parameters are indeterminate.  2. Right ventricular systolic function is normal. The right ventricular size is normal.  3. Left atrial size was severely dilated.  4. Right atrial size was severely dilated.  5. A small pericardial effusion is present. The pericardial effusion is surrounding the apex.  6. The mitral valve is grossly normal. Trivial mitral valve regurgitation.  7. The aortic valve is tricuspid. There is mild calcification of the aortic valve. Aortic valve regurgitation is mild. No aortic stenosis is present.  8. The inferior vena cava is normal in size with greater than 50% respiratory variability, suggesting right atrial pressure of 3 mmHg. Comparison(s): No prior Echocardiogram. FINDINGS  Left Ventricle: Left ventricular ejection fraction, by estimation, is 50 to 55%. The left ventricle has low normal function. The left ventricle has no regional wall motion abnormalities. The left ventricular  internal cavity size was normal in size. There is severe concentric left ventricular hypertrophy. Left ventricular diastolic parameters are indeterminate. Right Ventricle: The right ventricular size is normal. No increase in right ventricular wall thickness. Right ventricular systolic function is normal. Left Atrium: Left atrial size was severely dilated. Right Atrium: Right atrial size was severely dilated. Pericardium: A small pericardial effusion is present. The pericardial effusion is surrounding the apex. Mitral Valve: The mitral valve is grossly normal. Trivial mitral valve regurgitation. Tricuspid Valve: The tricuspid valve is normal in structure. Tricuspid valve regurgitation is mild. Aortic Valve: The aortic valve is tricuspid. There is mild calcification of the aortic valve. Aortic valve regurgitation is mild. Aortic regurgitation PHT measures 806 msec. No aortic stenosis is present. Aortic valve mean gradient measures 1.7 mmHg. Aortic valve peak gradient measures 2.9 mmHg. Aortic valve area, by VTI measures 2.01 cm. Pulmonic Valve: The pulmonic valve was normal in structure. Pulmonic valve regurgitation is trivial. Aorta: The aortic root and ascending aorta are structurally normal, with no evidence of dilitation. Venous: The inferior vena cava is normal in size with greater than 50% respiratory variability, suggesting right atrial pressure of 3 mmHg. IAS/Shunts: The atrial septum is grossly normal.  LEFT VENTRICLE PLAX 2D LVIDd:         4.20 cm LVIDs:         3.00 cm LV PW:         2.10 cm LV IVS:        1.70 cm LVOT diam:     1.90 cm LV SV:  37 LV SV Index:   18 LVOT Area:     2.84 cm  RIGHT VENTRICLE RV Basal diam:  3.40 cm RV S prime:     9.02 cm/s TAPSE (M-mode): 1.8 cm LEFT ATRIUM              Index       RIGHT ATRIUM           Index LA diam:        5.00 cm  2.44 cm/m  RA Area:     27.20 cm LA Vol (A2C):   125.0 ml 60.93 ml/m RA Volume:   81.20 ml  39.58 ml/m LA Vol (A4C):   101.0 ml  49.23 ml/m LA Biplane Vol: 114.0 ml 55.57 ml/m  AORTIC VALVE AV Area (Vmax):    2.01 cm AV Area (Vmean):   1.82 cm AV Area (VTI):     2.01 cm AV Vmax:           84.47 cm/s AV Vmean:          61.033 cm/s AV VTI:            0.185 m AV Peak Grad:      2.9 mmHg AV Mean Grad:      1.7 mmHg LVOT Vmax:         59.87 cm/s LVOT Vmean:        39.133 cm/s LVOT VTI:          0.131 m LVOT/AV VTI ratio: 0.71 AI PHT:            806 msec  AORTA Ao Root diam: 3.60 cm Ao Asc diam:  3.50 cm TRICUSPID VALVE TR Peak grad:   25.4 mmHg TR Vmax:        252.00 cm/s  SHUNTS Systemic VTI:  0.13 m Systemic Diam: 1.90 cm Riley Lam MD Electronically signed by Riley Lam MD Signature Date/Time: 03/07/2020/4:41:09 PM    Final    CT HEAD CODE STROKE WO CONTRAST  Result Date: 03/12/2020 CLINICAL DATA:  Code stroke.  Neuro deficit, acute, stroke suspected EXAM: CT HEAD WITHOUT CONTRAST TECHNIQUE: Contiguous axial images were obtained from the base of the skull through the vertex without intravenous contrast. COMPARISON:  08/17/2019 and prior FINDINGS: Brain: No acute infarct or intracranial hemorrhage. No mass lesion. No midline shift, ventriculomegaly or extra-axial fluid collection. Mild cerebral atrophy with ex vacuo dilatation. Chronic microvascular ischemic changes. Chronic hypodensities involving the right basal ganglia, posterior limb of the right internal capsule and left insula, remote lacunar insult versus dilated perivascular spaces. Vascular: No hyperdense vessel or unexpected calcification. Skull: No acute finding.  Prior right frontoparietal burr holes. Sinuses/Orbits: No acute orbital finding. Moderate ethmoid and left maxillary sinus disease. Other: None. ASPECTS [facility] Stroke Program Early CT Score) - Ganglionic level infarction (caudate, lentiform nuclei, internal capsule, insula, M1-M3 cortex): 7 - Supraganglionic infarction (M4-M6 cortex): 3 Total score (0-10 with 10 being normal): 10 IMPRESSION:  1. No acute intracranial process. Mild cerebral atrophy and chronic microvascular ischemic changes. 2. ASPECTS is 10 3. Remote lacunar insult versus dilated perivascular spaces involving the right basal ganglia, posterior limb of the right internal capsule and left insula. Code stroke imaging results were communicated on 03/14/2020 at 9:39 am to provider Dr. Amada Jupiter via secure text paging. Electronically Signed   By: Stana Bunting M.D.   On: 03/15/2020 09:41    Labs:  CBC: Recent Labs    08/20/19 0420 03/30/2020 0928 03/03/2020 0933 03/04/2020 1452  03/12/20 0054 03/13/20 0654  WBC 9.7  --  6.0  --  9.1 10.3  HGB 13.0   < > 13.0 11.2* 11.6* 11.4*  HCT 38.1*   < > 41.2 33.0* 35.3* 35.0*  PLT 144*  --  173  --  150 146*   < > = values in this interval not displayed.    COAGS: Recent Labs    03-13-20 0933  INR 1.1  APTT 30    BMP: Recent Labs    08/18/19 0527 08/18/19 1727 08/19/19 0730 08/20/19 0420 01/21/20 0933 March 13, 2020 0928 Mar 13, 2020 0933 2020-03-13 1452 03/12/20 0054 03/13/20 0654  NA 137  --  136 136  --  139 136 140 137 142  K 4.5  --  4.3 4.3  --  4.1 4.2 3.9 3.9 3.5  CL 104  --  104 105  --  106 105  --  109 112*  CO2 25  --  25 25  --   --  22  --  17* 19*  GLUCOSE 147*  --  127* 126*  --  143* 149*  --  155* 120*  BUN 24*  --  26* 28*  --  24* 23  --  24* 24*  CALCIUM 8.9  --  8.3* 8.5*  --   --  8.7*  --  8.2* 8.3*  CREATININE 0.74 0.84 0.76 0.78   < > 1.10 1.26*  --  1.21 1.25*  GFRNONAA >60 >60 >60 >60  --   --  54*  --  57* 54*  GFRAA >60 >60 >60 >60  --   --   --   --   --   --    < > = values in this interval not displayed.    LIVER FUNCTION TESTS: Recent Labs    03/13/20 0933  BILITOT 0.9  AST 18  ALT 16  ALKPHOS 82  PROT 5.9*  ALBUMIN 3.4*    Assessment and Plan:  History of acute CVA s/p cerebral arteriogram with emergent mechanical thrombectomy of right MCA M1 occlusion achieving a TICI 3 revascularization via right femoral  approach 03-13-2020 by Dr. Corliss Skains. Patient's condition stable- remains intubated without sedation, opens eyes to voice and follows simple commands, can spontaneously move right side, LLE withdraws from pain, no spontaneous movements of LUE. Right femoral puncture site stable, distal pulses (PTs) palpable bilaterally with Doppler. Further plans per neurology/CCM- appreciate and agree with management. Please call NIR with questions/concerns.   Electronically Signed: Elwin Mocha, PA-C 03/13/2020, 9:36 AM   I spent a total of 25 Minutes at the the patient's bedside AND on the patient's hospital floor or unit, greater than 50% of which was counseling/coordinating care for CVA s/p revascularization.

## 2020-03-13 NOTE — Progress Notes (Signed)
NAME:  Jeffrey Ashley, MRN:  [id number], DOB:  December 23, 1926, LOS: 2 ADMISSION DATE:  03/01/2020, CONSULTATION DATE: 03/12/2019 REFERRING MD: Neuro interventionist, CHIEF COMPLAINT: Ventilator dependent respiratory failure  Brief History   86 year old nursing home resident with acute onset left-sided weakness. Brought in as code stroke, seen by neurology, head CT negative, given IV TPA and taken to neuro IR, right common femoral approach complete revascularization of right MCA M1 occlusion  Post procedures head CT no ICH or mass-effect.  He was left intubated due to suspected aspiration.  MRI on 12/12-large right MCA infarct, involvement of the right basal ganglia, hemorrhagic conversion, slight mass-effect  Of note, he was DNR, after speaking to son -ID physician, intubated for procedure   Off sedation, not weaning-apneic  Past Medical History   Past Medical History:  Diagnosis Date  . 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 Dysrhythmia    AFIB  . HOH (hard of hearing)   . Hypertension   . Hypothyroidism   . Nontraumatic subdural hematoma (HCC)   . 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 No acute intracranial process. Mild cerebral atrophy and chronic microvascular ischemic changes. 2. ASPECTS is 10 3. Remote lacunar insult versus dilated perivascular spaces involving the right basal ganglia, posterior limb of the right internal capsule and left insula.  MRI head 12/12 Brain: Sequela of acute/subacute right MCA territory insult with petechial hemorrhages. Tiny right cerebellar acute infarct. No midline shift, ventriculomegaly or extra-axial fluid collection.  No mass lesion. Mild cerebral atrophy with ex vacuo dilatation. Chronic microvascular ischemic changes. Chronic lacunar insults involving the right caudate, posterior limb of the right internal capsule and left insula. Remote right cerebellar insult.  Micro Data:  12/11 resp-no organism so far  Antimicrobials:  03/29/2020 Unasyn >>  Interim history/subjective:   Off sedation Critically ill, intubated  Objective   Blood pressure 140/77, pulse 85, temperature 97.8 F (36.6 C), temperature source Oral, resp. rate 16, height 6' (1.829 m), weight 83 kg, SpO2 100 %.    Vent Mode: PRVC FiO2 (%):  [40 %] 40 % Set Rate:  [16 bmp] 16 bmp Vt Set:  [620 mL] 620 mL PEEP:  [5 cmH20] 5 cmH20 Plateau Pressure:  [15 cmH20-16 cmH20] 15 cmH20   Intake/Output Summary (Last 24 hours) at 03/13/2020 0739 Last data filed at 03/13/2020 4098 Gross per 24 hour  Intake 1815.46 ml  Output 700 ml  Net 1115.46 ml   Filed Weights   03/29/2020 0900 03/30/2020 1191  Weight: 83 kg 83 kg    Examination: Gen:      elderly man, no distress  HEENT:  EOMI, sclera anicteric, mild pallor Neck:     No JVD; no thyromegaly Lungs:    Fair air entry bilaterally, no added sounds CV:        S1-S2 appreciated with no murmur Abd:      Bowel sounds appreciated Ext:    No edema, no clubbing Skin:      Warm and dry Neuro: Withdraws extremities to painful stimuli, attempts to open eyes with name calling  Chest x-ray independently reviewed, right lung infiltrate  Resolved Hospital Problem list     Assessment & Plan:  Postop respiratory failure secondary to code stroke and suspected aspiration. -Continue ventilator support -Continue empiric Unasyn for aspiration, cultures unrevealing at the present time but x-ray does have new infiltrative process -We will continue to monitor -Weaning as tolerated  Right MCA CVA status post TPA and  thrombectomy  -Neurology continues to follow -Neurology notes by Dr. Roda Shutters noted,  discussions with patient's son noted - Hypertension -aggressive blood pressure control, use Cleviprex if needed with goal systolic blood pressure 1 20-1 40 range  History of atrial fibrillation Not on anticoagulation   Concern for right lower extremity ischemia  -Warm to touch bilaterally  Best practice (evaluated daily)   Diet: npo Pain/Anxiety/Delirium protocol (if indicated): Was on propofol, on hold VAP protocol (if indicated): Probable aspiration, will continue Unasyn DVT prophylaxis: Compression hose GI prophylaxis: PPI Glucose control: Pain scale insulin protocol Mobility: Bedrest Will update family Code Status: Partial code no CPR no shock Disposition: Intensive care unit   Labs   CBC: Recent Labs  Lab 03/05/2020 0928 03/30/2020 0933 03/25/2020 1452 03/12/20 0054 03/13/20 0654  WBC  --  6.0  --  9.1 10.3  NEUTROABS  --  3.7  --  8.1*  --   HGB 13.6 13.0 11.2* 11.6* 11.4*  HCT 40.0 41.2 33.0* 35.3* 35.0*  MCV  --  93.6  --  91.0 92.1  PLT  --  173  --  150 146*    Basic Metabolic Panel: Recent Labs  Lab 03/25/2020 0928 03/18/2020 0933 03/18/2020 1452 03/12/20 0054  NA 139 136 140 137  K 4.1 4.2 3.9 3.9  CL 106 105  --  109  CO2  --  22  --  17*  GLUCOSE 143* 149*  --  155*  BUN 24* 23  --  24*  CREATININE 1.10 1.26*  --  1.21  CALCIUM  --  8.7*  --  8.2*  MG  --   --   --  1.6*  PHOS  --   --   --  2.6   GFR: Estimated Creatinine Clearance: 43.6 mL/min (by C-G formula based on SCr of 1.21 mg/dL). Recent Labs  Lab 03/22/2020 0933 03/12/20 0054 03/13/20 0654  WBC 6.0 9.1 10.3    Liver Function Tests: Recent Labs  Lab 03/24/2020 0933  AST 18  ALT 16  ALKPHOS 82  BILITOT 0.9  PROT 5.9*  ALBUMIN 3.4*   No results for input(s): LIPASE, AMYLASE in the last 168 hours. No results for input(s): AMMONIA in the last 168 hours.  ABG    Component Value Date/Time   PHART 7.449 03/28/2020 1452   PCO2ART 28.1 (L) 03/27/2020 1452   PO2ART 243 (H)  03/12/2020 1452   HCO3 20.2 03/31/2020 1452   TCO2 21 (L) 03/21/2020 1452   ACIDBASEDEF 4.0 (H) 03/21/2020 1452   O2SAT 100.0 03/10/2020 1452     Coagulation Profile: Recent Labs  Lab 03/25/2020 0933  INR 1.1    Cardiac Enzymes: No results for input(s): CKTOTAL, CKMB, CKMBINDEX, TROPONINI in the last 168 hours.  HbA1C: Hgb A1c MFr Bld  Date/Time Value Ref Range Status  03/12/2020 12:56 AM 5.8 (H) 4.8 - 5.6 % Final    Comment:    (NOTE) Pre diabetes:          5.7%-6.4%  Diabetes:              >6.4%  Glycemic control for   <7.0% adults with diabetes    The patient is critically ill with  multiple organ systems failure and requires high complexity decision making for assessment and support, frequent evaluation and titration of therapies, application of advanced monitoring technologies and extensive interpretation of multiple databases. Critical Care Time devoted to patient care services described in this note independent of APP/resident time (if applicable)  is 30 minutes.   Virl Diamond MD [facility] Personal pager: [phone] If unanswered, please page CCM On-call: [phone]

## 2020-03-13 NOTE — TOC Initial Note (Signed)
Transition of Care [facility]) - Initial/Assessment Note    Patient Details  Name: Jeffrey Ashley MRN: [id number] Date of Birth: January 11, 1927  Transition of Care [name]) CM/SW Contact:    Glennon Mac, RN Phone Number: 03/13/2020, 1155 Clinical Narrative:   Pt admitted on 06-Apr-2020 with large right MCA infarct with hemorrhagic conversion.  He currently is intubated.   PTA, pt resided at [facility]. Son Renae Fickle at bedside confirms this, and acknowledges that pt likely will need SNF at discharge.  Assured son that [facility] team will follow to ensure safe discharge back to appropriate level of care.                  Expected Discharge Plan: Assisted Living Barriers to Discharge: Continued Medical Work up   Patient Goals and CMS Choice        Expected Discharge Plan and Services Expected Discharge Plan: Assisted Living   Discharge Planning Services: CM Consult                                          Prior Living Arrangements/Services   Lives with:: Facility Resident Patient language and need for interpreter reviewed:: Yes        Need for Family Participation in Patient Care: Yes (Comment) Care giver support system in place?: Yes (comment)   Criminal Activity/Legal Involvement Pertinent to Current Situation/Hospitalization: No - Comment as needed  Activities of Daily Living      Permission Sought/Granted                  Emotional Assessment Appearance:: Appears stated age Attitude/Demeanor/Rapport: Unable to Assess,Intubated (Following Commands or Not Following Commands) Affect (typically observed): Unable to Assess        Admission diagnosis:  Respiration abnormal [R06.9] Stroke [facility]) [I63.9] Stroke (cerebrum) (HCC) [I63.9] Cerebrovascular accident (CVA), unspecified mechanism (HCC) [I63.9] Middle cerebral artery embolism, right [I66.01] Patient Active Problem List   Diagnosis Date Noted  . Stroke (HCC) 2020-04-06  . Stroke  (cerebrum) (HCC) 04-06-2020  . Middle cerebral artery embolism, right 04/06/2020  . Glaucoma 12/09/2019  . Atrial fibrillation, chronic (HCC) 08/17/2019  . Essential hypertension 08/17/2019  . Hypothyroidism 08/17/2019   PCP:  Karie Schwalbe, MD Pharmacy:   [facility] - [place], [place] - [address] [place] [address] Phone: [phone] Fax: [phone]     Social Determinants of Health (SDOH) Interventions    Readmission Risk Interventions No flowsheet data found.   Quintella Baton, RN, BSN  Trauma/Neuro ICU Case Manager [phone]

## 2020-03-13 NOTE — Discharge Instructions (Addendum)
Femoral Site Care This sheet gives you information about how to care for yourself after your procedure. Your health care provider may also give you more specific instructions. If you have problems or questions, contact your health care provider. What can I expect after the procedure? After the procedure, it is common to have:  Bruising that usually fades within 1-2 weeks.  Tenderness at the site. Follow these instructions at home: Wound care 1. Follow instructions from your health care provider about how to take care of your insertion site. Make sure you: ? Wash your hands with soap and water before you change your bandage (dressing). If soap and water are not available, use hand sanitizer. ? Change your dressing as directed- pressure dressing removed 24 hours post-procedure (and switch for bandaid), bandaid removed 72 hours post-procedure 2. Do not take baths, swim, or use a hot tub for 7 days post-procedure. 3. You may shower 48 hours after the procedure or as told by your health care provider. ? Gently wash the site with plain soap and water. ? Pat the area dry with a clean towel. ? Do not rub the site. This may cause bleeding. 4. Check your site every day for signs of infection. Check for: ? Redness, swelling, or pain. ? Fluid or blood. ? Warmth. ? Pus or a bad smell. Activity  Do not stoop, bend, or lift anything that is heavier than 10 lb (4.5 kg) for 2 weeks post-procedure.  Do not drive self for 2 weeks post-procedure. Contact a health care provider if you have:  A fever or chills.  You have redness, swelling, or pain around your insertion site. Get help right away if:  The catheter insertion area swells very fast.  You pass out.  You suddenly start to sweat or your skin gets clammy.  The catheter insertion area is bleeding, and the bleeding does not stop when you hold steady pressure on the area.  The area near or just beyond the catheter insertion site becomes  pale, cool, tingly, or numb. These symptoms may represent a serious problem that is an emergency. Do not wait to see if the symptoms will go away. Get medical help right away. Call your local emergency services (911 in the [place] Do not drive yourself to the hospital.  This information is not intended to replace advice given to you by your health care provider. Make sure you discuss any questions you have with your health care provider. Document Revised: 03/31/2017 Document Reviewed: 03/31/2017 Elsevier Patient Education  2020 Elsevier Inc. 

## 2020-03-13 NOTE — Progress Notes (Signed)
Pt noted to have large blood clots and large amount of blood coming from penis upon assessment. No active blood coming from penis at this time.CCM MD Resident Judeth Cornfield at bedside. Stat CBC ordered. Will continue to monitor.

## 2020-03-13 NOTE — Progress Notes (Signed)
STROKE TEAM PROGRESS NOTE   INTERVAL HISTORY No family at the bedside.  Patient still intubated, however, off sedation, patient able to slightly open eyes with repetitive stimulation, able to follow simple commands.  However, once weaned off ventilation, patient seem to have difficulty generating spontaneous breath, not able to extubate today.  Yesterday MRI showed large right MCA stroke.  OBJECTIVE Vitals:   03/13/20 0700 03/13/20 0742 03/13/20 0744 03/13/20 0800  BP: (!) 169/107 (!) 169/107    Pulse: 85 71    Resp: 18 16    Temp:    97.6 F (36.4 C)  TempSrc:    Axillary  SpO2: 100% 100% 100%   Weight:      Height:       CBC:  Recent Labs  Lab 03/25/2020 0933 03/06/2020 1452 03/12/20 0054 03/13/20 0654  WBC 6.0  --  9.1 10.3  NEUTROABS 3.7  --  8.1*  --   HGB 13.0   < > 11.6* 11.4*  HCT 41.2   < > 35.3* 35.0*  MCV 93.6  --  91.0 92.1  PLT 173  --  150 146*   < > = values in this interval not displayed.   Basic Metabolic Panel:  Recent Labs  Lab 03/12/20 0054 03/13/20 0654  NA 137 142  K 3.9 3.5  CL 109 112*  CO2 17* 19*  GLUCOSE 155* 120*  BUN 24* 24*  CREATININE 1.21 1.25*  CALCIUM 8.2* 8.3*  MG 1.6*  --   PHOS 2.6  --    Lipid Panel:     Component Value Date/Time   CHOL 123 03/12/2020 0054   TRIG 136 03/13/2020 0654   HDL 34 (L) 03/12/2020 0054   CHOLHDL 3.6 03/12/2020 0054   VLDL 18 03/12/2020 0054   LDLCALC 71 03/12/2020 0054   HgbA1c:  Lab Results  Component Value Date   HGBA1C 5.8 (H) 03/12/2020   Urine Drug Screen:     Component Value Date/Time   LABOPIA NEGATIVE 03/13/2012 1900   COCAINSCRNUR NEGATIVE 03/13/2012 1900   LABBENZ NEGATIVE 03/13/2012 1900   AMPHETMU NEGATIVE 03/13/2012 1900   THCU NEGATIVE 03/13/2012 1900   LABBARB NEGATIVE 03/13/2012 1900    Alcohol Level No results found for: ETH  IMAGING  CT Code Stroke CTA Head W/WO contrast CT Code Stroke CTA Neck W/WO contrast 03/18/2020   Right M1 segment large vessel  occlusion. No large vessel occlusion within the neck. 50% right and 30% left proximal ICA luminal narrowing. Mild-to-moderate left vertebral artery origin narrowing. Mild narrowing of the bilateral V4 segments, right P3 and left P2 segments.   CT HEAD CODE STROKE WO CONTRAST 03/07/2020 1. No acute intracranial process. Mild cerebral atrophy and chronic microvascular ischemic changes.  2. ASPECTS is 10  3. Remote lacunar insult versus dilated perivascular spaces involving the right basal ganglia, posterior limb of the right internal capsule and left insula.   MR BRAIN WO CONTRAST MR ANGIO HEAD WO CONTRAST 03/12/2020 Sequela of acute/subacute right MCA territory infarct. Small acute right cerebellar insult. Interval revascularization of the right MCA. No high-grade intracranial narrowing or large vessel occlusion. Mild narrowing of the right V4, right P2 and left M2 segments.   DG Chest Port 1 View 03/12/2020 Increased bibasilar opacities. Slight interval retraction of ETT.  03/10/2020 1. Endotracheal tube distal tip in the midthoracic trachea.  2. Bibasilar subsegmental atelectasis.   DG Abd Portable 1V 03/13/2020 Orogastric tube tip and side port in distal stomach. No bowel obstruction  or free air evident. Apparent staghorn calculi in left kidney, stable. Small pleural effusions bilaterally with bibasilar atelectasis.  03/12/2020 Enteric tube with tip in the distal esophagus, side-port in the mid esophagus. Recommend advancement of greater than 10 cm for optimal placement.   ECHOCARDIOGRAM COMPLETE 03/14/2020 1. Left ventricular ejection fraction, by estimation, is 50 to 55%. The left ventricle has low normal function. The left ventricle has no regional wall motion abnormalities. There is severe concentric left ventricular hypertrophy. Left ventricular diastolic parameters are indeterminate.   2. Right ventricular systolic function is normal. The right ventricular size is normal.   3.  Left atrial size was severely dilated.   4. Right atrial size was severely dilated.   5. A small pericardial effusion is present. The pericardial effusion is surrounding the apex.   6. The mitral valve is grossly normal. Trivial mitral valve regurgitation.   7. The aortic valve is tricuspid. There is mild calcification of the aortic valve. Aortic valve regurgitation is mild. No aortic stenosis is present.   8. The inferior vena cava is normal in size with greater than 50% respiratory variability, suggesting right atrial pressure of 3 mmHg. Comparison(s): No prior Echocardiogram.   ECG - atrial fibrillation - ventricular response 81 BPM (See cardiology reading for complete details)   PHYSICAL EXAM   Temp:  [96.8 F (36 C)-98.78 F (37.1 C)] 97.6 F (36.4 C) (12/13 0800) Pulse Rate:  [71-89] 71 (12/13 0742) Resp:  [14-18] 16 (12/13 0742) BP: (122-169)/(70-114) 169/107 (12/13 0742) SpO2:  [99 %-100 %] 100 % (12/13 0744) Arterial Line BP: (128-167)/(71-85) 167/85 (12/12 1600) FiO2 (%):  [40 %] 40 % (12/13 0744)  General - Well nourished, well developed, intubated off sedation.  Ophthalmologic - fundi not visualized due to noncooperation.  Cardiovascular - irregularly irregular heart rate and rhythm.  Neuro - intubated off propofol, eyes closed but open with repetitive stimulation and then was able to keep open, able to follow simple midline and right hand commands. With eye opening, eyes in right gaze preference position, barely cross midline, not blinking to visual threat on the left, inconsistently blinking on the right, pupils 18mm bilaterally reactive to light. Corneal reflex present bilaterally, gag and cough present. Breathing over the vent.  Facial symmetry not able to test due to ET tube.  Tongue protrusion not cooperative. RUE spontaneous movement able to against gravity, able to follow commands on the right hand. RLE proximal 2/5 and knee flexion 3+/5 on pain, LUE flaccid, LLE mild  withdraw movement of the knee. DTR 1+ and no babinski. Sensation, coordination not cooperative and gait not tested.   ASSESSMENT/PLAN Jeffrey Ashley is a 86 y.o. male with history of hypertension, hypothyroidism, hearing impaired, subdural hematoma in 2013 and atrial fibrillation not on anticoagulation who presents with dysarthria and left-sided weakness. The pt vomited en route and there was concern for possible aspiration.  The patient received IV t-PA Saturday 03/08/2020 at 9:45 AM. IR 03/12/2020 at 11:40 AM - S/P complete revascularization of RT MCA M1 occlusion achieving a TICI 3 revascularization.  Stroke: Rt MCA and small R cerebellar infarcts - embolic - likely from atrial fibrillation without anticoagulation  CT Head -  No acute intracranial process. Remote lacunar insult versus dilated perivascular spaces involving the right basal ganglia, posterior limb of the right internal capsule and left insula.   CTA H&N - Right M1 segment large vessel occlusion. No large vessel occlusion within the neck. 50% right and 30% left proximal  ICA luminal narrowing. Mild-to-moderate left vertebral artery origin narrowing. Mild narrowing of the bilateral V4 segments, right P3 and left P2 segments.  MRI / MRA head - R large extensive MCA infarct. Small R cerebellar infarct. Interval revascularization R MCA. Mild narrowing R V4, R P2, L M2  2D Echo - EF 50 - 55%. No cardiac source of emboli identified.   [employer] Virus 2 - negative  LDL - 71  HgbA1c - 5.8  VTE prophylaxis - Heparin 5000 units sq tid   aspirin 81 mg daily prior to admission, now on aspirin 325 mg daily   Therapy recommendations:  pending  Disposition:  Pending  Afib  Chronic afib but not sure about afib burden   Was on coumadin in the past, off after SDH  Never restarted due to advanced age, spontaneous SDH, gross hematuria  Follow with Dr. Marge Dunc[facility] at [facility]  Currently rate controlled  Respiratory  failure  Intubated on vent  Off sedation  CCM on board  Not a candidate to extubate today given difficulty with spontaneous breathing off vent  Mental status much improved  Aspiration pneumonia  Vomited x2 before intubation  T Max - 100.4->afebrile  IV Unasyn started 03/07/2020  WBC's - 6.0->9.1->10.3  Respiratory cultures - pending  CCM on board  Hypertension  Home BP meds: Coreg  Current BP meds: Resume Coreg half dose  Stable . BP goal post tPA < 180/105  . Long-term BP goal normotensive  Hyperlipidemia  Home Lipid lowering medication: none   LDL 71, goal < 70  Will decide on statin after GOC discussion  Dysphagia  N.p.o.  On Cortrak w/ TF @ 52 and IVF @ 27  Speech to follow  Other Stroke Risk Factors  Advanced age  Previous ETOH use  Hx of R SDH 2013 s/p craniotomy  Possible remote lacunar by imaging  Other Active Problems, Findings and Recommendations  Code status - Limited code CKD - stage 3a - creatinine - 1.26->1.21->1.25 on Vit D Hypothyroid on synthroid On Xalatan   Hypomagnesemia Mag 1.6 supplement - pending   Hematuria - catheter related trauma - monitoring  Hospital day # 2  This patient is critically ill due to large right MCA infarct, status post thrombectomy, A. fib not on AC, hypertensive urgency, aspiration pneumonia, respiratory failure, dysphagia and at significant risk of neurological worsening, death form recurrent stroke, hemorrhagic conversion, heart failure, sepsis, seizure. This patient's care requires constant monitoring of vital signs, hemodynamics, respiratory and cardiac monitoring, review of multiple databases, neurological assessment, discussion with family, other specialists and medical decision making of high complexity. I spent 35 minutes of neurocritical care time in the care of this patient.  Marvel Plan, MD PhD Stroke Neurology 03/13/2020 10:35 PM     To contact Stroke Continuity provider, please  refer to [email or web address]. After hours, contact General Neurology

## 2020-03-13 NOTE — Progress Notes (Addendum)
Called by nursing staff at bedside. Noted to have gross hematuria in condom cath with large blood clots noted on condom cath as well as on bed. Oozing blood from penis tip. Per nursing staff, had foley without hematuria earlier this morning. Possibly bleed from catheter related trauma. Will get stat cbc and monitor. Recent abdominal imaging notable for staghorn calculi in left kidney. May need CT abd/pelvis to assess calculi size. Will discuss with attending.

## 2020-03-14 DIAGNOSIS — E43 Unspecified severe protein-calorie malnutrition: Secondary | ICD-10-CM | POA: Insufficient documentation

## 2020-03-14 LAB — GLUCOSE, CAPILLARY
Glucose-Capillary: 114 mg/dL — ABNORMAL HIGH (ref 70–99)
Glucose-Capillary: 115 mg/dL — ABNORMAL HIGH (ref 70–99)
Glucose-Capillary: 104 mg/dL — ABNORMAL HIGH (ref 70–99)
Glucose-Capillary: 98 mg/dL (ref 70–99)
Glucose-Capillary: 91 mg/dL (ref 70–99)
Glucose-Capillary: 83 mg/dL (ref 70–99)

## 2020-03-14 LAB — POCT I-STAT 7, (LYTES, BLD GAS, ICA,H+H)
Acid-base deficit: 4 mmol/L — ABNORMAL HIGH (ref 0.0–2.0)
Bicarbonate: 19.3 mmol/L — ABNORMAL LOW (ref 20.0–28.0)
Calcium, Ion: 1.28 mmol/L (ref 1.15–1.40)
HCT: 30 % — ABNORMAL LOW (ref 39.0–52.0)
Hemoglobin: 10.2 g/dL — ABNORMAL LOW (ref 13.0–17.0)
O2 Saturation: 99 %
Patient temperature: 98
Potassium: 4 mmol/L (ref 3.5–5.1)
Sodium: 145 mmol/L (ref 135–145)
TCO2: 20 mmol/L — ABNORMAL LOW (ref 22–32)
pCO2 arterial: 29.4 mmHg — ABNORMAL LOW (ref 32.0–48.0)
pH, Arterial: 7.424 (ref 7.350–7.450)
pO2, Arterial: 140 mmHg — ABNORMAL HIGH (ref 83.0–108.0)

## 2020-03-14 LAB — CBC
HCT: 34.6 % — ABNORMAL LOW (ref 39.0–52.0)
Hemoglobin: 10.9 g/dL — ABNORMAL LOW (ref 13.0–17.0)
MCH: 29.6 pg (ref 26.0–34.0)
MCHC: 31.5 g/dL (ref 30.0–36.0)
MCV: 94 fL (ref 80.0–100.0)
Platelets: 132 10*3/uL — ABNORMAL LOW (ref 150–400)
RBC: 3.68 MIL/uL — ABNORMAL LOW (ref 4.22–5.81)
RDW: 14.5 % (ref 11.5–15.5)
WBC: 8.3 10*3/uL (ref 4.0–10.5)
nRBC: 0 % (ref 0.0–0.2)

## 2020-03-14 LAB — BASIC METABOLIC PANEL
Anion gap: 11 (ref 5–15)
BUN: 25 mg/dL — ABNORMAL HIGH (ref 8–23)
CO2: 20 mmol/L — ABNORMAL LOW (ref 22–32)
Calcium: 8.5 mg/dL — ABNORMAL LOW (ref 8.9–10.3)
Chloride: 114 mmol/L — ABNORMAL HIGH (ref 98–111)
Creatinine, Ser: 1.01 mg/dL (ref 0.61–1.24)
GFR, Estimated: >60 mL/min (ref >60)
Glucose, Bld: 130 mg/dL — ABNORMAL HIGH (ref 70–99)
Potassium: 3.5 mmol/L (ref 3.5–5.1)
Sodium: 145 mmol/L (ref 135–145)

## 2020-03-14 LAB — MAGNESIUM
Magnesium: 2.2 mg/dL (ref 1.7–2.4)
Magnesium: 2 mg/dL (ref 1.7–2.4)

## 2020-03-14 LAB — TRIGLYCERIDES: Triglycerides: 102 mg/dL (ref <150)

## 2020-03-14 LAB — PHOSPHORUS
Phosphorus: 3 mg/dL (ref 2.5–4.6)
Phosphorus: 3.5 mg/dL (ref 2.5–4.6)

## 2020-03-14 MED ORDER — CARVEDILOL 12.5 MG PO TABS
25.0000 mg | ORAL_TABLET | Freq: Two times a day (BID) | ORAL | Status: DC
  Administered 2020-03-15 – 2020-03-23 (×16): 25 mg
  Filled 2020-03-14 (×15): qty 2
  Filled 2020-03-14: qty 4

## 2020-03-14 MED ORDER — POTASSIUM CHLORIDE 20 MEQ PO PACK
40.0000 meq | PACK | Freq: Once | ORAL | Status: AC
  Administered 2020-03-14: 08:00:00 40 meq
  Filled 2020-03-14: qty 2

## 2020-03-14 MED ORDER — CARVEDILOL 12.5 MG PO TABS: 25.0000 mg | ORAL_TABLET | Freq: Two times a day (BID) | ORAL | Status: DC

## 2020-03-14 MED ORDER — CARVEDILOL 12.5 MG PO TABS
12.5000 mg | ORAL_TABLET | Freq: Once | ORAL | Status: AC
  Administered 2020-03-14: 11:00:00 12.5 mg

## 2020-03-14 MED ORDER — CARVEDILOL 12.5 MG PO TABS
12.5000 mg | ORAL_TABLET | Freq: Once | ORAL | Status: DC
  Filled 2020-03-14: qty 1

## 2020-03-14 NOTE — Progress Notes (Signed)
SLP Cancellation Note  Patient Details Name: Jeffrey Ashley MRN: [id number] DOB: Jan 31, 1927   Cancelled treatment:        Intubated- will check on tomorrow.    [name] 03/14/2020, 8:17 AM [name] Nurse, children's [phone] Office [phone]

## 2020-03-14 NOTE — Evaluation (Signed)
Physical Therapy Evaluation Patient Details Name: Jeffrey Ashley MRN: [id number] DOB: 10-May-1926 Today's Date: 03/14/2020   History of Present Illness  Patient is a 86 y/o male who presents with left sided weakness. 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 NIH:18. s/p tpA and complete revascularization right MCA 12/11. Remained intubated due to concerns for aspiration. Extubated 12/14. PMH includes HTN and Dysrhythmia.  Clinical Impression  Patient presents with left hemiplegia, right gaze preference, lethargy and impaired mobility s/p above. Pt's son present during session and provided PLOF/history. Pt was living at [facility] since August and was mod I for ADLs and using RW for ambulation. Was walking ~1 mile daily with RW and walking to dining hall for all meals. Today, pt is lethargic but following 1 step motor commands with RUE/LE. Pt withdraws from noxious stimulus on LUE/LLE. No verbalizations at this time as pt extubated earlier in AM. VSS throughout session, pt in A-fib. Difficulty coughing up secretions. Would benefit from SNF to maximize independence and mobility prior to return to ALF. Will follow acutely.     Follow Up Recommendations SNF;Supervision for mobility/OOB    Equipment Recommendations  None recommended by PT    Recommendations for Other Services       Precautions / Restrictions Precautions Precautions: Fall Precaution Comments: left hemi; left neglect Restrictions Weight Bearing Restrictions: No      Mobility  Bed Mobility               General bed mobility comments: Deferred per RN due to just being extubated and likely one way extubation.    Transfers                    Ambulation/Gait                Stairs            Wheelchair Mobility    Modified Rankin (Stroke Patients Only) Modified Rankin (Stroke Patients Only) Pre-Morbid Rankin Score: Moderately severe disability Modified Rankin: Severe disability      Balance                                             Pertinent Vitals/Pain Pain Assessment: Faces Faces Pain Scale: No hurt    Home Living Family/patient expects to be discharged to:: Skilled nursing facility               Home Equipment: Walker - 2 wheels      Prior Function Level of Independence: Independent with assistive device(s)         Comments: Lives at [facility]. Pt has been doing 1 mile per day with RW, doing own ADLs. Has been walking to dining hall for 3 meals per day. Son, present provided PLOF/histiory as pt not able to talk yet due to just being extubated and lethargic.     Hand Dominance   Dominant Hand: Right    Extremity/Trunk Assessment   Upper Extremity Assessment Upper Extremity Assessment: Defer to OT evaluation;RUE deficits/detail;LUE deficits/detail RUE Deficits / Details: Able to touch top of head, touch nose, squeeze hand and move RUE well to command. LUE Deficits / Details: Flaccid. Withdraws to noxious stimulus    Lower Extremity Assessment Lower Extremity Assessment: Difficult to assess due to impaired cognition;LLE deficits/detail LLE Deficits / Details: Flaccidity noted. Withdraws to noxious stimulus  Cervical / Trunk Assessment Cervical / Trunk Assessment: Other exceptions Cervical / Trunk Exceptions: Favors right cervical rotation.  Communication   Communication: HOH  Cognition Arousal/Alertness: Lethargic   Overall Cognitive Status: Difficult to assess Area of Impairment: Attention;Following commands;Problem solving                   Current Attention Level: Focused   Following Commands: Follows one step commands with increased time (and repetition needed)     Problem Solving: Decreased initiation;Requires verbal cues;Requires tactile cues General Comments: Follows motor command with RUE/LE, gives a thumbs up, squeezes hand, touches nose and head. Responds to noxious stimulus LUE/LLE  withdrawing from pain. Right gaze preference.      General Comments General comments (skin integrity, edema, etc.): Son present during session. Pt on supplemental 02 and trying to cough but having difficulty. Can hear gargles in chest/throat. VSS. Pt in A-fib.    Exercises     Assessment/Plan    PT Assessment Patient needs continued PT services  PT Problem List Decreased strength;Decreased mobility;Impaired tone;Decreased range of motion;Decreased coordination;Decreased activity tolerance;Decreased cognition;Cardiopulmonary status limiting activity;Decreased balance;Impaired sensation       PT Treatment Interventions Therapeutic activities;Gait training;Therapeutic exercise;Patient/family education;Balance training;Neuromuscular re-education;Functional mobility training;Wheelchair mobility training    PT Goals (Current goals can be found in the Care Plan section)  Acute Rehab PT Goals Patient Stated Goal: per son, to go to SNF at [facility] PT Goal Formulation: With family Time For Goal Achievement: 03/28/20 Potential to Achieve Goals: Fair    Frequency Min 3X/week   Barriers to discharge Decreased caregiver support      Co-evaluation PT/OT/SLP Co-Evaluation/Treatment: Yes Reason for Co-Treatment: Complexity of the patient's impairments (multi-system involvement);Necessary to address cognition/behavior during functional activity;For patient/therapist safety PT goals addressed during session: Mobility/safety with mobility;Strengthening/ROM         AM-PAC PT "6 Clicks" Mobility  Outcome Measure Help needed turning from your back to your side while in a flat bed without using bedrails?: Total Help needed moving from lying on your back to sitting on the side of a flat bed without using bedrails?: Total Help needed moving to and from a bed to a chair (including a wheelchair)?: Total Help needed standing up from a chair using your arms (e.g., wheelchair or bedside chair)?:  Total Help needed to walk in hospital room?: Total Help needed climbing 3-5 steps with a railing? : Total 6 Click Score: 6    End of Session Equipment Utilized During Treatment: Oxygen Activity Tolerance: Patient limited by lethargy Patient left: in bed;with call bell/phone within reach;with bed alarm set;with SCD's reapplied;with family/visitor present Nurse Communication: Mobility status;Need for lift equipment PT Visit Diagnosis: Hemiplegia and hemiparesis Hemiplegia - Right/Left: Left Hemiplegia - dominant/non-dominant: Non-dominant Hemiplegia - caused by: Cerebral infarction    Time: 1207-1225 PT Time Calculation (min) (ACUTE ONLY): 18 min   Charges:   PT Evaluation $PT Eval Moderate Complexity: 1 Mod          Vale Haven, PT, DPT Acute Rehabilitation Services Pager [phone] Office [phone]      [name] A Lanier Ensign 03/14/2020, 3:27 PM

## 2020-03-14 NOTE — Evaluation (Signed)
Occupational Therapy Evaluation Patient Details Name: Jeffrey Ashley MRN: [id number] DOB: 03/26/1927 Today's Date: 03/14/2020    History of Present Illness Patient is a 86 y/o male who presents with left sided weakness. 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 NIH:18. s/p tpA and complete revascularization right MCA 12/11. Remained intubated due to concerns for aspiration. Extubated 12/14. PMH includes HTN and Dysrhythmia.   Clinical Impression   This 86 y/o male presents with the above. PTA pt living at [facility], completing ADL and mobility tasks with mod independence using RW. Bed level eval completed today (as pt just extubated this AM). Pt overall following basic motor commands using RUE/RLE, no active movement noted to LUE/LLE but does withdraw to noxious stimuli. Pt able to engage in simple grooming ADL with modA using RUE and cues to attend to L side of face. He will benefit from continued acute OT services and currently recommend SNF level therapies at time of discharge to maximize his overall safety and independence with ADL and mobility (son with preference for SNF at [facility]).     Follow Up Recommendations  SNF;Supervision/Assistance - 24 hour    Equipment Recommendations  Other (comment) (TBD)           Precautions / Restrictions Precautions Precautions: Fall Precaution Comments: left hemi; left neglect Restrictions Weight Bearing Restrictions: No      Mobility Bed Mobility               General bed mobility comments: Deferred per RN due to just being extubated and likely one way extubation.                     ADL either performed or assessed with clinical judgement   ADL Overall ADL's : Needs assistance/impaired Eating/Feeding: NPO   Grooming: Moderate assistance;Bed level;Wash/dry face                                 General ADL Comments: bed level eval today, able to engaged in basic grooming ADL, likely to require  max-totalA for additional ADL tasks     Vision   Additional Comments: pt tending to maintain eyes closed - effortful to open and unable to maintain, suspect L inattention/neglect. will continue to assess vision     Perception Perception Perception Tested?:  (to be further assessed)   Praxis      Pertinent Vitals/Pain Pain Assessment: Faces Faces Pain Scale: No hurt Pain Intervention(s): Monitored during session     Hand Dominance Right   Extremity/Trunk Assessment Upper Extremity Assessment Upper Extremity Assessment: Generalized weakness;LUE deficits/detail RUE Deficits / Details: Able to touch top of head, touch nose, squeeze hand and move RUE well to command. LUE Deficits / Details: Flaccid. Withdraws to noxious stimulus LUE Sensation: decreased light touch;decreased proprioception LUE Coordination: decreased fine motor;decreased gross motor   Lower Extremity Assessment Lower Extremity Assessment: Defer to PT evaluation LLE Deficits / Details: Flaccidity noted. Withdraws to noxious stimulus   Cervical / Trunk Assessment Cervical / Trunk Assessment: Other exceptions Cervical / Trunk Exceptions: Favors right cervical rotation.   Communication Communication Communication: HOH   Cognition Arousal/Alertness: Lethargic   Overall Cognitive Status: Difficult to assess Area of Impairment: Attention;Following commands;Problem solving                   Current Attention Level: Focused   Following Commands: Follows one step commands with increased time (  and repetition needed)     Problem Solving: Decreased initiation;Requires verbal cues;Requires tactile cues General Comments: Follows motor command with RUE/LE, gives a thumbs up, squeezes hand, touches nose and head. Responds to noxious stimulus LUE/LLE withdrawing from pain. Right gaze preference.   General Comments  Son present during session. Pt on supplemental 02 and trying to cough but having difficulty. Can  hear gargles in chest/throat. VSS. Pt in A-fib.    Exercises     Shoulder Instructions      Home Living Family/patient expects to be discharged to:: Skilled nursing facility                             Home Equipment: Walker - 2 wheels          Prior Functioning/Environment Level of Independence: Independent with assistive device(s)        Comments: Lives at [facility]. Pt has been doing 1 mile per day with RW, doing own ADLs. Has been walking to dining hall for 3 meals per day. Son, present provided PLOF/histiory as pt not able to talk yet due to just being extubated and lethargic.        OT Problem List: Decreased strength;Decreased range of motion;Decreased activity tolerance;Impaired balance (sitting and/or standing);Decreased cognition;Decreased safety awareness;Decreased knowledge of use of DME or AE;Impaired UE functional use;Cardiopulmonary status limiting activity;Decreased coordination;Impaired vision/perception      OT Treatment/Interventions: Self-care/ADL training;DME and/or AE instruction;Manual therapy;Therapeutic activities;Cognitive remediation/compensation;Patient/family education;Balance training;Visual/perceptual remediation/compensation;Splinting;Energy conservation;Neuromuscular education;Therapeutic exercise    OT Goals(Current goals can be found in the care plan section) Acute Rehab OT Goals Patient Stated Goal: per son, to go to SNF at [facility] OT Goal Formulation: With patient/family Time For Goal Achievement: 03/28/20 Potential to Achieve Goals: Good  OT Frequency: Min 2X/week   Barriers to D/C:            Co-evaluation PT/OT/SLP Co-Evaluation/Treatment: Yes Reason for Co-Treatment: Complexity of the patient's impairments (multi-system involvement);For patient/therapist safety;To address functional/ADL transfers PT goals addressed during session: Mobility/safety with mobility;Strengthening/ROM OT goals addressed during  session: Strengthening/ROM;ADL's and self-care      AM-PAC OT "6 Clicks" Daily Activity     Outcome Measure Help from another person eating meals?: Total Help from another person taking care of personal grooming?: A Lot Help from another person toileting, which includes using toliet, bedpan, or urinal?: Total Help from another person bathing (including washing, rinsing, drying)?: Total Help from another person to put on and taking off regular upper body clothing?: Total Help from another person to put on and taking off regular lower body clothing?: Total 6 Click Score: 7   End of Session Equipment Utilized During Treatment: Oxygen Nurse Communication: Mobility status  Activity Tolerance: Patient tolerated treatment well Patient left: in bed;with call bell/phone within reach;with family/visitor present  OT Visit Diagnosis: Other abnormalities of gait and mobility (R26.89);Muscle weakness (generalized) (M62.81);Hemiplegia and hemiparesis Hemiplegia - Right/Left: Left Hemiplegia - dominant/non-dominant: Non-Dominant                Time: 1610-9604 OT Time Calculation (min): 17 min Charges:  OT General Charges $OT Visit: 1 Visit OT Evaluation $OT Eval Moderate Complexity: 1 Mod  Marcy Siren, OT Acute Rehabilitation Services Pager [phone] Office [phone]   Orlando Penner 03/14/2020, 5:46 PM

## 2020-03-14 NOTE — Procedures (Signed)
Extubation Procedure Note  Patient Details:   Name: Monico Sudduth DOB: Mar 29, 1927 MRN: [id number]   Airway Documentation:    Vent end date: 03/14/20 Vent end time: 1100   Evaluation  O2 sats: stable throughout Complications: No apparent complications Patient did tolerate procedure well. Bilateral Breath Sounds: Clear,Diminished   No   Positive cuff leak noted. Patient placed on [place] 4 L with humidity. No stridor noted.  Family at bedside.  Rayburn Felt 03/14/2020, 12:32 PM

## 2020-03-14 NOTE — Progress Notes (Signed)
STROKE TEAM PROGRESS NOTE   INTERVAL HISTORY Son and RN are at bedside.  Patient still intubated, however, tolerating weaning well this morning, plan to extubate today.  Patient lethargic, but eyes open to voice, able to follow simple commands on the right hand.  BP 160/100, will increase Coreg dose.  OBJECTIVE Vitals:   03/14/20 1200 03/14/20 1300 03/14/20 1400 03/14/20 1500  BP: (!) 149/93 (!) 156/98 (!) 146/95 (!) 164/102  Pulse: 80 81 77 74  Resp: 16 13 15 14   Temp: 97.6 F (36.4 C)     TempSrc: Axillary     SpO2: 98% 98% 93% 98%  Weight:      Height:       CBC:  Recent Labs  Lab 03-18-20 0933 18-Mar-2020 1452 03/12/20 0054 03/13/20 0654 03/13/20 1629 03/14/20 0609 03/14/20 0954  WBC 6.0  --  9.1   < > 9.3 8.3  --   NEUTROABS 3.7  --  8.1*  --   --   --   --   HGB 13.0   < > 11.6*   < > 11.1* 10.9* 10.2*  HCT 41.2   < > 35.3*   < > 34.9* 34.6* 30.0*  MCV 93.6  --  91.0   < > 93.6 94.0  --   PLT 173  --  150   < > PLATELET CLUMPS NOTED ON SMEAR, COUNT APPEARS DECREASED 132*  --    < > = values in this interval not displayed.   Basic Metabolic Panel:  Recent Labs  Lab 03/13/20 0654 03/13/20 1216 03/13/20 1629 03/14/20 0609 03/14/20 0954  NA 142  --   --  145 145  K 3.5  --   --  3.5 4.0  CL 112*  --   --  114*  --   CO2 19*  --   --  20*  --   GLUCOSE 120*  --   --  130*  --   BUN 24*  --   --  25*  --   CREATININE 1.25*  --   --  1.01  --   CALCIUM 8.3*  --   --  8.5*  --   MG  --    < > 2.2 2.2  --   PHOS  --    < > 3.2 3.0  --    < > = values in this interval not displayed.   Lipid Panel:     Component Value Date/Time   CHOL 123 03/12/2020 0054   TRIG 102 03/14/2020 0609   HDL 34 (L) 03/12/2020 0054   CHOLHDL 3.6 03/12/2020 0054   VLDL 18 03/12/2020 0054   LDLCALC 71 03/12/2020 0054   HgbA1c:  Lab Results  Component Value Date   HGBA1C 5.8 (H) 03/12/2020   Urine Drug Screen:     Component Value Date/Time   LABOPIA NEGATIVE 03/13/2012 1900    COCAINSCRNUR NEGATIVE 03/13/2012 1900   LABBENZ NEGATIVE 03/13/2012 1900   AMPHETMU NEGATIVE 03/13/2012 1900   THCU NEGATIVE 03/13/2012 1900   LABBARB NEGATIVE 03/13/2012 1900    Alcohol Level No results found for: ETH  IMAGING  CT Code Stroke CTA Head W/WO contrast CT Code Stroke CTA Neck W/WO contrast 03-18-20   Right M1 segment large vessel occlusion. No large vessel occlusion within the neck. 50% right and 30% left proximal ICA luminal narrowing. Mild-to-moderate left vertebral artery origin narrowing. Mild narrowing of the bilateral V4 segments, right P3 and left P2 segments.  CT HEAD CODE STROKE WO CONTRAST 03/08/2020 1. No acute intracranial process. Mild cerebral atrophy and chronic microvascular ischemic changes.  2. ASPECTS is 10  3. Remote lacunar insult versus dilated perivascular spaces involving the right basal ganglia, posterior limb of the right internal capsule and left insula.   MR BRAIN WO CONTRAST MR ANGIO HEAD WO CONTRAST 03/12/2020 Sequela of acute/subacute right MCA territory infarct. Small acute right cerebellar insult. Interval revascularization of the right MCA. No high-grade intracranial narrowing or large vessel occlusion. Mild narrowing of the right V4, right P2 and left M2 segments.   DG Chest Port 1 View 03/12/2020 Increased bibasilar opacities. Slight interval retraction of ETT.  03/23/2020 1. Endotracheal tube distal tip in the midthoracic trachea.  2. Bibasilar subsegmental atelectasis.   DG Abd Portable 1V 03/13/2020 Orogastric tube tip and side port in distal stomach. No bowel obstruction or free air evident. Apparent staghorn calculi in left kidney, stable. Small pleural effusions bilaterally with bibasilar atelectasis.  03/16/2020 Enteric tube with tip in the distal esophagus, side-port in the mid esophagus. Recommend advancement of greater than 10 cm for optimal placement.   ECHOCARDIOGRAM COMPLETE 03/02/2020 1. Left ventricular  ejection fraction, by estimation, is 50 to 55%. The left ventricle has low normal function. The left ventricle has no regional wall motion abnormalities. There is severe concentric left ventricular hypertrophy. Left ventricular diastolic parameters are indeterminate.   2. Right ventricular systolic function is normal. The right ventricular size is normal.   3. Left atrial size was severely dilated.   4. Right atrial size was severely dilated.   5. A small pericardial effusion is present. The pericardial effusion is surrounding the apex.   6. The mitral valve is grossly normal. Trivial mitral valve regurgitation.   7. The aortic valve is tricuspid. There is mild calcification of the aortic valve. Aortic valve regurgitation is mild. No aortic stenosis is present.   8. The inferior vena cava is normal in size with greater than 50% respiratory variability, suggesting right atrial pressure of 3 mmHg. Comparison(s): No prior Echocardiogram.   ECG - atrial fibrillation - ventricular response 81 BPM (See cardiology reading for complete details)   PHYSICAL EXAM  Temp:  [97.6 F (36.4 C)-98.5 F (36.9 C)] 97.6 F (36.4 C) (12/14 1200) Pulse Rate:  [70-95] 74 (12/14 1500) Resp:  [11-29] 14 (12/14 1500) BP: (129-189)/(79-165) 164/102 (12/14 1500) SpO2:  [92 %-100 %] 98 % (12/14 1500) FiO2 (%):  [40 %] 40 % (12/14 0819) Weight:  [80.1 kg] 80.1 kg (12/14 0500)  General - Well nourished, well developed, intubated off sedation.  Ophthalmologic - fundi not visualized due to noncooperation.  Cardiovascular - irregularly irregular heart rate and rhythm.  Neuro - intubated off propofol, eyes open with voice and was able to keep open, able to follow simple midline and right hand commands. With eye opening, eyes in right gaze preference position, barely cross midline, not blinking to visual threat on the left, inconsistently blinking on the right, pupils 61mm bilaterally reactive to light. Corneal reflex  present bilaterally, gag and cough present. Breathing over the vent.  Facial symmetry not able to test due to ET tube.  Tongue protrusion not cooperative. RUE spontaneous movement able to against gravity, able to follow commands on the right hand. RLE proximal 2/5 and knee flexion 3+/5 on pain, LUE flaccid, LLE mild withdraw movement of the knee. DTR 1+ and no babinski. Sensation, coordination not cooperative and gait not tested.   ASSESSMENT/PLAN Mr. Torrell  Jamire Shabazz is a 86 y.o. male with history of hypertension, hypothyroidism, hearing impaired, subdural hematoma in 2013 and atrial fibrillation not on anticoagulation who presents with dysarthria and left-sided weakness. The pt vomited en route and there was concern for possible aspiration.  The patient received IV t-PA Saturday 08-Apr-2020 at 9:45 AM. IR 08-Apr-2020 at 11:40 AM - S/P complete revascularization of RT MCA M1 occlusion achieving a TICI 3 revascularization.  Stroke: Rt MCA and small R cerebellar infarcts - embolic - likely from atrial fibrillation without anticoagulation  CT Head -  No acute intracranial process. Remote lacunar insult versus dilated perivascular spaces involving the right basal ganglia, posterior limb of the right internal capsule and left insula.   CTA H&N - Right M1 segment large vessel occlusion. No large vessel occlusion within the neck. 50% right and 30% left proximal ICA luminal narrowing. Mild-to-moderate left vertebral artery origin narrowing. Mild narrowing of the bilateral V4 segments, right P3 and left P2 segments.  MRI / MRA head - R large extensive MCA infarct. Small R cerebellar infarct. Interval revascularization R MCA. Mild narrowing R V4, R P2, L M2  2D Echo - EF 50 - 55%. No cardiac source of emboli identified.   [employer] Virus 2 - negative  LDL - 71  HgbA1c - 5.8  VTE prophylaxis - Heparin 5000 units sq tid   aspirin 81 mg daily prior to admission, now on aspirin 325 mg daily   Therapy  recommendations: SNF  Disposition:  Pending  Afib  Chronic afib but not sure about afib burden   Was on coumadin in the past, off after SDH  Never restarted due to advanced age, spontaneous SDH, gross hematuria  Follow with Dr. Marge Duncans at [facility]  Currently rate controlled  Now on ASA  Respiratory failure  Intubated on vent  Off sedation  CCM on board  Mental status much improved  Tolerating weaning  Plan to extubate today  Aspiration pneumonia  Vomited x2 before intubation  T Max - 100.4->afebrile  IV Unasyn started Apr 08, 2020  WBC's - 6.0->9.1->10.3  Respiratory cultures - normal respiratory flora.  CCM on board  Hypertension  Home BP meds: Coreg  Current BP meds: Resume Coreg   Stable . BP goal post tPA < 180/105  . Long-term BP goal normotensive  Hyperlipidemia  Home Lipid lowering medication: none   LDL 71, goal < 70  Will decide on statin after GOC discussion  Dysphagia  N.p.o.  Not able to place cortrak  Still has OG for TF at this time  Speech to follow  Other Stroke Risk Factors  Advanced age  Previous ETOH use  Hx of R SDH 2013 s/p craniotomy  Possible remote lacunar by imaging  Other Active Problems, Findings and Recommendations  Code status - Limited code CKD - stage 3a - creatinine - 1.26->1.21->1.25->1.01 on Vit D Hypothyroid on synthroid On Xalatan   Hypomagnesemia Mag 1.6 supplement -> 2.2 resolved  Hematuria - catheter related trauma - monitoring  NGT placement related traumatic GIB - resolving  Hospital day # 3  This patient is critically ill due to large right MCA infarct, A. fib not on AC, respiratory failure needing intubation, aspiration pneumonia hypertensive urgency, dysphagia and at significant risk of neurological worsening, death form cerebral edema, brain herniation, recurrent stroke, hemorrhagic conversion, heart failure, seizure, sepsis. This patient's care requires constant  monitoring of vital signs, hemodynamics, respiratory and cardiac monitoring, review of multiple databases, neurological assessment, discussion with family, other  specialists and medical decision making of high complexity. I spent 40 minutes of neurocritical care time in the care of this patient. I had long discussion with son at bedside, updated pt current condition, treatment plan and potential prognosis, and answered all the questions. He expressed understanding and appreciation.   Marvel PlanJindong Tavaria Mackins, MD PhD Stroke Neurology 03/14/2020 8:56 PM    To contact Stroke Continuity provider, please refer to [email or web address]. After hours, contact General Neurology

## 2020-03-14 NOTE — Progress Notes (Addendum)
NAME:  Jeffrey SchleinRobert Ashley Ashley, MRN:  [id number], DOB:  06/11/1926, LOS: 3 ADMISSION DATE:  03/15/2020, CONSULTATION DATE:  12-11 REFERRING MD:  Roda ShuttersXu, CHIEF COMPLAINT:  Resp failure   Brief History   JeffreyAshley is 86 yo M w/ dementia presenting w/ acute left sided weakness on code stroke. Received IV TPA and right MCA M1 re-vascularized by neuro ir. Remained intubated due to suspected aspiration.   MRI on 12/12 post-intervention showing large right MCA infarct  Past Medical History   Past Medical History:  Diagnosis Date  . 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 Dysrhythmia    AFIB  . HOH (hard of hearing)   . Hypertension   . Hypothyroidism   . Nontraumatic subdural hematoma (HCC)   . 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 No acute intracranial process. Mild cerebral atrophy and chronic microvascular ischemic changes. 2. ASPECTS is 10 3. Remote lacunar insult versus dilated perivascular spaces involving the right basal ganglia, posterior limb of the right internal capsule and left insula.  MRI head 12/12 Brain: Sequela of acute/subacute right MCA territory insult with petechial hemorrhages. Tiny right cerebellar acute infarct. No midline shift, ventriculomegaly or extra-axial fluid collection. No mass lesion. Mild cerebral atrophy with ex vacuo dilatation. Chronic microvascular ischemic changes. Chronic lacunar insults involving the right caudate, posterior limb of the right internal capsule and left insula. Remote right cerebellar insult.   Micro Data:  12/11 Resp culture neg  Antimicrobials:  12/11 Unasyn >>   Interim history/subjective:   O/N event: Episode of gross hematuria that self resolved  Examined and evaluated at bedside. Appear agitated. Withdraw from pain. Does not follow directions.  Objective   Blood pressure (!) 164/103, pulse 74, temperature 98.5 F (36.9 C), temperature source Axillary, resp. rate 16, height 6' (1.829 m), weight 80.1 kg, SpO2 100 %.    Vent Mode: PRVC FiO2 (%):  [40 %] 40 % Set Rate:  [16 bmp] 16 bmp Vt Set:  [620 mL] 620 mL PEEP:  [5 cmH20] 5 cmH20 Pressure Support:  [5 cmH20] 5 cmH20 Plateau Pressure:  [16 cmH20-17 cmH20] 17 cmH20   Intake/Output Summary (Last 24 hours) at 03/14/2020 0701 Last data filed at 03/14/2020 0600 Gross per 24 hour  Intake 1451.98 ml  Output 675 ml  Net 776.98 ml   Filed Weights   03/16/2020 0900 03/25/2020 0922 03/14/20 0500  Weight: 83 kg 83 kg 80.1 kg   Examination: Gen: Well-developed, chronically ill-appearing, agitated HEENT: NCAT head, hearing intact, intubated Pulm: CTAB, no rales, no wheezes Extm: ROM intact, No peripheral edema Skin: Dry, Warm, normal turgor Neuro: L sided weakness, does not follow directions, withdraws to pain  Resolved Hospital Problem list     Assessment & Plan:   Postop respiratory failure secondary to code stroke and suspected aspiration. Continued encephalopathy and apneic on SBTs yesterday. X-ray showing bibasilar opacities. Afebrile overnight. Culture negative. -Continue ventilator support -Received 3 day course of Unasyn. Ok to d/c with negative resp cultures. -Weaning as tolerated  Right MCA CVA status post TPA and  thrombectomy  - Appreciate neuro recs -  Hypertension Bp 164/103 this am.  - C/w  labetalol PRN   Gross Hematuria Episode of gross hematuria. Likely due to foley associated trauma. No further gross hematuria overnight. Continues to make urine at 300cc overnight. Hgb stable at 11.1->10.9 - Bladder scans  - Trend cbc  History of atrial fibrillation CHADSVASC2 score of 6. Had Coumadin in  past but had subdural hemorrhage hx on Coumadin. - Telemetry - C/w carvedilol  Best practice (evaluated daily)   Diet: npo Pain/Anxiety/Delirium protocol (if indicated): N/A VAP protocol (if indicated):  DVT prophylaxis: subqhep GI prophylaxis:PPI Glucose control: N/A Mobility: BR last date of multidisciplinary goals of care discussion  Family and staff present  N Summary of discussion Will update Follow up goals of care discussion due 03/14/20 Code Status: Partial (no cpr, no shock) Disposition: icu  Labs   CBC: Recent Labs  Lab 03/01/2020 0933 03/23/2020 1452 03/12/20 0054 03/13/20 0654 03/13/20 1629  WBC 6.0  --  9.1 10.3 9.3  NEUTROABS 3.7  --  8.1*  --   --   HGB 13.0 11.2* 11.6* 11.4* 11.1*  HCT 41.2 33.0* 35.3* 35.0* 34.9*  MCV 93.6  --  91.0 92.1 93.6  PLT 173  --  150 146* PLATELET CLUMPS NOTED ON SMEAR, COUNT APPEARS DECREASED    Basic Metabolic Panel: Recent Labs  Lab 03/25/2020 0928 03/21/2020 0933 03/18/2020 1452 03/12/20 0054 03/13/20 0654 03/13/20 1216 03/13/20 1629  NA 139 136 140 137 142  --   --   K 4.1 4.2 3.9 3.9 3.5  --   --   CL 106 105  --  109 112*  --   --   CO2  --  22  --  17* 19*  --   --   GLUCOSE 143* 149*  --  155* 120*  --   --   BUN 24* 23  --  24* 24*  --   --   CREATININE 1.10 1.26*  --  1.21 1.25*  --   --   CALCIUM  --  8.7*  --  8.2* 8.3*  --   --   MG  --   --   --  1.6*  --  2.4 2.2  PHOS  --   --   --  2.6  --  3.1 3.2   GFR: Estimated Creatinine Clearance: 42.2 mL/min (A) (by C-G formula based on SCr of 1.25 mg/dL (H)). Recent Labs  Lab 03/15/2020 0933 03/12/20 0054 03/13/20 0654 03/13/20 1629  WBC 6.0 9.1 10.3 9.3    Liver Function Tests: Recent Labs  Lab 03/05/2020 0933  AST 18  ALT 16  ALKPHOS 82  BILITOT 0.9  PROT 5.9*  ALBUMIN 3.4*   No results for input(s): LIPASE, AMYLASE in the last 168 hours. No results for input(s): AMMONIA in the last 168 hours.  ABG    Component Value Date/Time   PHART 7.449  03/18/2020 1452   PCO2ART 28.1 (L) 03/01/2020 1452   PO2ART 243 (H) 03/01/2020 1452   HCO3 20.2 03/19/2020 1452   TCO2 21 (L) 03/17/2020 1452   ACIDBASEDEF 4.0 (H) 03/10/2020 1452   O2SAT 100.0 03/22/2020 1452     Coagulation Profile: Recent Labs  Lab 03/23/2020 0933  INR 1.1    Cardiac Enzymes: No results for input(s): CKTOTAL, CKMB, CKMBINDEX, TROPONINI in the last 168 hours.  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   Surgical History    Past Surgical History:  Procedure Laterality Date  . APPENDECTOMY    . BRAIN SURGERY     CRANIO  . CATARACT EXTRACTION W/PHACO Left 03/06/2017   Procedure: CATARACT EXTRACTION PHACO AND INTRAOCULAR LENS PLACEMENT (IOC);  Surgeon: Nevada Crane, MD;  Location: [facility];  Service: Ophthalmology;  Laterality: Left;  [place] 01:10.8 AP% 18.8 CDE 13.39 FLUID PACK LOT # [id number]  . CATARACT EXTRACTION W/PHACO Right 08/16/2019   Procedure: CATARACT EXTRACTION PHACO AND INTRAOCULAR LENS PLACEMENT (IOC) RIGHT ISTENT INJ 8.84  01:03.5;  Surgeon: Nevada Crane, MD;  Location: [facility];  Service: Ophthalmology;  Laterality: Right;  . CHOLECYSTECTOMY    . INTRAMEDULLARY (IM) NAIL INTERTROCHANTERIC Left 08/18/2019   Procedure: INTRAMEDULLARY (IM) NAIL INTERTROCHANTRIC;  Surgeon: Lyndle Herrlich, MD;  Location: [facility] ORS;  Service:  Orthopedics;  Laterality: Left;  . Kidney Tumor Removal    . RADIOLOGY WITH ANESTHESIA N/A 03/02/2020   Procedure: IR WITH ANESTHESIA;  Surgeon: Radiologist, Medication, MD;  Location: [facility] OR;  Service: Radiology;  Laterality: N/A;     Social History   reports that he has never smoked. He has never used smokeless tobacco. He reports previous alcohol use. He reports that he does not use drugs.   Family History   His family history includes Colon cancer in his sister.   Allergies Allergies  Allergen Reactions  . Brimonidine Tartrate Other (See Comments)    Probably an "adverse effect" (moreso) than a true "allergy"- per family  . Brimonidine Tartrate-Timolol Other (See Comments)    Probably an "adverse effect" (moreso) than a true "allergy"- per family    Home Medications  Prior to Admission medications   Medication Sig Start Date End Date Taking? Authorizing Provider  aspirin 81 MG EC tablet Take 81 mg by mouth daily. 03/22/19  Yes [provider]  carvedilol (COREG) 25 MG tablet Take 1 tablet (25 mg total) by mouth 2 (two) times daily. 08/20/19 09/19/19 Yes Charise Killian, MD  Cholecalciferol (VITAMIN D-3) 5000 units TABS Take 5,000 Units by mouth daily.   Yes [provider]  finasteride (PROSCAR) 5 MG tablet Take 1 tablet (5 mg total) by mouth daily. 01/30/20  Yes Stoioff, Verna Czech, MD  latanoprost (XALATAN) 0.005 % ophthalmic solution Apply 1 drop to eye at bedtime.   Yes [provider]  levothyroxine (SYNTHROID, LEVOTHROID) 50 MCG tablet Take 50 mcg by mouth daily before breakfast. 02/11/17  Yes [provider]  Multiple Vitamins-Minerals (PRESERVISION AREDS 2 PO) Take 1 capsule by mouth 2 (two) times daily.   Yes [provider]  timolol (TIMOPTIC) 0.5 % ophthalmic solution Place 1 drop into both eyes at bedtime.   Yes [provider]  vitamin B-12 (CYANOCOBALAMIN) 500 MCG tablet Take 500 mcg by mouth See admin instructions.  Take 500 mcg by mouth on Tuesday and Friday   Yes [provider]  Theotis Barrio, MD 03/14/2020, 7:55 AM PGY-3, [facility] Pager: [phone]  Critical care time:

## 2020-03-15 ENCOUNTER — Inpatient Hospital Stay (HOSPITAL_COMMUNITY): Payer: Medicare Other

## 2020-03-15 DIAGNOSIS — J9601 Acute respiratory failure with hypoxia: Secondary | ICD-10-CM

## 2020-03-15 LAB — BASIC METABOLIC PANEL
Anion gap: 9 (ref 5–15)
BUN: 24 mg/dL — ABNORMAL HIGH (ref 8–23)
CO2: 22 mmol/L (ref 22–32)
Calcium: 8.6 mg/dL — ABNORMAL LOW (ref 8.9–10.3)
Chloride: 115 mmol/L — ABNORMAL HIGH (ref 98–111)
Creatinine, Ser: 1.01 mg/dL (ref 0.61–1.24)
GFR, Estimated: >60 mL/min (ref >60)
Glucose, Bld: 97 mg/dL (ref 70–99)
Potassium: 4.1 mmol/L (ref 3.5–5.1)
Sodium: 146 mmol/L — ABNORMAL HIGH (ref 135–145)

## 2020-03-15 LAB — GLUCOSE, CAPILLARY
Glucose-Capillary: 81 mg/dL (ref 70–99)
Glucose-Capillary: 86 mg/dL (ref 70–99)
Glucose-Capillary: 78 mg/dL (ref 70–99)
Glucose-Capillary: 99 mg/dL (ref 70–99)
Glucose-Capillary: 154 mg/dL — ABNORMAL HIGH (ref 70–99)
Glucose-Capillary: 147 mg/dL — ABNORMAL HIGH (ref 70–99)

## 2020-03-15 LAB — CBC
HCT: 33.6 % — ABNORMAL LOW (ref 39.0–52.0)
Hemoglobin: 11.1 g/dL — ABNORMAL LOW (ref 13.0–17.0)
MCH: 31.2 pg (ref 26.0–34.0)
MCHC: 33 g/dL (ref 30.0–36.0)
MCV: 94.4 fL (ref 80.0–100.0)
Platelets: 133 10*3/uL — ABNORMAL LOW (ref 150–400)
RBC: 3.56 MIL/uL — ABNORMAL LOW (ref 4.22–5.81)
RDW: 14.3 % (ref 11.5–15.5)
WBC: 8.1 10*3/uL (ref 4.0–10.5)
nRBC: 0 % (ref 0.0–0.2)

## 2020-03-15 LAB — PHOSPHORUS: Phosphorus: 3.6 mg/dL (ref 2.5–4.6)

## 2020-03-15 LAB — MAGNESIUM: Magnesium: 2 mg/dL (ref 1.7–2.4)

## 2020-03-15 MED ORDER — PROSOURCE TF PO LIQD
45.0000 mL | Freq: Three times a day (TID) | ORAL | Status: DC
  Administered 2020-03-15 – 2020-03-23 (×23): 45 mL
  Filled 2020-03-15 (×22): qty 45

## 2020-03-15 MED ORDER — ATORVASTATIN CALCIUM 10 MG PO TABS
20.0000 mg | ORAL_TABLET | Freq: Every day | ORAL | Status: DC
  Administered 2020-03-15 – 2020-03-23 (×8): 20 mg
  Filled 2020-03-15 (×8): qty 2

## 2020-03-15 MED ORDER — OSMOLITE 1.5 CAL PO LIQD
1000.0000 mL | ORAL | Status: DC
  Administered 2020-03-15 – 2020-03-22 (×4): 1000 mL
  Filled 2020-03-15 (×3): qty 1000

## 2020-03-15 MED ORDER — FREE WATER
200.0000 mL | Freq: Four times a day (QID) | Status: DC
  Administered 2020-03-15 – 2020-03-19 (×15): 200 mL

## 2020-03-15 NOTE — Procedures (Signed)
Cortrak  Person Inserting Tube:  Valdez Brannan C, RD Tube Type:  Cortrak - 43 inches Tube Location:  Left nare Initial Placement:  Stomach Secured by: Bridle Technique Used to Measure Tube Placement:  Documented cm marking at nare/ corner of mouth Cortrak Secured At:  70 cm    Cortrak Tube Team Note:  Consult received to place a Cortrak feeding tube.   No x-ray is required. RN may begin using tube.   If the tube becomes dislodged please keep the tube and contact the Cortrak team at [email or web address] ([email or web address]) for replacement.  If after hours and replacement cannot be delayed, place a NG tube and confirm placement with an abdominal x-ray.    Ronny Ruddell P., RD, LDN, CNSC See AMiON for contact information    

## 2020-03-15 NOTE — Progress Notes (Signed)
Nutrition Follow-up  DOCUMENTATION CODES:   Severe malnutrition in context of chronic illness  INTERVENTION:   Tube feeding via Cortrak: - Osmolite 1.5 @ 60 ml/hr (1440 ml/day) - ProSource TF 45 ml TID - Free water flushes per MD, currently 200 ml q 6 hours  Tube feeding regimen provides 2280 kcal, 123 grams of protein, and 1097 ml of H2O.  Total free water with flushes: 1898  NUTRITION DIAGNOSIS:   Severe Malnutrition related to chronic illness as evidenced by severe fat depletion,severe muscle depletion.  Ongoing  GOAL:   Patient will meet greater than or equal to 90% of their needs  Met via TF  MONITOR:   Vent status,TF tolerance,I & O's  REASON FOR ASSESSMENT:   Consult,Ventilator Enteral/tube feeding initiation and management  ASSESSMENT:   Pt with PMH of HTN, hypothyroidism, and is HOH admitted with large R MCA stroke s/p tPA and thrombectomy.  12/14 - extubated 12/15 - Cortrak placed, tip gastric  Spoke with pt's son at bedside. Discussed plan for restarting tube feeds via Cortrak with pt's son and Therapist, sports. Orders adjusted to reflect re-estimated needs. MD ordered free water flushes for hypernatremia.  Medications reviewed and include: cholecalciferol, protonix IVF: NS @ 25 ml/hr  Labs reviewed: sodium 146, BUN 24 CBG's: 81-98 x 24 hours  UOP: 1300 ml x 24 hours I/O's: +2.9 L since admit  Diet Order:   Diet Order            Diet NPO time specified  Diet effective now                 EDUCATION NEEDS:   No education needs have been identified at this time  Skin:  Skin Assessment: Reviewed RN Assessment  Last BM:  03/10/2020  Height:   Ht Readings from Last 1 Encounters:  03/09/2020 6' (1.829 m)    Weight:   Wt Readings from Last 1 Encounters:  03/15/20 80 kg    Ideal Body Weight:  80.9 kg  BMI:  Body mass index is 23.92 kg/m.  Estimated Nutritional Needs:   Kcal:  2100-2300  Protein:  115-135 grams  Fluid:  >/= 2.0  L    Gustavus Bryant, MS, RD, LDN Inpatient Clinical Dietitian Please see AMiON for contact information.

## 2020-03-15 NOTE — Progress Notes (Signed)
Discussed plan to transfer out of ICU with son at bedside. All questions and concerns addressed.

## 2020-03-15 NOTE — Progress Notes (Signed)
NAME:  Jeffrey SchleinRobert Ashley Ashley, MRN:  [id number], DOB:  11/30/1926, LOS: 4 ADMISSION DATE:  September 18, 2019, CONSULTATION DATE:  12-11 REFERRING MD:  Roda ShuttersXu, CHIEF COMPLAINT:  Resp failure   Brief History   Jeffrey Ashley is 86 yo M w/ dementia presenting w/ acute left sided weakness on code stroke. Received IV TPA and right MCA M1 re-vascularized by neuro ir. Remained intubated due to suspected aspiration.   MRI on 12/12 post-intervention showing large right MCA infarct  Past Medical History   Past Medical History:  Diagnosis Date  . 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 Dysrhythmia    AFIB  . HOH (hard of hearing)   . Hypertension   . Hypothyroidism   . Nontraumatic subdural hematoma (HCC)   . 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 No acute intracranial process. Mild cerebral atrophy and chronic microvascular ischemic changes. 2. ASPECTS is 10 3. Remote lacunar insult versus dilated perivascular spaces involving the right basal ganglia, posterior limb of the right internal capsule and left insula.  MRI head 12/12 Brain: Sequela of acute/subacute right MCA territory insult with petechial hemorrhages. Tiny right cerebellar acute infarct. No midline shift, ventriculomegaly or extra-axial fluid collection. No mass lesion. Mild cerebral atrophy with ex vacuo dilatation. Chronic microvascular ischemic changes. Chronic lacunar insults involving the right caudate, posterior limb of the right internal capsule and left insula. Remote right cerebellar insult.   Micro Data:  12/11 Resp culture neg  Antimicrobials:  12/11 Unasyn >>   Interim history/subjective:   O/N event: intermittent episodes of desaturation per bedside nurse. Currently satting 99 on 4L  Examined and evaluated at bedside. Observed to be pointing at foot. Unable to describe acute complaint due to dysarthria. Weak cough.   Objective   Blood pressure (!) 166/104, pulse 93, temperature 98.4 F (36.9 C), temperature source Axillary, resp. rate 16, height 6' (1.829 m), weight 80 kg, SpO2 98 %.    Vent Mode: PSV;CPAP FiO2 (%):  [40 %] 40 % PEEP:  [5 cmH20] 5 cmH20 Pressure Support:  [10 cmH20] 10 cmH20 Plateau Pressure:  [15 cmH20] 15 cmH20   Intake/Output Summary (Last 24 hours) at 03/15/2020 0705 Last data filed at 03/15/2020 0600 Gross per 24 hour  Intake 600.43 ml  Output 1300 ml  Net -699.57 ml   Filed Weights   09/08/2019 0922 03/14/20 0500 03/15/20 0500  Weight: 83 kg 80.1 kg 80 kg   Examination: Gen: Well-developed, chronically ill-appearing, agitated HEENT: NCAT head, hearing intact,  CV: RRR, S1 s2 wnl Pulm: Coarse breath sounds, no wheezes, no rales Extm: ROM intact, No peripheral edema Skin: Dry, Warm, normal turgor Neuro: L sided weakness, R facial droop  Resolved Hospital Problem list     Assessment & Plan:   Postop respiratory failure secondary to code stroke and suspected aspiration. Extubated yesterday without complications. S/p 3 days of Unasyn. On 4L 99 o2 sat this am. Afebrile overnight. Culture negative but desaturation events concerning considering neurological deficits. Thankfully no fevers or hypotension or tachypnea to suggest pneumonia but at high risk for aspiration - Aspiration precautions - X-ray this am - O2 sat as needed for to keep  o2 >90 - F/u speech and swallow eval for aspiration risk  Right MCA CVA status post TPA and  thrombectomy  - Appreciate neuro recs  Hypertension Bp 164/103 this am.  - C/w labetalol PRN  - Can start oral bp meds after cortrak placement  Gross Hematuria Episode of gross hematuria. Likely due to  foley associated trauma. No further gross hematuria overnight. Good urine output with 1.3L overnight. hgb stable 10.9->11.1 - Resolved  History of atrial fibrillation CHADSVASC2 score of 6. Had Coumadin in past but had subdural hemorrhage hx on Coumadin. - Telemetry - C/w carvedilol  Best practice (evaluated daily)   Diet: npo Pain/Anxiety/Delirium protocol (if indicated): N/A VAP protocol (if indicated):  DVT prophylaxis: subqhep GI prophylaxis:PPI Glucose control: N/A Mobility: BR last date of multidisciplinary goals of care discussion  Family and staff present  N Summary of discussion Will update Follow up goals of care discussion due 03/14/20 Code Status: Partial (no cpr, no shock) Disposition: icu  Labs   CBC: Recent Labs  Lab 03/19/20 0933 03-19-2020 1452 03/12/20 0054 03/13/20 0654 03/13/20 1629 03/14/20 0609 03/14/20 0954 03/15/20 0118  WBC 6.0  --  9.1 10.3 9.3 8.3  --  8.1  NEUTROABS 3.7  --  8.1*  --   --   --   --   --   HGB 13.0   < > 11.6* 11.4* 11.1* 10.9* 10.2* 11.1*  HCT 41.2   < > 35.3* 35.0* 34.9* 34.6* 30.0* 33.6*  MCV 93.6  --  91.0 92.1 93.6 94.0  --  94.4  PLT 173  --  150 146* PLATELET CLUMPS NOTED ON SMEAR, COUNT APPEARS DECREASED 132*  --  133*   < > = values in this interval not displayed.    Basic Metabolic Panel: Recent Labs  Lab 2020/03/19 0933 03-19-20 1452 03/12/20 0054 03/13/20 0654 03/13/20 1216 03/13/20 1629 03/14/20 0609 03/14/20 0954 03/14/20 1722 03/15/20 0118  NA 136   < > 137 142  --   --  145 145  --  146*  K 4.2   < > 3.9 3.5  --   --  3.5 4.0  --  4.1  CL 105  --  109 112*  --   --  114*  --   --  115*  CO2 22  --  17* 19*  --   --  20*  --   --  22  GLUCOSE 149*  --  155* 120*  --   --  130*  --   --  97  BUN 23  --  24* 24*  --   --  25*  --   --  24*  CREATININE 1.26*  --  1.21 1.25*  --   --  1.01  --   --  1.01  CALCIUM 8.7*  --  8.2* 8.3*  --   --  8.5*  --   --  8.6*  MG  --   --  1.6*  --  2.4 2.2 2.2   --  2.0  --   PHOS  --   --  2.6  --  3.1 3.2 3.0  --  3.5  --    < > = values in this interval not displayed.   GFR: Estimated Creatinine Clearance: 52.3 mL/min (by C-G formula based on SCr of 1.01 mg/dL). Recent Labs  Lab 03/13/20 0654 03/13/20 1629 03/14/20 0609 03/15/20 0118  WBC 10.3 9.3 8.3 8.1    Liver  Function Tests: Recent Labs  Lab 04-02-20 0933  AST 18  ALT 16  ALKPHOS 82  BILITOT 0.9  PROT 5.9*  ALBUMIN 3.4*   No results for input(s): LIPASE, AMYLASE in the last 168 hours. No results for input(s): AMMONIA in the last 168 hours.  ABG    Component Value Date/Time   PHART 7.424 03/14/2020 0954   PCO2ART 29.4 (L) 03/14/2020 0954   PO2ART 140 (H) 03/14/2020 0954   HCO3 19.3 (L) 03/14/2020 0954   TCO2 20 (L) 03/14/2020 0954   ACIDBASEDEF 4.0 (H) 03/14/2020 0954   O2SAT 99.0 03/14/2020 0954     Coagulation Profile: Recent Labs  Lab 2020/04/02 0933  INR 1.1    Cardiac Enzymes: No results for input(s): CKTOTAL, CKMB, CKMBINDEX, TROPONINI in the last 168 hours.  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   Surgical History    Past Surgical History:  Procedure Laterality Date  . APPENDECTOMY    . BRAIN SURGERY     CRANIO  . CATARACT EXTRACTION W/PHACO Left 03/06/2017   Procedure: CATARACT EXTRACTION PHACO AND INTRAOCULAR LENS PLACEMENT (IOC);  Surgeon: Nevada Crane, MD;  Location: [facility];  Service: Ophthalmology;  Laterality: Left;  [place] 01:10.8 AP% 18.8 CDE 13.39 FLUID PACK LOT # [id number]  . CATARACT EXTRACTION W/PHACO Right 08/16/2019   Procedure: CATARACT EXTRACTION PHACO AND INTRAOCULAR LENS PLACEMENT (IOC) RIGHT ISTENT INJ 8.84  01:03.5;  Surgeon: Nevada Crane, MD;  Location: [facility];  Service: Ophthalmology;  Laterality: Right;  . CHOLECYSTECTOMY    . INTRAMEDULLARY (IM) NAIL INTERTROCHANTERIC Left 08/18/2019   Procedure: INTRAMEDULLARY (IM) NAIL INTERTROCHANTRIC;  Surgeon: Lyndle Herrlich, MD;  Location: [facility];  Service: Orthopedics;  Laterality: Left;  . IR CT HEAD LTD  2020/04/02  . IR PERCUTANEOUS ART THROMBECTOMY/INFUSION INTRACRANIAL INC DIAG ANGIO  April 02, 2020  . Kidney Tumor Removal    . RADIOLOGY WITH ANESTHESIA N/A 04/02/20   Procedure: IR WITH ANESTHESIA;  Surgeon: Radiologist, Medication, MD;  Location: [facility];  Service: Radiology;  Laterality: N/A;     Social History   reports that he has never smoked. He has never used smokeless tobacco. He reports previous alcohol use. He reports that he does not use drugs.   Family History   His family history includes Colon cancer in his sister.   Allergies Allergies  Allergen Reactions  . Brimonidine Tartrate Other (See Comments)    Probably an "adverse effect" (moreso) than a true "allergy"- per family  . Brimonidine Tartrate-Timolol Other (See Comments)    Probably an "adverse effect" (moreso) than a true "allergy"- per family    Home Medications  Prior to Admission medications   Medication Sig Start Date End Date Taking? Authorizing Provider  aspirin 81 MG EC tablet Take 81 mg by mouth daily. 03/22/19  Yes [provider]  carvedilol (COREG) 25 MG tablet Take 1 tablet (25 mg total) by mouth 2 (two) times daily. 08/20/19 09/19/19 Yes Charise Killian, MD  Cholecalciferol (VITAMIN D-3) 5000 units TABS Take 5,000 Units by mouth daily.   Yes  [provider]  finasteride (PROSCAR) 5 MG tablet Take 1 tablet (5 mg total) by mouth daily. 01/30/20  Yes Stoioff, Verna Czech, MD  latanoprost (XALATAN) 0.005 % ophthalmic solution Apply 1 drop to eye at bedtime.   Yes [provider]  levothyroxine (SYNTHROID, LEVOTHROID) 50 MCG tablet Take 50 mcg by mouth daily before breakfast. 02/11/17  Yes [provider]  Multiple Vitamins-Minerals (PRESERVISION AREDS 2 PO) Take 1 capsule by mouth 2 (two) times daily.   Yes [provider]  timolol (TIMOPTIC) 0.5 % ophthalmic solution Place 1 drop into both eyes at bedtime.   Yes [provider]  vitamin B-12 (CYANOCOBALAMIN) 500 MCG tablet Take 500 mcg by mouth See admin instructions. Take 500 mcg by mouth on Tuesday and Friday   Yes [provider]   Theotis Barrio, MD 03/15/2020, 7:05 AM PGY-3, [place] [facility] Pager: [phone]  Critical care time:

## 2020-03-15 NOTE — Progress Notes (Signed)
STROKE TEAM PROGRESS NOTE   INTERVAL HISTORY Son and RN are at bedside.  Patient extubated yesterday, so far tolerating well. However, very lethargic, barely open eyes, anarthric, but following all simple commands on the right. cortrak placed today will start po meds and tube feeding. PT/OT recommend SNF back to [facility].   OBJECTIVE Vitals:   03/15/20 0200 03/15/20 0300 03/15/20 0400 03/15/20 0500  BP: (!) 150/95 (!) 147/80 (!) 132/98 (!) 157/97  Pulse: 84 77 80 84  Resp:      Temp:   98.4 F (36.9 C)   TempSrc:   Axillary   SpO2: 100% 99% 98% 99%  Weight:      Height:       CBC:  Recent Labs  Lab Mar 29, 2020 0933 March 29, 2020 1452 03/12/20 0054 03/13/20 0654 03/14/20 0609 03/14/20 0954 03/15/20 0118  WBC 6.0  --  9.1   < > 8.3  --  8.1  NEUTROABS 3.7  --  8.1*  --   --   --   --   HGB 13.0   < > 11.6*   < > 10.9* 10.2* 11.1*  HCT 41.2   < > 35.3*   < > 34.6* 30.0* 33.6*  MCV 93.6  --  91.0   < > 94.0  --  94.4  PLT 173  --  150   < > 132*  --  133*   < > = values in this interval not displayed.   Basic Metabolic Panel:  Recent Labs  Lab 03/14/20 0609 03/14/20 0954 03/14/20 1722 03/15/20 0118  NA 145 145  --  146*  K 3.5 4.0  --  4.1  CL 114*  --   --  115*  CO2 20*  --   --  22  GLUCOSE 130*  --   --  97  BUN 25*  --   --  24*  CREATININE 1.01  --   --  1.01  CALCIUM 8.5*  --   --  8.6*  MG 2.2  --  2.0  --   PHOS 3.0  --  3.5  --    Lipid Panel:     Component Value Date/Time   CHOL 123 03/12/2020 0054   TRIG 102 03/14/2020 0609   HDL 34 (L) 03/12/2020 0054   CHOLHDL 3.6 03/12/2020 0054   VLDL 18 03/12/2020 0054   LDLCALC 71 03/12/2020 0054   HgbA1c:  Lab Results  Component Value Date   HGBA1C 5.8 (H) 03/12/2020   Urine Drug Screen:     Component Value Date/Time   LABOPIA NEGATIVE 03/13/2012 1900   COCAINSCRNUR NEGATIVE 03/13/2012 1900   LABBENZ NEGATIVE 03/13/2012 1900   AMPHETMU NEGATIVE 03/13/2012 1900   THCU NEGATIVE 03/13/2012 1900    LABBARB NEGATIVE 03/13/2012 1900    Alcohol Level No results found for: ETH  IMAGING  CT Code Stroke CTA Head W/WO contrast CT Code Stroke CTA Neck W/WO contrast 03/29/2020   Right M1 segment large vessel occlusion. No large vessel occlusion within the neck. 50% right and 30% left proximal ICA luminal narrowing. Mild-to-moderate left vertebral artery origin narrowing. Mild narrowing of the bilateral V4 segments, right P3 and left P2 segments.   CT HEAD CODE STROKE WO CONTRAST 03-29-2020 1. No acute intracranial process. Mild cerebral atrophy and chronic microvascular ischemic changes.  2. ASPECTS is 10  3. Remote lacunar insult versus dilated perivascular spaces involving the right basal ganglia, posterior limb of the right internal capsule and left  insula.   MR BRAIN WO CONTRAST MR ANGIO HEAD WO CONTRAST 03/12/2020 Sequela of acute/subacute right MCA territory infarct. Small acute right cerebellar insult. Interval revascularization of the right MCA. No high-grade intracranial narrowing or large vessel occlusion. Mild narrowing of the right V4, right P2 and left M2 segments.   DG Chest Port 1 View 03/12/2020 Increased bibasilar opacities. Slight interval retraction of ETT.  03/17/2020 1. Endotracheal tube distal tip in the midthoracic trachea.  2. Bibasilar subsegmental atelectasis.   DG Abd Portable 1V 03/13/2020 Orogastric tube tip and side port in distal stomach. No bowel obstruction or free air evident. Apparent staghorn calculi in left kidney, stable. Small pleural effusions bilaterally with bibasilar atelectasis.  03/01/2020 Enteric tube with tip in the distal esophagus, side-port in the mid esophagus. Recommend advancement of greater than 10 cm for optimal placement.   ECHOCARDIOGRAM COMPLETE 03/08/2020 1. Left ventricular ejection fraction, by estimation, is 50 to 55%. The left ventricle has low normal function. The left ventricle has no regional wall motion  abnormalities. There is severe concentric left ventricular hypertrophy. Left ventricular diastolic parameters are indeterminate.   2. Right ventricular systolic function is normal. The right ventricular size is normal.   3. Left atrial size was severely dilated.   4. Right atrial size was severely dilated.   5. A small pericardial effusion is present. The pericardial effusion is surrounding the apex.   6. The mitral valve is grossly normal. Trivial mitral valve regurgitation.   7. The aortic valve is tricuspid. There is mild calcification of the aortic valve. Aortic valve regurgitation is mild. No aortic stenosis is present.   8. The inferior vena cava is normal in size with greater than 50% respiratory variability, suggesting right atrial pressure of 3 mmHg. Comparison(s): No prior Echocardiogram.   ECG - atrial fibrillation - ventricular response 81 BPM (See cardiology reading for complete details)   PHYSICAL EXAM  Temp:  [97.6 F (36.4 C)-98.5 F (36.9 C)] 98.4 F (36.9 C) (12/15 0400) Pulse Rate:  [74-92] 84 (12/15 0500) Resp:  [11-21] 16 (12/14 2100) BP: (132-181)/(79-159) 157/97 (12/15 0500) SpO2:  [93 %-100 %] 99 % (12/15 0500) FiO2 (%):  [40 %] 40 % (12/14 0819)  General - Well nourished, well developed, lethargic.  Ophthalmologic - fundi not visualized due to noncooperation.  Cardiovascular - irregularly irregular heart rate and rhythm.  Neuro - lethargic, now extubated, eyes closed and barely open with voice or pain, but was able to follow all simple midline and right hand and foot commands. However, nonverbal with anarthria. With forced eye opening, eyes in right gaze preference position, barely cross midline, not blinking to visual threat on the left, inconsistently blinking on the right, pupils 67mm bilaterally reactive to light. Left facial droop, tongue midline in mouth but protrusion not cooperative. RUE spontaneous movement able to against gravity, able to follow  commands on the right hand. RLE proximal 2/5 and knee flexion 3+/5 on pain, LUE flaccid, LLE mild withdraw movement of the knee. DTR 1+ and no babinski. Sensation, coordination not cooperative and gait not tested.   ASSESSMENT/PLAN [name] is a 86 y.o. male with history of hypertension, hypothyroidism, hearing impaired, subdural hematoma in 2013 and atrial fibrillation not on anticoagulation who presents with dysarthria and left-sided weakness. The pt vomited en route and there was concern for possible aspiration.  The patient received IV t-PA Saturday 03/22/2020 at 9:45 AM. IR 03/10/2020 at 11:40 AM - S/P complete revascularization of RT MCA  M1 occlusion achieving a TICI 3 revascularization.  Stroke: Rt MCA and small R cerebellar infarcts - embolic - likely from atrial fibrillation without anticoagulation  CT Head -  No acute intracranial process. Remote lacunar insult versus dilated perivascular spaces involving the right basal ganglia, posterior limb of the right internal capsule and left insula.   CTA H&N - Right M1 segment large vessel occlusion. No large vessel occlusion within the neck. 50% right and 30% left proximal ICA luminal narrowing. Mild-to-moderate left vertebral artery origin narrowing. Mild narrowing of the bilateral V4 segments, right P3 and left P2 segments.  MRI / MRA head - R large extensive MCA infarct. Small R cerebellar infarct. Interval revascularization R MCA. Mild narrowing R V4, R P2, L M2  2D Echo - EF 50 - 55%. No cardiac source of emboli identified.   [employer] Virus 2 - negative  LDL - 71  HgbA1c - 5.8  VTE prophylaxis - Heparin 5000 units sq tid   aspirin 81 mg daily prior to admission, now on aspirin 325 mg daily   Therapy recommendations: SNF  Disposition:  Pending  Afib  Chronic afib but not sure about afib burden   Was on coumadin in the past, off after SDH  Never restarted due to advanced age, spontaneous SDH, gross  hematuria  Follow with Dr. Marge Duncans at [facility]  Currently rate controlled  Now on ASA  Respiratory failure  Intubated on vent ->now extubated  Off sedation  CCM on board  Tolerating well post extubation  Aspiration pneumonia  Vomited x2 before intubation  T Max - 100.4->afebrile  IV Unasyn started 27-Mar-2020-> off now  [facility] - 6.0->9.1->10.3->8.1  Respiratory cultures - normal respiratory flora.  CCM on board  Hypertension  Home BP meds: Coreg  Current BP meds: Resumed Coreg   Stable . BP goal post tPA < 180/105, gradually normalize in 5-7 days  . Long-term BP goal normotensive  Hyperlipidemia  Home Lipid lowering medication: none   LDL 71, goal < 70  Put on lipitor 20  Continue statin on discharge  Dysphagia  N.p.o.  s/p cortrak 12/15  On TF and FW  Speech to follow  Other Stroke Risk Factors  Advanced age  Previous ETOH use  Hx of R SDH 2013 s/p craniotomy  Possible remote lacunar by imaging  Other Active Problems, Findings and Recommendations  Code status - Limited code CKD - stage 3a - creatinine - 1.26->1.21->1.25->1.01 on Vit D Hypothyroid on synthroid On Xalatan    Hematuria - catheter related trauma - monitoring  Hospital day # 4  This patient is critically ill due to large right MCA infarct, Afib not on AC, respiratory faillure, dysphagia and at significant risk of neurological worsening, death form recurrent stroke, heart failure, aspiration, seizure, anemia. This patient's care requires constant monitoring of vital signs, hemodynamics, respiratory and cardiac monitoring, review of multiple databases, neurological assessment, discussion with family, other specialists and medical decision making of high complexity. I spent 40 minutes of neurocritical care time in the care of this patient. I had long discussion with son at bedside, updated pt current condition, treatment plan and potential prognosis, and answered all  the questions. Son expressed understanding and appreciation.    Marvel Plan, MD PhD Stroke Neurology 03/15/2020 12:28 PM   To contact Stroke Continuity provider, please refer to [email or web address]. After hours, contact General Neurology

## 2020-03-15 NOTE — NC FL2 (Signed)
[place] MEDICAID FL2 LEVEL OF CARE SCREENING TOOL     IDENTIFICATION  Patient Name: Jeffrey Ashley Birthdate: 1926/11/16 Sex: male Admission Date (Current Location): 03/01/2020  [facility] [place] and [place] Number:  Herbalist and Address:  [facility] [place]. [facility], [place] [address] [place], [place] [address]      Provider Number: [id number]  Attending Physician Name and Address:  Rosalin Hawking, MD  Relative Name and Phone Number:  Karsten Ro [phone]    Current Level of Care: Hospital Recommended Level of Care: [place] Prior Approval Number:    Date Approved/Denied:   PASRR Number: [id number]  Discharge Plan: SNF    Current Diagnoses: Patient Active Problem List   Diagnosis Date Noted  . Protein-calorie malnutrition, severe 03/14/2020  . Stroke ([place]) 03/01/2020  . Stroke (cerebrum) ([place]) 03/07/2020  . 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 Glaucoma 12/09/2019  . Atrial fibrillation, chronic ([place]) 08/17/2019  . Essential hypertension 08/17/2019  . Hypothyroidism 08/17/2019    Orientation RESPIRATION BLADDER Height & Weight        O2 Incontinent Weight: 176 lb 5.9 oz (80 kg) Height:  6' (182.9 cm)  BEHAVIORAL SYMPTOMS/MOOD NEUROLOGICAL BOWEL NUTRITION STATUS      Continent Feeding tube  AMBULATORY STATUS COMMUNICATION OF NEEDS Skin   Total Care Verbally Normal                       Personal Care Assistance Level of Assistance  Bathing,Feeding,Dressing Bathing Assistance: Maximum assistance Feeding assistance: Maximum assistance Dressing Assistance: Maximum assistance     Functional Limitations Info  Sight,Hearing,Speech Sight Info: Adequate Hearing Info: Adequate Speech Info: Impaired    SPECIAL CARE FACTORS FREQUENCY  PT (By licensed PT),OT (By licensed OT)     PT Frequency: 5x week OT Frequency: 5x week            Contractures Contractures Info: Not present    Additional  Factors Info  Code Status,Allergies Code Status Info: DNR Allergies Info: Brimonidine Tartrate, Brimonidine Tartrate-timolol           Current Medications (03/15/2020):  This is the current hospital active medication list Current Facility-Administered Medications  Medication Dose Route Frequency Provider Last Rate Last Admin  . 0.9 %  sodium chloride infusion  50 mL Intravenous Once Greta Doom, MD      . acetaminophen (TYLENOL) 160 MG/5ML solution 650 mg  650 mg Per Tube Q4H PRN Deveshwar, Willaim Rayas, MD       Or  . acetaminophen (TYLENOL) suppository 650 mg  650 mg Rectal Q4H PRN Deveshwar, Sanjeev, MD      . aspirin tablet 325 mg  325 mg Per Tube Daily Rosalin Hawking, MD   325 mg at 03/15/20 1138  . atorvastatin (LIPITOR) tablet 20 mg  20 mg Per Tube Daily Rosalin Hawking, MD   20 mg at 03/15/20 1501  . carvedilol (COREG) tablet 25 mg  25 mg Per Tube BID Rosalin Hawking, MD   25 mg at 03/15/20 1138  . chlorhexidine gluconate (MEDLINE KIT) (PERIDEX) 0.12 % solution 15 mL  15 mL Mouth Rinse BID Rigoberto Noel, MD   15 mL at 03/15/20 0800  . Chlorhexidine Gluconate Cloth 2 % PADS 6 each  6 each Topical Daily Rigoberto Noel, MD   6 each at 03/15/20 1142  . cholecalciferol (VITAMIN D3) tablet 5,000 Units  5,000 Units Per Tube Daily Rosalin Hawking, MD   5,000  Units at 03/15/20 1138  . clevidipine (CLEVIPREX) infusion 0.5 mg/mL  0-21 mg/hr Intravenous Continuous Olalere, Adewale A, MD      . feeding supplement (OSMOLITE 1.5 CAL) liquid 1,000 mL  1,000 mL Per Tube Continuous Rosalin Hawking, MD 60 mL/hr at 03/15/20 1501 1,000 mL at 03/15/20 1501  . feeding supplement (PROSource TF) liquid 45 mL  45 mL Per Tube TID Rosalin Hawking, MD      . free water 200 mL  200 mL Per Tube Q6H Mosetta Anis, MD   200 mL at 03/15/20 1142  . heparin injection 5,000 Units  5,000 Units Subcutaneous Q8H Rosalin Hawking, MD   5,000 Units at 03/15/20 1459  . labetalol (NORMODYNE) injection 10 mg  10 mg Intravenous Q2H PRN Olalere,  Adewale A, MD   10 mg at 03/15/20 0729  . latanoprost (XALATAN) 0.005 % ophthalmic solution 1 drop  1 drop Both Eyes QHS Rosalin Hawking, MD   1 drop at 03/14/20 2245  . levothyroxine (SYNTHROID) tablet 50 mcg  50 mcg Per Tube QAC breakfast Rosalin Hawking, MD   50 mcg at 03/14/20 0801  . MEDLINE mouth rinse  15 mL Mouth Rinse 10 times per day Rigoberto Noel, MD   15 mL at 03/15/20 1142  . pantoprazole sodium (PROTONIX) 40 mg/20 mL oral suspension 40 mg  40 mg Per Tube Daily Rosalin Hawking, MD   40 mg at 03/15/20 1138     Discharge Medications: Please see discharge summary for a list of discharge medications.  Relevant Imaging Results:  Relevant Lab Results:   Additional Information SS# [id number]  Joanne Chars, LCSW

## 2020-03-16 DIAGNOSIS — E43 Unspecified severe protein-calorie malnutrition: Secondary | ICD-10-CM

## 2020-03-16 LAB — BASIC METABOLIC PANEL
Anion gap: 6 (ref 5–15)
BUN: 32 mg/dL — ABNORMAL HIGH (ref 8–23)
CO2: 21 mmol/L — ABNORMAL LOW (ref 22–32)
Calcium: 8.6 mg/dL — ABNORMAL LOW (ref 8.9–10.3)
Chloride: 115 mmol/L — ABNORMAL HIGH (ref 98–111)
Creatinine, Ser: 0.88 mg/dL (ref 0.61–1.24)
GFR, Estimated: >60 mL/min (ref >60)
Glucose, Bld: 142 mg/dL — ABNORMAL HIGH (ref 70–99)
Potassium: 4.1 mmol/L (ref 3.5–5.1)
Sodium: 142 mmol/L (ref 135–145)

## 2020-03-16 LAB — CBC
HCT: 35.7 % — ABNORMAL LOW (ref 39.0–52.0)
Hemoglobin: 11.6 g/dL — ABNORMAL LOW (ref 13.0–17.0)
MCH: 30.9 pg (ref 26.0–34.0)
MCHC: 32.5 g/dL (ref 30.0–36.0)
MCV: 94.9 fL (ref 80.0–100.0)
Platelets: 131 10*3/uL — ABNORMAL LOW (ref 150–400)
RBC: 3.76 MIL/uL — ABNORMAL LOW (ref 4.22–5.81)
RDW: 13.9 % (ref 11.5–15.5)
WBC: 8.3 10*3/uL (ref 4.0–10.5)
nRBC: 0 % (ref 0.0–0.2)

## 2020-03-16 LAB — GLUCOSE, CAPILLARY
Glucose-Capillary: 101 mg/dL — ABNORMAL HIGH (ref 70–99)
Glucose-Capillary: 108 mg/dL — ABNORMAL HIGH (ref 70–99)
Glucose-Capillary: 119 mg/dL — ABNORMAL HIGH (ref 70–99)
Glucose-Capillary: 125 mg/dL — ABNORMAL HIGH (ref 70–99)
Glucose-Capillary: 125 mg/dL — ABNORMAL HIGH (ref 70–99)
Glucose-Capillary: 128 mg/dL — ABNORMAL HIGH (ref 70–99)

## 2020-03-16 MED ORDER — SODIUM CHLORIDE 3 % IN NEBU
4.0000 mL | INHALATION_SOLUTION | Freq: Four times a day (QID) | RESPIRATORY_TRACT | Status: AC
  Administered 2020-03-17 – 2020-03-19 (×8): 4 mL via RESPIRATORY_TRACT
  Filled 2020-03-16 (×12): qty 4

## 2020-03-16 NOTE — Progress Notes (Signed)
Physical Therapy Treatment Patient Details Name: Jeffrey Ashley MRN: [id number] DOB: [birth year] Today's Date: 03/16/2020    History of Present Illness Patient is a 86 y/o male who presents with left sided weakness. 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 NIH:18. s/p tpA and complete revascularization right MCA 12/11. Remained intubated due to concerns for aspiration. Extubated 12/14. PMH includes HTN and Dysrhythmia.    PT Comments    The pt was seen by PT/OT in conjunction to safely address and progress assessment of functional mobility. The pt was initially lethargic, but increased arousal and response with movement of bed to chair position. The pt was then transitioned to sitting EOB, which he tolerated for ~10 min prior to asking to return to supine. The pt consistently follows commands for movements with R-sided extremities, but does require increased time and effort. He also requires mod/maxA of 2 to complete bed mobility and modA to miNG to maintain static sitting balance. The pt will continue to benefit from skilled PT to progress strength, stability, coordination, and endurance.    Follow Up Recommendations  SNF;Supervision for mobility/OOB     Equipment Recommendations  None recommended by PT    Recommendations for Other Services       Precautions / Restrictions Precautions Precautions: Fall Precaution Comments: left hemi; left neglect Restrictions Weight Bearing Restrictions: No    Mobility  Bed Mobility Overal bed mobility: Needs Assistance Bed Mobility: Supine to Sit;Sit to Supine;Rolling Rolling: Mod assist;Max assist (MOD to roll to pts L sided; MAX A to roll to pts R side)   Supine to sit: Max assist;+2 for physical assistance Sit to supine: Max assist;+2 for physical assistance   General bed mobility comments: MAX A +2 to transition to EOB, pt with good initiation with RLE when maneuvering BLES to EOB but pt ulitmatley needed MAX A +2 to elevate trunk and scoot  hips to EOB.  Transfers                 General transfer comment: deferred at this time   Modified Rankin (Stroke Patients Only) Modified Rankin (Stroke Patients Only) Pre-Morbid Rankin Score: Moderately severe disability Modified Rankin: Severe disability     Balance Overall balance assessment: Needs assistance Sitting-balance support: Single extremity supported;Feet supported Sitting balance-Leahy Scale: Poor Sitting balance - Comments: pt requried up to MOD A for sitting balance with moments of min guard with RUE supported on bed rail                                    Cognition Arousal/Alertness: Lethargic;Awake/alert (initially lethargic however with moments of wakefulness once transitioned pt to EOB) Behavior During Therapy: [facility] for tasks assessed/performed;Flat affect Overall Cognitive Status: Difficult to assess Area of Impairment: Following commands                   Current Attention Level: Focused   Following Commands: Follows one step commands with increased time     Problem Solving: Decreased initiation;Requires verbal cues;Requires tactile cues General Comments: cog overall difficult to assess as pt unable to verbalize but does respond to commands with increased time. pt noddding appropriately during session and will gesture with thumbs up      Exercises      General Comments General comments (skin integrity, edema, etc.): Son present during session. education provided on sitting on pts L side and completing PROM to LUE. utilized oral  swabs and suction to clean out pts mouth as pt presents with heavy secretions and garbled cough      Pertinent Vitals/Pain Pain Assessment: No/denies pain Faces Pain Scale: No hurt Pain Intervention(s): Limited activity within patient's tolerance;Monitored during session           PT Goals (current goals can now be found in the care plan section) Acute Rehab PT Goals Patient Stated Goal: per  son, to go to SNF at [facility] PT Goal Formulation: With family Time For Goal Achievement: 03/28/20 Potential to Achieve Goals: Fair Progress towards PT goals: Progressing toward goals    Frequency    Min 3X/week      PT Plan Current plan remains appropriate    Co-evaluation PT/OT/SLP Co-Evaluation/Treatment: Yes Reason for Co-Treatment: Complexity of the patient's impairments (multi-system involvement);Necessary to address cognition/behavior during functional activity;For patient/therapist safety;To address functional/ADL transfers PT goals addressed during session: Mobility/safety with mobility;Balance;Strengthening/ROM OT goals addressed during session: ADL's and self-care;Strengthening/ROM      AM-PAC PT "6 Clicks" Mobility   Outcome Measure  Help needed turning from your back to your side while in a flat bed without using bedrails?: A Lot Help needed moving from lying on your back to sitting on the side of a flat bed without using bedrails?: A Lot Help needed moving to and from a bed to a chair (including a wheelchair)?: Total Help needed standing up from a chair using your arms (e.g., wheelchair or bedside chair)?: Total Help needed to walk in hospital room?: Total Help needed climbing 3-5 steps with a railing? : Total 6 Click Score: 8    End of Session   Activity Tolerance: Patient limited by fatigue Patient left: in bed;with call bell/phone within reach;with bed alarm set;with family/visitor present Nurse Communication: Mobility status;Need for lift equipment PT Visit Diagnosis: Hemiplegia and hemiparesis Hemiplegia - Right/Left: Left Hemiplegia - dominant/non-dominant: Non-dominant Hemiplegia - caused by: Cerebral infarction     Time: 4481-8563 PT Time Calculation (min) (ACUTE ONLY): 34 min  Charges:  $Therapeutic Activity: 8-22 mins                     Rolm Baptise, PT, DPT   Acute Rehabilitation Department Pager #: [phone]   Gaetana Michaelis 03/16/2020, 6:16 PM

## 2020-03-16 NOTE — Progress Notes (Signed)
Occupational Therapy Treatment Patient Details Name: Jeffrey Ashley MRN: [id number] DOB: October 25, 1926 Today's Date: 03/16/2020    History of present illness Patient is a 86 y/o male who presents with left sided weakness. 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 NIH:18. s/p tpA and complete revascularization right MCA 12/11. Remained intubated due to concerns for aspiration. Extubated 12/14. PMH includes HTN and Dysrhythmia.   OT comments  Pt seen in conjunction with PT to maximize pts activity tolerance and progress pts functional mobility goals. Pt initially lethargic upon arrival but did arouse more once sitting EOB. Pt continues to present with impaired balance, decreased activity tolerance, L sided weakness and L sided inattention. Pt was able to track his son to the L with cues this session. Pt completed UB grooming to wash his face with RUE and was able to locate LUE with RUE as precursor for functional self ROM, pt does endorse decreased sensation in LUE by nodding head. Pt following commands with increased time and nods/ gestures appropriately during session. Pt required up to [facility] for sitting balance EOB with moments of min guard with RUE supported. Pt completed lateral weight shifts once EOB from R elbow to midline needing tactile cues to return to midline. Pt would continue to benefit from skilled occupational therapy while admitted and after d/c to address the below listed limitations in order to improve overall functional mobility and facilitate independence with BADL participation. DC plan remains appropriate, will follow acutely per POC.    Follow Up Recommendations  SNF;Supervision/Assistance - 24 hour    Equipment Recommendations  Other (comment) (TBD)    Recommendations for Other Services      Precautions / Restrictions Precautions Precautions: Fall Precaution Comments: left hemi; left neglect Restrictions Weight Bearing Restrictions: No       Mobility Bed Mobility Overal  bed mobility: Needs Assistance Bed Mobility: Supine to Sit;Sit to Supine;Rolling Rolling: Mod assist;Max assist (MOD to roll to pts L sided; MAX A to roll to pts R side)   Supine to sit: Max assist;+2 for physical assistance Sit to supine: Max assist;+2 for physical assistance   General bed mobility comments: MAX A +2 to transition to EOB, pt with good initiation with RLE when maneuvering BLES to EOB but pt ulitmatley needed MAX A +2 to elevate trunk and scoot hips to EOB.  Transfers                 General transfer comment: NT    Balance Overall balance assessment: Needs assistance Sitting-balance support: Single extremity supported;Feet supported Sitting balance-Leahy Scale: Poor Sitting balance - Comments: pt requried up to MOD A for sitting balance with moments of min guard with RUE supported on bed rail                                   ADL either performed or assessed with clinical judgement   ADL Overall ADL's : Needs assistance/impaired     Grooming: Wash/dry face;Bed level Grooming Details (indicate cue type and reason): able to wash face with RUE with tactile cues             Lower Body Dressing: Total assistance;Bed level Lower Body Dressing Details (indicate cue type and reason): to don socks   Toilet Transfer Details (indicate cue type and reason): NT         Functional mobility during ADLs: Maximal assistance;+2 for physical assistance (bed mobility only)  General ADL Comments: pt continues to present with L sided weakness, suspect L sided inattention( although difficult to assess secondary to cog), generalized weakness and decreased activity tolerance. pt was able to transition from supine>EOB with MAX A +2 to sit EOB with moments of minguard +2 for safety     Vision Baseline Vision/History: Macular Degeneration (macular degeneration at baseline) Patient Visual Report: Other (comment) (suspect L sided inattention although difficult to  assess d/t cognition. although pt able to track with cues)     Perception     Praxis      Cognition Arousal/Alertness: Lethargic;Awake/alert (initially lethargic however with moments of wakefulness once transitioned pt to EOB) Behavior During Therapy: [facility] for tasks assessed/performed;Flat affect (falt but responded appropriately via head nods and hand signals) Overall Cognitive Status: Difficult to assess Area of Impairment: Following commands                       Following Commands: Follows one step commands with increased time       General Comments: cog overall difficult to assess as pt unable to verbalize but does respond to commands with increased time. pt noddding appropriately during session and will gesture with thumbs up        Exercises     Shoulder Instructions       General Comments Son present during session. education provided on sitting on pts L side and completing PROM to LUE. utilized oral swabs and suction to clean out pts mouth as pt presents with heavy secretions and garbled cough    Pertinent Vitals/ Pain       Pain Assessment: Faces Faces Pain Scale: No hurt  Home Living                                          Prior Functioning/Environment              Frequency  Min 2X/week        Progress Toward Goals  OT Goals(current goals can now be found in the care plan section)  Progress towards OT goals: Progressing toward goals  Acute Rehab OT Goals Patient Stated Goal: per son, to go to SNF at [facility] OT Goal Formulation: With patient/family Time For Goal Achievement: 03/28/20 Potential to Achieve Goals: Good  Plan Discharge plan remains appropriate;Frequency remains appropriate    Co-evaluation      Reason for Co-Treatment: For patient/therapist safety;Complexity of the patient's impairments (multi-system involvement);To address functional/ADL transfers;Necessary to address cognition/behavior during  functional activity   OT goals addressed during session: ADL's and self-care;Strengthening/ROM      AM-PAC OT "6 Clicks" Daily Activity     Outcome Measure   Help from another person eating meals?: Total (NPO) Help from another person taking care of personal grooming?: A Lot Help from another person toileting, which includes using toliet, bedpan, or urinal?: Total Help from another person bathing (including washing, rinsing, drying)?: Total Help from another person to put on and taking off regular upper body clothing?: Total Help from another person to put on and taking off regular lower body clothing?: Total 6 Click Score: 7    End of Session    OT Visit Diagnosis: Other abnormalities of gait and mobility (R26.89);Muscle weakness (generalized) (M62.81);Hemiplegia and hemiparesis Hemiplegia - Right/Left: Left Hemiplegia - dominant/non-dominant: Non-Dominant   Activity Tolerance Patient tolerated treatment well  Patient Left in bed;with call bell/phone within reach;with bed alarm set;with family/visitor present   Nurse Communication Mobility status        Time: 6378-5885 OT Time Calculation (min): 33 min  Charges: OT General Charges $OT Visit: 1 Visit OT Treatments $Self Care/Home Management : 8-22 mins Audery Amel., COTA/L Acute Rehabilitation Services [phone]    Angelina Pih 03/16/2020, 4:13 PM

## 2020-03-16 NOTE — Progress Notes (Signed)
STROKE TEAM PROGRESS NOTE   INTERVAL HISTORY Patient youngest son at bedside.  Patient still lethargic, eyes closed, however follow commands on the right side.  Gurgly sound in the throat, with frequent suctioning.  Apparently has difficulty with oral secretions. Will do chest PT and 3% saline nebulization  OBJECTIVE Vitals:   03/16/20 1100 03/16/20 1200 03/16/20 1242 03/16/20 1543  BP: 131/78  (!) 142/99 (!) 151/94  Pulse: 84  97 88  Resp: 14   16  Temp:  98.6 F (37 C) 97.7 F (36.5 C) (!) 97.5 F (36.4 C)  TempSrc:  Axillary Axillary Oral  SpO2: 97%  100% 98%  Weight:      Height:       CBC:  Recent Labs  Lab 03/30/2020 0933 03/25/2020 1452 03/12/20 0054 03/13/20 0654 03/15/20 0118 03/16/20 0030  WBC 6.0  --  9.1   < > 8.1 8.3  NEUTROABS 3.7  --  8.1*  --   --   --   HGB 13.0   < > 11.6*   < > 11.1* 11.6*  HCT 41.2   < > 35.3*   < > 33.6* 35.7*  MCV 93.6  --  91.0   < > 94.4 94.9  PLT 173  --  150   < > 133* 131*   < > = values in this interval not displayed.   Basic Metabolic Panel:  Recent Labs  Lab 03/14/20 1722 03/15/20 0118 03/15/20 1013 03/16/20 0030  NA  --  146*  --  142  K  --  4.1  --  4.1  CL  --  115*  --  115*  CO2  --  22  --  21*  GLUCOSE  --  97  --  142*  BUN  --  24*  --  32*  CREATININE  --  1.01  --  0.88  CALCIUM  --  8.6*  --  8.6*  MG 2.0  --  2.0  --   PHOS 3.5  --  3.6  --    Lipid Panel:     Component Value Date/Time   CHOL 123 03/12/2020 0054   TRIG 102 03/14/2020 0609   HDL 34 (L) 03/12/2020 0054   CHOLHDL 3.6 03/12/2020 0054   VLDL 18 03/12/2020 0054   LDLCALC 71 03/12/2020 0054   HgbA1c:  Lab Results  Component Value Date   HGBA1C 5.8 (H) 03/12/2020   Urine Drug Screen:     Component Value Date/Time   LABOPIA NEGATIVE 03/13/2012 1900   COCAINSCRNUR NEGATIVE 03/13/2012 1900   LABBENZ NEGATIVE 03/13/2012 1900   AMPHETMU NEGATIVE 03/13/2012 1900   THCU NEGATIVE 03/13/2012 1900   LABBARB NEGATIVE 03/13/2012 1900     Alcohol Level No results found for: ETH  IMAGING  CT HEAD CODE STROKE WO CONTRAST 03/28/2020 1. No acute intracranial process. Mild cerebral atrophy and chronic microvascular ischemic changes.  2. ASPECTS is 10  3. Remote lacunar insult versus dilated perivascular spaces involving the right basal ganglia, posterior limb of the right internal capsule and left insula.   CT Code Stroke CTA Head W/WO contrast CT Code Stroke CTA Neck W/WO contrast 03/20/2020   Right M1 segment large vessel occlusion. No large vessel occlusion within the neck. 50% right and 30% left proximal ICA luminal narrowing. Mild-to-moderate left vertebral artery origin narrowing. Mild narrowing of the bilateral V4 segments, right P3 and left P2 segments.   MR BRAIN WO CONTRAST MR ANGIO HEAD WO  CONTRAST 03/12/2020 Sequela of acute/subacute right MCA territory infarct. Small acute right cerebellar insult. Interval revascularization of the right MCA. No high-grade intracranial narrowing or large vessel occlusion. Mild narrowing of the right V4, right P2 and left M2 segments.   DG Chest Port 1 View 03/15/2020 1.  Interval endotracheal extubation. 2. There is improved aeration of the lungs without acute airspace opacity. 3.  Cardiomegaly. 03/12/2020 Increased bibasilar opacities. Slight interval retraction of ETT.  2020-03-23 1. Endotracheal tube distal tip in the midthoracic trachea.  2. Bibasilar subsegmental atelectasis.   DG Abd Portable 1V 03/13/2020 Orogastric tube tip and side port in distal stomach. No bowel obstruction or free air evident. Apparent staghorn calculi in left kidney, stable. Small pleural effusions bilaterally with bibasilar atelectasis.  March 23, 2020 Enteric tube with tip in the distal esophagus, side-port in the mid esophagus. Recommend advancement of greater than 10 cm for optimal placement.   ECHOCARDIOGRAM COMPLETE 03-23-20 1. Left ventricular ejection fraction, by estimation, is 50 to  55%. The left ventricle has low normal function. The left ventricle has no regional wall motion abnormalities. There is severe concentric left ventricular hypertrophy. Left ventricular diastolic parameters are indeterminate.   2. Right ventricular systolic function is normal. The right ventricular size is normal.   3. Left atrial size was severely dilated.   4. Right atrial size was severely dilated.   5. A small pericardial effusion is present. The pericardial effusion is surrounding the apex.   6. The mitral valve is grossly normal. Trivial mitral valve regurgitation.   7. The aortic valve is tricuspid. There is mild calcification of the aortic valve. Aortic valve regurgitation is mild. No aortic stenosis is present.   8. The inferior vena cava is normal in size with greater than 50% respiratory variability, suggesting right atrial pressure of 3 mmHg. Comparison(s): No prior Echocardiogram.   ECG - atrial fibrillation - ventricular response 81 BPM (See cardiology reading for complete details)   PHYSICAL EXAM   Temp:  [97.1 F (36.2 C)-98.6 F (37 C)] 97.5 F (36.4 C) (12/16 1543) Pulse Rate:  [76-109] 88 (12/16 1543) Resp:  [14-27] 16 (12/16 1543) BP: (127-165)/(73-116) 151/94 (12/16 1543) SpO2:  [97 %-100 %] 98 % (12/16 1543)  General - Well nourished, well developed, lethargic.  Ophthalmologic - fundi not visualized due to noncooperation.  Cardiovascular - irregularly irregular heart rate and rhythm.  Neuro - lethargic, eyes closed and slightly open with voice or pain, but was able to follow all simple midline and right hand and foot commands. However, nonverbal with anarthria. With forced eye opening, eyes in right gaze preference position, however able to cross midline, although left gaze incomplete. Not blinking to visual threat on the left, inconsistently blinking on the right, pupils equal bilaterally reactive to light. Left facial droop, tongue midline in mouth but protrusion  not cooperative. RUE spontaneous movement able to against gravity, able to follow commands on the right hand. RLE proximal 2/5 and knee flexion 3+/5 on pain, LUE flaccid, LLE mild withdraw movement of the knee. DTR 1+ and no babinski. Sensation, coordination not cooperative and gait not tested.   ASSESSMENT/PLAN [name] is a 86 y.o. male with history of hypertension, hypothyroidism, hearing impaired, subdural hematoma in 2013 and atrial fibrillation not on anticoagulation who presents with dysarthria and left-sided weakness. The pt vomited en route and there was concern for possible aspiration.  The patient received IV t-PA Saturday 2020-03-23 at 9:45 AM. IR 03-23-20 at 11:40 AM -  S/P complete revascularization of RT MCA M1 occlusion achieving a TICI 3 revascularization.  Stroke: Rt MCA and small R cerebellar infarcts - embolic - likely from atrial fibrillation without anticoagulation  CT Head -  No acute intracranial process. Remote lacunar insult versus dilated perivascular spaces involving the right basal ganglia, posterior limb of the right internal capsule and left insula.   CTA H&N - Right M1 segment large vessel occlusion. No large vessel occlusion within the neck. 50% right and 30% left proximal ICA luminal narrowing. Mild-to-moderate left vertebral artery origin narrowing. Mild narrowing of the bilateral V4 segments, right P3 and left P2 segments.  MRI / MRA head - R large extensive MCA infarct. Small R cerebellar infarct. Interval revascularization R MCA. Mild narrowing R V4, R P2, L M2  2D Echo - EF 50 - 55%. No cardiac source of emboli identified.   [employer] Virus 2 - negative  LDL - 71  HgbA1c - 5.8  VTE prophylaxis - Heparin 5000 units sq tid   aspirin 81 mg daily prior to admission, now on aspirin 325 mg daily   Therapy recommendations: SNF  Disposition:  Pending  Chronic Afib  Chronic afib but not sure about afib burden   Was on coumadin in the past,  off after SDH  Never restarted due to advanced age, spontaneous SDH, gross hematuria  Follow with Dr. Marge Duncans at [facility]  Currently rate controlled  Now on ASA. If pt made significant recovery, may need to discuss with Dr. Larey Dresser at [place] [facility] regarding [facility]. Son also wants to be involved in decision making at that time.  Acute Hypoxic Respiratory failure  Intubated on vent ->now extubated  Off sedation  CCM on board  Tolerating post extubation, however still has difficulty with oral secretion  Chest PT  3% saline nebulization  NT suctioning as needed  Aspiration pneumonia  Vomited x2 before intubation  T Max - 100.4->afebrile  IV Unasyn started March 20, 2020-> off now  [facility] - 6.0->9.1->10.3->8.1->8.3  Respiratory cultures - normal respiratory flora.  CCM sign off  Hypertension  Home BP meds: Coreg  Current BP meds: Resumed Coreg   Stable . BP goal post tPA < 180/105, gradually normalize in 5-7 days  . Long-term BP goal normotensive  Hyperlipidemia  Home Lipid lowering medication: none   LDL 71, goal < 70  Put on lipitor 20  Continue statin on discharge  Dysphagia  N.p.o.  s/p cortrak 12/15  On TF and FW  Speech to follow  Other Stroke Risk Factors  Advanced age  Previous ETOH use  Hx of R SDH 2013 s/p craniotomy  Possible remote lacunar by imaging  Other Active Problems, Findings and Recommendations  Code status - Limited code CKD - stage 3a - creatinine - 1.26->1.21->1.25->1.01->0.88 on Vit D Hypothyroid on synthroid On Xalatan    Hematuria - catheter related trauma - monitoring  Hospital day # 5  This patient is critically ill due to large right MCA stroke status post TPA and thrombectomy, respiratory failure, dysphagia, aspiration pneumonia, A. fib not on AC and at significant risk of neurological worsening, death form recurrent stroke, hemorrhagic conversion, respite failure, septic shock, heart failure,  seizure. This patient's care requires constant monitoring of vital signs, hemodynamics, respiratory and cardiac monitoring, review of multiple databases, neurological assessment, discussion with family, other specialists and medical decision making of high complexity. I spent 35 minutes of neurocritical care time in the care of this patient. I had long discussion with son  at bedside, updated pt current condition, treatment plan and potential prognosis, and answered all the questions.  Son expressed understanding and appreciation.    Marvel Plan, MD PhD Stroke Neurology 03/16/2020 4:45 PM   To contact Stroke Continuity provider, please refer to [email or web address]. After hours, contact General Neurology

## 2020-03-16 NOTE — Evaluation (Signed)
Clinical/Bedside Swallow Evaluation Patient Details  Name: Jeffrey Ashley MRN: [id number] Date of Birth: 1926-07-01  Today's Date: 03/16/2020 Time: SLP Start Time (ACUTE ONLY): 1011 SLP Stop Time (ACUTE ONLY): 1030 SLP Time Calculation (min) (ACUTE ONLY): 19 min  Past Medical History:  Past Medical History:  Diagnosis Date  . 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 Dysrhythmia    AFIB  . HOH (hard of hearing)   . Hypertension   . Hypothyroidism   . Nontraumatic subdural hematoma (HCC)   . 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 APPENDECTOMY    . BRAIN SURGERY     CRANIO  . CATARACT EXTRACTION W/PHACO Left 03/06/2017   Procedure: CATARACT EXTRACTION PHACO AND INTRAOCULAR LENS PLACEMENT (IOC);  Surgeon: Nevada Crane, MD;  Location: [facility] ORS;  Service: Ophthalmology;  Laterality: Left;  [place] 01:10.8 AP% 18.8 CDE 13.39 FLUID PACK LOT # [id number]  . CATARACT EXTRACTION W/PHACO Right 08/16/2019   Procedure: CATARACT EXTRACTION PHACO AND INTRAOCULAR LENS PLACEMENT (IOC) RIGHT ISTENT INJ 8.84  01:03.5;  Surgeon: Nevada Crane, MD;  Location: [facility];  Service: Ophthalmology;  Laterality: Right;  . CHOLECYSTECTOMY    . INTRAMEDULLARY (IM) NAIL INTERTROCHANTERIC Left 08/18/2019   Procedure: INTRAMEDULLARY (IM) NAIL INTERTROCHANTRIC;  Surgeon: Lyndle Herrlich, MD;  Location: [facility] ORS;  Service: Orthopedics;  Laterality: Left;  . IR CT HEAD LTD  03/29/2020  . IR PERCUTANEOUS ART THROMBECTOMY/INFUSION INTRACRANIAL INC DIAG ANGIO  03/19/2020  . Kidney Tumor Removal    . RADIOLOGY WITH ANESTHESIA N/A 03/17/2020   Procedure: IR WITH ANESTHESIA;  Surgeon: Radiologist, Medication, MD;  Location: [facility] OR;  Service: Radiology;  Laterality: N/A;   HPI:  Jeffrey Ashley is 86 yo M w/ dementia presenting w/ acute left sided weakness on code stroke. Received IV TPA and right MCA M1 re-vascularized by  neuro ir. Remained intubated due to suspected aspiration. MRI on 12/12 post-intervention showing large right MCA infarct. CXR 12/15: "There is improved aeration of the lungs without acute airspace opacity."   Assessment / Plan / Recommendation Clinical Impression  Pt presents with clinical indicators of pharyngeal dysphagia. On SLP arrival pt had noted wet/congested vocal quality.  Pt exhibited difficulty acheiving phonation but did attempt to cough and phonate, though both were noted to be weak.  SLP suctioned and provided extensive oral care to reduce dried secretions in oral cavity. Pt was able to follow commands for OME.  There were significant B deficits (L>R) during OME.  With ice chip, pt initiated oral response with lingual lateralization and attempted to manipulate bolus, but was unable to masticate.  Ice chip moved to pharynx through seemingly passive transit. Pt attempted x3 to initate pharyngeal swallow reflex with laryngeal bobbing noted.  Pt seemingly unable to acheive complete swallow.  There was coughing noted and suction was used to help clear ice chip.  Although pt's attempt to swallow are encouraging, deficits are seemingly severe.  Given size of infarct and pt's age, prognosis for meaninful recovery is uncertain.  Spoke with pt's son about considering whether long term AMN would be acceptable.  Shared information about SLP role in dysphagia assessement and management.  Noted that with intransigent dysphagia, there is evidence that outcomes are not improved with long term PEG placement.  SLP to follow for PO trials to determine readiness for possible MBSS.    Recommend pt remain NPO  with alternate means of nutrition, hydration, and medication at this time.  SLP Visit Diagnosis: Dysphagia, oropharyngeal phase (R13.12)    Aspiration Risk  Severe aspiration risk    Diet Recommendation NPO        Other  Recommendations Oral Care Recommendations: Oral care QID   Follow up  Recommendations  (TBD)      Frequency and Duration min 2x/week  2 weeks       Prognosis Prognosis for Safe Diet Advancement: Guarded Barriers to Reach Goals: Severity of deficits      Swallow Study   General Date of Onset: 03/31/2020 HPI: Jeffrey Ashley is 86 yo M w/ dementia presenting w/ acute left sided weakness on code stroke. Received IV TPA and right MCA M1 re-vascularized by neuro ir. Remained intubated due to suspected aspiration. MRI on 12/12 post-intervention showing large right MCA infarct. CXR 12/15: "There is improved aeration of the lungs without acute airspace opacity." Type of Study: Bedside Swallow Evaluation Previous Swallow Assessment: none Diet Prior to this Study: NPO Temperature Spikes Noted: No Respiratory Status: Room air History of Recent Intubation: Yes Length of Intubations (days): 3 days Date extubated: 03/14/20 Behavior/Cognition: Lethargic/Drowsy Oral Cavity Assessment: Dried secretions Oral Care Completed by SLP: Yes Oral Cavity - Dentition: Adequate natural dentition Self-Feeding Abilities: Total assist Patient Positioning: Upright in bed Baseline Vocal Quality: Wet Volitional Cough: Weak Volitional Swallow: Unable to elicit    Oral/Motor/Sensory Function Overall Oral Motor/Sensory Function: Severe impairment Facial ROM: Reduced right;Reduced left Facial Symmetry: Abnormal symmetry left Lingual ROM: Reduced right;Reduced left Lingual Strength: Reduced Velum:  (could not assess) Mandible: Within Functional Limits   Ice Chips Ice chips: Impaired Oral Phase Impairments: Impaired mastication Pharyngeal Phase Impairments: Unable to trigger swallow;Decreased hyoid-laryngeal movement;Wet Vocal Quality;Cough - Delayed   Thin Liquid Thin Liquid: Not tested    Nectar Thick Nectar Thick Liquid: Not tested   Honey Thick Honey Thick Liquid: Not tested   Puree Puree: Not tested   Solid     Solid: Not tested      Kerrie Pleasure, MA, CCC-SLP Acute  Rehabilitation Services Office: [phone] 03/16/2020,11:09 AM

## 2020-03-16 NOTE — Progress Notes (Signed)
OT Cancellation Note  Patient Details Name: Kelcey Wickstrom MRN: [id number] DOB: 07-02-1926   Cancelled Treatment:    Reason Eval/Treat Not Completed: Other (comment) Attempted to see patient however moving to 3W, will re-attempt as schedule permits.  Marlyce Huge OT OT pager: [phone]   Carmelia Roller 03/16/2020, 12:47 PM

## 2020-03-16 NOTE — Progress Notes (Addendum)
Progress Note    Jeffrey Ashley  DDU:[id number] DOB: 10/06/1926  DOA: 03/22/2020 PCP: Venia Carbon, MD      Brief Narrative:    Medical records reviewed and are as summarized below:  Jeffrey Ashley is a 86 y.o. male with medical history significant for chronic atrial fibrillation, dementia, hypertension, hypothyroidism, BPH, nonmalignant breast tumor, nontraumatic subdural hematoma, who was brought to the hospital because of left-sided weakness.  He was found to have large right MCA stroke and small right cerebellar infarcts.   He was given IV TPA and thrombectomy was performed by neuro interventional radiologist on 03/18/2020.  He was intubated for the procedure.  He was left intubated post procedure and was transferred to the ICU for further management.  He was extubated on 03/14/2020 and transferred to the medical floor on 03/15/2020.  Hospitalist team was consulted to continue with medical management..    Assessment/Plan:   Active Problems:   Stroke ([place])   Stroke (cerebrum) (HCC)   Middle cerebral artery embolism, right   Protein-calorie malnutrition, severe   Nutrition Problem: Severe Malnutrition Etiology: chronic illness  Signs/Symptoms: severe fat depletion,severe muscle depletion   Body mass index is 23.92 kg/m.    Large right MCA stroke, small right cerebellar infarcts: s/p TPA and thrombectomy on 03/16/2020.  Continue aspirin and Lipitor.  Continue PT and OT.  Follow-up with neurologist.  Dysphagia: He is on enteral nutrition via NG tube that was placed on 03/15/2020.  Follow-up with speech therapist.  Chronic atrial fibrillation: He was previously on warfarin but this had been discontinued after he developed subdural hemorrhage.  Anticoagulation was not restarted because of advanced age and subdural hemorrhage.  Acute hypoxic respiratory failure: Extubated on 03/14/2020  Aspiration pneumonia: Completed IV Unasyn.  Hypertension: Continue  antihypertensives  Hyperlipidemia: Continue Lipitor  Plan of care was discussed with his son Lurena Joiner) at the bedside.   Diet Order            Diet NPO time specified  Diet effective now                    Consultants:  Neurologist  Intensivist  Procedures:  S/p TPA and thrombectomy on 03/14/2020  Intubated on 03/19/2020 and extubated on 03/14/2020    Medications:   . aspirin  325 mg Per Tube Daily  . atorvastatin  20 mg Per Tube Daily  . carvedilol  25 mg Per Tube BID  . chlorhexidine gluconate (MEDLINE KIT)  15 mL Mouth Rinse BID  . Chlorhexidine Gluconate Cloth  6 each Topical Daily  . cholecalciferol  5,000 Units Per Tube Daily  . feeding supplement (PROSource TF)  45 mL Per Tube TID  . free water  200 mL Per Tube Q6H  . heparin injection (subcutaneous)  5,000 Units Subcutaneous Q8H  . latanoprost  1 drop Both Eyes QHS  . levothyroxine  50 mcg Per Tube QAC breakfast  . mouth rinse  15 mL Mouth Rinse 10 times per day  . pantoprazole sodium  40 mg Per Tube Daily   Continuous Infusions: . sodium chloride    . clevidipine    . feeding supplement (OSMOLITE 1.5 CAL) 1,000 mL (03/15/20 1501)     Anti-infectives (From admission, onward)   Start     Dose/Rate Route Frequency Ordered Stop   03/27/2020 1445  Ampicillin-Sulbactam (UNASYN) 3 g in sodium chloride 0.9 % 100 mL IVPB  Status:  Discontinued  3 g 200 mL/hr over 30 Minutes Intravenous Every 8 hours 03/25/2020 1358 03/14/20 0823   03/17/2020 1026  ceFAZolin (ANCEF) 2-4 GM/100ML-% IVPB       Note to Pharmacy: Novella Rob   : cabinet override      03/23/2020 1026 03/19/2020 2229             Family Communication/Anticipated D/C date and plan/Code Status   DVT prophylaxis: heparin injection 5,000 Units Start: 03/12/20 2200 SCD's Start: 03/03/2020 1043     Code Status: DNR  Family Communication: Plan discussed with his son, Lurena Joiner, at the bedside Disposition Plan:    Status is:  Inpatient  Remains inpatient appropriate because:Unsafe d/c plan and Inpatient level of care appropriate due to severity of illness   Dispo: The patient is from: ALF              Anticipated d/c is to: SNF              Anticipated d/c date is: 3 days              Patient currently is not medically stable to d/c.           Subjective:   Patient was seen in the ICU (4N 18C).  Interval events noted.  Patient is nonverbal and confused and unable to provide any history.  His son, Lurena Joiner, is at the bedside.  His nurse is also at thebedside.  Objective:    Vitals:   03/16/20 0600 03/16/20 0700 03/16/20 0800 03/16/20 0900  BP:    (!) 156/116  Pulse: 85 84 76 92  Resp: _0 Temp:   98.6 F (37 C)   TempSrc:   Axillary   SpO2: 97% 99% 100% 99%  Weight:      Height:       No data found.   Intake/Output Summary (Last 24 hours) at 03/16/2020 1055 Last data filed at 03/16/2020 0537 Gross per 24 hour  Intake 500.08 ml  Output 950 ml  Net -449.92 ml   Filed Weights   03/05/2020 0922 03/14/20 0500 03/15/20 0500  Weight: 83 kg 80.1 kg 80 kg    Exam:  GEN: NAD SKIN: Warm and dry EYES: Eyes are downward and outward looking with right-sided gaze ENT: MMM, NG tube in place CV: RRR PULM: No wheezing or rales heard ABD: soft, ND, NT, +BS CNS: Aphasic,, left facial droop, left sided weakness EXT: No edema or tenderness   Data Reviewed:   I have personally reviewed following labs and imaging studies:  Labs: Labs show the following:   Basic Metabolic Panel: Recent Labs  Lab 03/12/20 0054 03/13/20 0654 03/13/20 1216 03/13/20 1629 03/14/20 0609 03/14/20 0954 03/14/20 1722 03/15/20 0118 03/15/20 1013 03/16/20 0030  NA 137 142  --   --  145 145  --  146*  --  142  K 3.9 3.5  --   --  3.5 4.0  --  4.1  --  4.1  CL 109 112*  --   --  114*  --   --  115*  --  115*  CO2 17* 19*  --   --  20*  --   --  22  --  21*  GLUCOSE 155* 120*  --   --  130*  --   --   97  --  142*  BUN 24* 24*  --   --  25*  --   --  24*  --  32*  CREATININE 1.21 1.25*  --   --  1.01  --   --  1.01  --  0.88  CALCIUM 8.2* 8.3*  --   --  8.5*  --   --  8.6*  --  8.6*  MG 1.6*  --  2.4 2.2 2.2  --  2.0  --  2.0  --   PHOS 2.6  --  3.1 3.2 3.0  --  3.5  --  3.6  --    GFR Estimated Creatinine Clearance: 60 mL/min (by C-G formula based on SCr of 0.88 mg/dL). Liver Function Tests: Recent Labs  Lab 03/24/2020 0933  AST 18  ALT 16  ALKPHOS 82  BILITOT 0.9  PROT 5.9*  ALBUMIN 3.4*   No results for input(s): LIPASE, AMYLASE in the last 168 hours. No results for input(s): AMMONIA in the last 168 hours. Coagulation profile Recent Labs  Lab 03/15/2020 0933  INR 1.1    CBC: Recent Labs  Lab 03/17/2020 0933 03/24/2020 1452 03/12/20 0054 03/13/20 0654 03/13/20 1629 03/14/20 0609 03/14/20 0954 03/15/20 0118 03/16/20 0030  WBC 6.0  --  9.1 10.3 9.3 8.3  --  8.1 8.3  NEUTROABS 3.7  --  8.1*  --   --   --   --   --   --   HGB 13.0   < > 11.6* 11.4* 11.1* 10.9* 10.2* 11.1* 11.6*  HCT 41.2   < > 35.3* 35.0* 34.9* 34.6* 30.0* 33.6* 35.7*  MCV 93.6  --  91.0 92.1 93.6 94.0  --  94.4 94.9  PLT 173  --  150 146* PLATELET CLUMPS NOTED ON SMEAR, COUNT APPEARS DECREASED 132*  --  133* 131*   < > = values in this interval not displayed.   Cardiac Enzymes: No results for input(s): CKTOTAL, CKMB, CKMBINDEX, TROPONINI in the last 168 hours. BNP (last 3 results) No results for input(s): PROBNP in the last 8760 hours. CBG: Recent Labs  Lab 03/15/20 1533 03/15/20 1930 03/15/20 2322 03/16/20 0320 03/16/20 0746  GLUCAP 99 154* 147* 101* 108*   D-Dimer: No results for input(s): DDIMER in the last 72 hours. Hgb A1c: No results for input(s): HGBA1C in the last 72 hours. Lipid Profile: Recent Labs    03/14/20 0609  TRIG 102   Thyroid function studies: No results for input(s): TSH, T4TOTAL, T3FREE, THYROIDAB in the last 72 hours.  Invalid input(s): FREET3 Anemia work  up: No results for input(s): VITAMINB12, FOLATE, FERRITIN, TIBC, IRON, RETICCTPCT in the last 72 hours. Sepsis Labs: Recent Labs  Lab 03/13/20 1629 03/14/20 0609 03/15/20 0118 03/16/20 0030  WBC 9.3 8.3 8.1 8.3    Microbiology Recent Results (from the past 240 hour(s))  Resp panel by RT-PCR (RSV, Flu A&B, Covid) Nasopharyngeal Swab     Status: None   Collection Time: 03/16/2020 12:18 PM   Specimen: Nasopharyngeal Swab; Nasopharyngeal(NP) swabs in vial transport medium  Result Value Ref Range Status   SARS Coronavirus 2 by RT PCR NEGATIVE NEGATIVE Final    Comment: (NOTE) SARS-CoV-2 target nucleic acids are NOT DETECTED.  The SARS-CoV-2 RNA is generally detectable in upper respiratory specimens during the acute phase of infection. The lowest concentration of SARS-CoV-2 viral copies this assay can detect is 138 copies/mL. A negative result does not preclude SARS-Cov-2 infection and should not be used as the sole basis for treatment or other patient management decisions. A negative result may occur with  improper specimen collection/handling,  submission of specimen other than nasopharyngeal swab, presence of viral mutation(s) within the areas targeted by this assay, and inadequate number of viral copies(<138 copies/mL). A negative result must be combined with clinical observations, patient history, and epidemiological information. The expected result is Negative.  Fact Sheet for Patients:  EntrepreneurPulse.com.au  Fact Sheet for Healthcare Providers:  IncredibleEmployment.be  This test is no t yet approved or cleared by the [place] FDA and  has been authorized for detection and/or diagnosis of SARS-CoV-2 by FDA under an Emergency Use Authorization (EUA). This EUA will remain  in effect (meaning this test can be used) for the duration of the COVID-19 declaration under Section 564(b)(1) of the Act, 21 U.S.C.section 360bbb-3(b)(1),  unless the authorization is terminated  or revoked sooner.       Influenza A by PCR NEGATIVE NEGATIVE Final   Influenza B by PCR NEGATIVE NEGATIVE Final    Comment: (NOTE) The Xpert Xpress SARS-CoV-2/FLU/RSV plus assay is intended as an aid in the diagnosis of influenza from Nasopharyngeal swab specimens and should not be used as a sole basis for treatment. Nasal washings and aspirates are unacceptable for Xpert Xpress SARS-CoV-2/FLU/RSV testing.  Fact Sheet for Patients: EntrepreneurPulse.com.au  Fact Sheet for Healthcare Providers: IncredibleEmployment.be  This test is not yet approved or cleared by the [place] FDA and has been authorized for detection and/or diagnosis of SARS-CoV-2 by FDA under an Emergency Use Authorization (EUA). This EUA will remain in effect (meaning this test can be used) for the duration of the COVID-19 declaration under Section 564(b)(1) of the Act, 21 U.S.C. section 360bbb-3(b)(1), unless the authorization is terminated or revoked.     Resp Syncytial Virus by PCR NEGATIVE NEGATIVE Final    Comment: (NOTE) Fact Sheet for Patients: EntrepreneurPulse.com.au  Fact Sheet for Healthcare Providers: IncredibleEmployment.be  This test is not yet approved or cleared by the [place] FDA and has been authorized for detection and/or diagnosis of SARS-CoV-2 by FDA under an Emergency Use Authorization (EUA). This EUA will remain in effect (meaning this test can be used) for the duration of the COVID-19 declaration under Section 564(b)(1) of the Act, 21 U.S.C. section 360bbb-3(b)(1), unless the authorization is terminated or revoked.  Performed at [facility], [place] [address], [place], [place] [address]   MRSA PCR Screening     Status: None   Collection Time: 03/20/2020  1:28 PM   Specimen: Nasopharyngeal  Result Value Ref Range Status   MRSA by PCR NEGATIVE NEGATIVE  Final    Comment:        The GeneXpert MRSA Assay (FDA approved for NASAL specimens only), is one component of a comprehensive MRSA colonization surveillance program. It is not intended to diagnose MRSA infection nor to guide or monitor treatment for MRSA infections. Performed at [facility], [place] [address], [place], [place] [address]   Culture, respiratory (non-expectorated)     Status: None   Collection Time: 03/19/2020  1:52 PM   Specimen: Tracheal Aspirate; Respiratory  Result Value Ref Range Status   Specimen Description TRACHEAL ASPIRATE  Final   Special Requests NONE  Final   Gram Stain   Final    RARE WBC PRESENT, PREDOMINANTLY PMN NO ORGANISMS SEEN    Culture   Final    RARE Normal respiratory flora-no Staph aureus or Pseudomonas seen Performed at [facility] [address] [place], [place] [address]    Report Status 03/13/2020 FINAL  Final  Procedures and diagnostic studies:  DG CHEST PORT 1 VIEW  Result Date: 03/15/2020 CLINICAL DATA:  Acute respiratory failure EXAM: PORTABLE CHEST 1 VIEW COMPARISON:  03/12/2020 FINDINGS: Interval endotracheal extubation. There is improved aeration of the lungs without acute airspace opacity. Cardiomegaly. IMPRESSION: 1.  Interval endotracheal extubation. 2. There is improved aeration of the lungs without acute airspace opacity. 3.  Cardiomegaly. Electronically Signed   By: Eddie Candle M.D.   On: 03/15/2020 08:19               LOS: 5 days      [facility]   Pager on [email or web address]. If 7PM-7AM, please contact night-coverage at [email or web address]     03/16/2020, 10:55 AM

## 2020-03-17 LAB — BASIC METABOLIC PANEL
Anion gap: 9 (ref 5–15)
BUN: 30 mg/dL — ABNORMAL HIGH (ref 8–23)
CO2: 22 mmol/L (ref 22–32)
Calcium: 8.6 mg/dL — ABNORMAL LOW (ref 8.9–10.3)
Chloride: 110 mmol/L (ref 98–111)
Creatinine, Ser: 0.9 mg/dL (ref 0.61–1.24)
GFR, Estimated: >60 mL/min (ref >60)
Glucose, Bld: 149 mg/dL — ABNORMAL HIGH (ref 70–99)
Potassium: 4.1 mmol/L (ref 3.5–5.1)
Sodium: 141 mmol/L (ref 135–145)

## 2020-03-17 LAB — CBC
HCT: 35.5 % — ABNORMAL LOW (ref 39.0–52.0)
Hemoglobin: 11.3 g/dL — ABNORMAL LOW (ref 13.0–17.0)
MCH: 29.9 pg (ref 26.0–34.0)
MCHC: 31.8 g/dL (ref 30.0–36.0)
MCV: 93.9 fL (ref 80.0–100.0)
Platelets: 154 10*3/uL (ref 150–400)
RBC: 3.78 MIL/uL — ABNORMAL LOW (ref 4.22–5.81)
RDW: 14 % (ref 11.5–15.5)
WBC: 9.7 10*3/uL (ref 4.0–10.5)
nRBC: 0 % (ref 0.0–0.2)

## 2020-03-17 LAB — GLUCOSE, CAPILLARY
Glucose-Capillary: 155 mg/dL — ABNORMAL HIGH (ref 70–99)
Glucose-Capillary: 120 mg/dL — ABNORMAL HIGH (ref 70–99)
Glucose-Capillary: 136 mg/dL — ABNORMAL HIGH (ref 70–99)
Glucose-Capillary: 150 mg/dL — ABNORMAL HIGH (ref 70–99)
Glucose-Capillary: 128 mg/dL — ABNORMAL HIGH (ref 70–99)

## 2020-03-17 NOTE — Progress Notes (Signed)
STROKE TEAM PROGRESS NOTE   INTERVAL HISTORY No family at bedside.  Patient still lethargic, however eyes open easier than yesterday, still follow commands on the right side, still has copious oral secretions needing frequent suctioning.  Overnight T-max 100.4.  OBJECTIVE Vitals:   03/16/20 1955 03/17/20 0015 03/17/20 0200 03/17/20 0432  BP: (!) 159/116 114/83  (!) 159/87  Pulse: 99 82  (!) 103  Resp: 19 20    Temp: 98.6 F (37 C) (!) 100.4 F (38 C) 99.7 F (37.6 C) 98.1 F (36.7 C)  TempSrc: Oral Axillary Axillary Axillary  SpO2: 95% 100%  98%  Weight:      Height:       CBC:  Recent Labs  Lab 03/29/20 0933 March 29, 2020 1452 03/12/20 0054 03/13/20 0654 03/15/20 0118 03/16/20 0030  WBC 6.0  --  9.1   < > 8.1 8.3  NEUTROABS 3.7  --  8.1*  --   --   --   HGB 13.0   < > 11.6*   < > 11.1* 11.6*  HCT 41.2   < > 35.3*   < > 33.6* 35.7*  MCV 93.6  --  91.0   < > 94.4 94.9  PLT 173  --  150   < > 133* 131*   < > = values in this interval not displayed.   Basic Metabolic Panel:  Recent Labs  Lab 03/14/20 1722 03/15/20 0118 03/15/20 1013 03/16/20 0030  NA  --  146*  --  142  K  --  4.1  --  4.1  CL  --  115*  --  115*  CO2  --  22  --  21*  GLUCOSE  --  97  --  142*  BUN  --  24*  --  32*  CREATININE  --  1.01  --  0.88  CALCIUM  --  8.6*  --  8.6*  MG 2.0  --  2.0  --   PHOS 3.5  --  3.6  --    Lipid Panel:     Component Value Date/Time   CHOL 123 03/12/2020 0054   TRIG 102 03/14/2020 0609   HDL 34 (L) 03/12/2020 0054   CHOLHDL 3.6 03/12/2020 0054   VLDL 18 03/12/2020 0054   LDLCALC 71 03/12/2020 0054   HgbA1c:  Lab Results  Component Value Date   HGBA1C 5.8 (H) 03/12/2020   Urine Drug Screen:     Component Value Date/Time   LABOPIA NEGATIVE 03/13/2012 1900   COCAINSCRNUR NEGATIVE 03/13/2012 1900   LABBENZ NEGATIVE 03/13/2012 1900   AMPHETMU NEGATIVE 03/13/2012 1900   THCU NEGATIVE 03/13/2012 1900   LABBARB NEGATIVE 03/13/2012 1900    Alcohol Level  No results found for: ETH  IMAGING  CT HEAD CODE STROKE WO CONTRAST Mar 29, 2020 1. No acute intracranial process. Mild cerebral atrophy and chronic microvascular ischemic changes.  2. ASPECTS is 10  3. Remote lacunar insult versus dilated perivascular spaces involving the right basal ganglia, posterior limb of the right internal capsule and left insula.   CT Code Stroke CTA Head W/WO contrast CT Code Stroke CTA Neck W/WO contrast 03/29/20   Right M1 segment large vessel occlusion. No large vessel occlusion within the neck. 50% right and 30% left proximal ICA luminal narrowing. Mild-to-moderate left vertebral artery origin narrowing. Mild narrowing of the bilateral V4 segments, right P3 and left P2 segments.   MR BRAIN WO CONTRAST MR ANGIO HEAD WO CONTRAST 03/12/2020 Sequela of acute/subacute  right MCA territory infarct. Small acute right cerebellar insult. Interval revascularization of the right MCA. No high-grade intracranial narrowing or large vessel occlusion. Mild narrowing of the right V4, right P2 and left M2 segments.   DG Chest Port 1 View 03/15/2020 1.  Interval endotracheal extubation. 2. There is improved aeration of the lungs without acute airspace opacity. 3.  Cardiomegaly. 03/12/2020 Increased bibasilar opacities. Slight interval retraction of ETT.  03-19-20 1. Endotracheal tube distal tip in the midthoracic trachea.  2. Bibasilar subsegmental atelectasis.   DG Abd Portable 1V 03/13/2020 Orogastric tube tip and side port in distal stomach. No bowel obstruction or free air evident. Apparent staghorn calculi in left kidney, stable. Small pleural effusions bilaterally with bibasilar atelectasis.  03/19/2020 Enteric tube with tip in the distal esophagus, side-port in the mid esophagus. Recommend advancement of greater than 10 cm for optimal placement.   ECHOCARDIOGRAM COMPLETE 03/19/20 1. Left ventricular ejection fraction, by estimation, is 50 to 55%. The left  ventricle has low normal function. The left ventricle has no regional wall motion abnormalities. There is severe concentric left ventricular hypertrophy. Left ventricular diastolic parameters are indeterminate.   2. Right ventricular systolic function is normal. The right ventricular size is normal.   3. Left atrial size was severely dilated.   4. Right atrial size was severely dilated.   5. A small pericardial effusion is present. The pericardial effusion is surrounding the apex.   6. The mitral valve is grossly normal. Trivial mitral valve regurgitation.   7. The aortic valve is tricuspid. There is mild calcification of the aortic valve. Aortic valve regurgitation is mild. No aortic stenosis is present.   8. The inferior vena cava is normal in size with greater than 50% respiratory variability, suggesting right atrial pressure of 3 mmHg. Comparison(s): No prior Echocardiogram.   ECG - atrial fibrillation - ventricular response 81 BPM (See cardiology reading for complete details)   PHYSICAL EXAM   Temp:  [97.5 F (36.4 C)-100.4 F (38 C)] 98.1 F (36.7 C) (12/17 0432) Pulse Rate:  [76-103] 103 (12/17 0432) Resp:  [14-20] 20 (12/17 0015) BP: (114-159)/(78-116) 159/87 (12/17 0432) SpO2:  [95 %-100 %] 98 % (12/17 0432)  General - Well nourished, well developed, lethargic.  Ophthalmologic - fundi not visualized due to noncooperation.  Cardiovascular - irregularly irregular heart rate and rhythm.  Neuro - lethargic, eyes open with voice or pain, able to follow all simple midline and right hand and foot commands. However, nonverbal with anarthria. With eye opening, eyes in right gaze preference position, however able to cross midline, although left gaze incomplete. Not blinking to visual threat on the left, inconsistently blinking on the right, pupils equal bilaterally reactive to light. Left facial droop, tongue midline in mouth but protrusion not cooperative. RUE spontaneous movement able  to against gravity, able to follow commands on the right hand. RLE proximal 2/5 and knee flexion 3+/5 on pain, LUE flaccid, LLE mild withdraw movement of the knee. DTR 1+ and no babinski. Sensation, coordination not cooperative and gait not tested.   ASSESSMENT/PLAN Jeffrey Ashley is a 86 y.o. male with history of hypertension, hypothyroidism, hearing impaired, subdural hematoma in 2013 and atrial fibrillation not on anticoagulation who presents with dysarthria and left-sided weakness. The pt vomited en route and there was concern for possible aspiration.  The patient received IV t-PA Saturday 03-19-20 at 9:45 AM. IR 03-19-2020 at 11:40 AM - S/P complete revascularization of RT MCA M1 occlusion achieving a TICI  3 revascularization.  Stroke: Rt MCA and small R cerebellar infarcts - embolic - likely from atrial fibrillation without anticoagulation  CT Head -  No acute intracranial process. Remote lacunar insult versus dilated perivascular spaces involving the right basal ganglia, posterior limb of the right internal capsule and left insula.   CTA H&N - Right M1 segment large vessel occlusion. No large vessel occlusion within the neck. 50% right and 30% left proximal ICA luminal narrowing. Mild-to-moderate left vertebral artery origin narrowing. Mild narrowing of the bilateral V4 segments, right P3 and left P2 segments.  MRI / MRA head - R large extensive MCA infarct. Small R cerebellar infarct. Interval revascularization R MCA. Mild narrowing R V4, R P2, L M2  2D Echo - EF 50 - 55%. No cardiac source of emboli identified.   [employer] Virus 2 - negative  LDL - 71  HgbA1c - 5.8  VTE prophylaxis - Heparin 5000 units sq tid   aspirin 81 mg daily prior to admission, now on aspirin 325 mg daily   Therapy recommendations: SNF  Disposition:  Pending - if respiratory / swallow condition no improvement over the weekend, may consider palliative care consult  Chronic Afib  Chronic afib but  not sure about afib burden   Was on coumadin in the past, off after SDH  Never restarted due to advanced age, spontaneous SDH, gross hematuria  Follow with Dr. Marge Duncans at [facility]  Currently rate controlled  Now on ASA. If pt made significant recovery, may need to discuss with Dr. Larey Dresser at [facility] regarding [facility]. Son also wants to be involved in decision making at that time.  Acute Hypoxic Respiratory failure  Intubated on vent ->now extubated  Off sedation  CCM on board  Tolerating post extubation, however still has difficulty with oral secretion  Chest PT  3% saline nebulization  NT suctioning as needed  CXR 12/18 pending  Aspiration pneumonia  Vomited x2 before intubation  T Max - 100.4->afebrile->100.4  IV Unasyn started March 23, 2020-> off now  [facility] - 6.0->9.1->10.3->8.1->8.3->9.7  Respiratory cultures - normal respiratory flora.  CXR 12/18 pending  Hypertension  Home BP meds: Coreg  Current BP meds: Resumed Coreg   Stable . BP goal post tPA < 180/105, gradually normalize in 5-7 days  . Long-term BP goal normotensive  Hyperlipidemia  Home Lipid lowering medication: none   LDL 71, goal < 70  Put on lipitor 20  Continue statin on discharge  Dysphagia  N.p.o.  s/p cortrak 12/15  On TF and FW  Speech to follow  Other Stroke Risk Factors  Advanced age  Previous ETOH use  Hx of R SDH 2013 s/p craniotomy  Possible remote lacunar by imaging  Other Active Problems, Findings and Recommendations  Code status - Limited code CKD - stage 3a - creatinine - 1.26->1.21->1.25->1.01->0.88->0.90 on Vit D Hypothyroid on synthroid On Xalatan    Hematuria - catheter related trauma - monitoring  Hospital day # 6  Marvel Plan, MD PhD Stroke Neurology 03/17/2020 9:13 PM  To contact Stroke Continuity provider, please refer to [email or web address]. After hours, contact General Neurology

## 2020-03-17 NOTE — Progress Notes (Addendum)
Progress Note    Jeffrey Ashley  THY:[id number] DOB: [birth year]  DOA: 03/02/2020 PCP: Venia Carbon, MD      Brief Narrative:    Medical records reviewed and are as summarized below:  Jeffrey Ashley is a 86 y.o. male with medical history significant for chronic atrial fibrillation, dementia, hypertension, hypothyroidism, BPH, nonmalignant breast tumor, nontraumatic subdural hematoma, who was brought to the hospital because of left-sided weakness.  He was found to have large right MCA stroke and small right cerebellar infarcts.   He was given IV TPA and thrombectomy was performed by neuro interventional radiologist on 03/03/2020.  He was intubated for the procedure.  He was left intubated post procedure and was transferred to the ICU for further management.  He was extubated on 03/14/2020 and transferred to the medical floor on 03/15/2020.  Hospitalist team was consulted to continue with medical management..    Assessment/Plan:   Active Problems:   Stroke ([place])   Stroke (cerebrum) (HCC)   Middle cerebral artery embolism, right   Protein-calorie malnutrition, severe   Nutrition Problem: Severe Malnutrition Etiology: chronic illness  Signs/Symptoms: severe fat depletion,severe muscle depletion   Body mass index is 26.25 kg/m.    Large right MCA stroke, small right cerebellar infarcts: s/p TPA and thrombectomy on 03/25/2020.  Continue aspirin and Lipitor.  Continue PT and OT.  Follow-up with neurologist.  Dysphagia, severe protein calorie malnutrition: Continue enteral nutrition via NG tube.  NG tube was placed on 03/15/2020.  Speech therapist to recommend strict n.p.o. at this time.  Suspect he may need PEG tube for long-term enteral nutrition.  This was discussed with his son, Gerald Stabs.  Chronic atrial fibrillation: He was previously on warfarin but this had been discontinued after he developed subdural hemorrhage.  Anticoagulation was not restarted because of  advanced age and subdural hemorrhage.  Acute hypoxic respiratory failure: Extubated on 03/14/2020  Aspiration pneumonia: Completed IV Unasyn.  Hypertension: Continue antihypertensives  Hyperlipidemia: Continue Lipitor  Plan of care was discussed with his son Gerald Stabs) at the bedside.   Diet Order            Diet NPO time specified  Diet effective now                    Consultants:  Neurologist  Intensivist  Procedures:  S/p TPA and thrombectomy on 03/01/2020  Intubated on 03/30/2020 and extubated on 03/14/2020    Medications:   . aspirin  325 mg Per Tube Daily  . atorvastatin  20 mg Per Tube Daily  . carvedilol  25 mg Per Tube BID  . chlorhexidine gluconate (MEDLINE KIT)  15 mL Mouth Rinse BID  . Chlorhexidine Gluconate Cloth  6 each Topical Daily  . cholecalciferol  5,000 Units Per Tube Daily  . feeding supplement (PROSource TF)  45 mL Per Tube TID  . free water  200 mL Per Tube Q6H  . heparin injection (subcutaneous)  5,000 Units Subcutaneous Q8H  . latanoprost  1 drop Both Eyes QHS  . levothyroxine  50 mcg Per Tube QAC breakfast  . mouth rinse  15 mL Mouth Rinse 10 times per day  . pantoprazole sodium  40 mg Per Tube Daily  . sodium chloride HYPERTONIC  4 mL Nebulization Q6H WA   Continuous Infusions: . sodium chloride    . clevidipine    . feeding supplement (OSMOLITE 1.5 CAL) 1,000 mL (03/15/20 1501)     Anti-infectives (From admission,  onward)   Start     Dose/Rate Route Frequency Ordered Stop   03/10/2020 1445  Ampicillin-Sulbactam (UNASYN) 3 g in sodium chloride 0.9 % 100 mL IVPB  Status:  Discontinued        3 g 200 mL/hr over 30 Minutes Intravenous Every 8 hours 03/02/2020 1358 03/14/20 0823   03/17/2020 1026  ceFAZolin (ANCEF) 2-4 GM/100ML-% IVPB       Note to Pharmacy: Novella Rob   : cabinet override      03/10/2020 1026 03/22/2020 2229             Family Communication/Anticipated D/C date and plan/Code Status   DVT prophylaxis:  heparin injection 5,000 Units Start: 03/12/20 2200 SCD's Start: 03/18/2020 1043     Code Status: DNR  Family Communication: Plan discussed with his son, Lurena Joiner, at the bedside Disposition Plan:    Status is: Inpatient  Remains inpatient appropriate because:Unsafe d/c plan and Inpatient level of care appropriate due to severity of illness   Dispo: The patient is from: ALF              Anticipated d/c is to: SNF              Anticipated d/c date is: 3 days              Patient currently is not medically stable to d/c.           Subjective:   Interval events noted.  Patient is lethargic and nonverbal and unable to provide any history.  His son, Gerald Stabs, is at the bedside.  Objective:    Vitals:   03/17/20 0700 03/17/20 0810 03/17/20 1136 03/17/20 1144  BP: (!) 167/111   125/73  Pulse: (!) 105   87  Resp:      Temp: 97.6 F (36.4 C)   97.6 F (36.4 C)  TempSrc: Oral   Oral  SpO2: 97% 98% 99% 99%  Weight:      Height:       No data found.  No intake or output data in the 24 hours ending 03/17/20 1330 Filed Weights   03/14/20 0500 03/15/20 0500 03/17/20 0500  Weight: 80.1 kg 80 kg 87.8 kg    Exam:  GEN: NAD SKIN: Warm and dry EYES: Right-sided gaze ENT: MMM, NG tube in place. CV: RRR PULM: CTA B ABD: soft, ND, NT, +BS CNS: Aphasic, left-sided weakness EXT: No edema or tenderness     Data Reviewed:   I have personally reviewed following labs and imaging studies:  Labs: Labs show the following:   Basic Metabolic Panel: Recent Labs  Lab 03/13/20 0654 03/13/20 1216 03/13/20 1629 03/14/20 0609 03/14/20 0954 03/14/20 1722 03/15/20 0118 03/15/20 1013 03/16/20 0030 03/17/20 0402  NA 142  --   --  145 145  --  146*  --  142 141  K 3.5  --   --  3.5 4.0  --  4.1  --  4.1 4.1  CL 112*  --   --  114*  --   --  115*  --  115* 110  CO2 19*  --   --  20*  --   --  22  --  21* 22  GLUCOSE 120*  --   --  130*  --   --  97  --  142* 149*  BUN 24*  --    --  25*  --   --  24*  --  32*  30*  CREATININE 1.25*  --   --  1.01  --   --  1.01  --  0.88 0.90  CALCIUM 8.3*  --   --  8.5*  --   --  8.6*  --  8.6* 8.6*  MG  --  2.4 2.2 2.2  --  2.0  --  2.0  --   --   PHOS  --  3.1 3.2 3.0  --  3.5  --  3.6  --   --    GFR Estimated Creatinine Clearance: 58.7 mL/min (by C-G formula based on SCr of 0.9 mg/dL). Liver Function Tests: Recent Labs  Lab 03/08/2020 0933  AST 18  ALT 16  ALKPHOS 82  BILITOT 0.9  PROT 5.9*  ALBUMIN 3.4*   No results for input(s): LIPASE, AMYLASE in the last 168 hours. No results for input(s): AMMONIA in the last 168 hours. Coagulation profile Recent Labs  Lab 03/03/2020 0933  INR 1.1    CBC: Recent Labs  Lab 03/16/2020 0933 03/02/2020 1452 03/12/20 0054 03/13/20 0654 03/13/20 1629 03/14/20 0609 03/14/20 0954 03/15/20 0118 03/16/20 0030 03/17/20 0402  WBC 6.0  --  9.1   < > 9.3 8.3  --  8.1 8.3 9.7  NEUTROABS 3.7  --  8.1*  --   --   --   --   --   --   --   HGB 13.0   < > 11.6*   < > 11.1* 10.9* 10.2* 11.1* 11.6* 11.3*  HCT 41.2   < > 35.3*   < > 34.9* 34.6* 30.0* 33.6* 35.7* 35.5*  MCV 93.6  --  91.0   < > 93.6 94.0  --  94.4 94.9 93.9  PLT 173  --  150   < > PLATELET CLUMPS NOTED ON SMEAR, COUNT APPEARS DECREASED 132*  --  133* 131* 154   < > = values in this interval not displayed.   Cardiac Enzymes: No results for input(s): CKTOTAL, CKMB, CKMBINDEX, TROPONINI in the last 168 hours. BNP (last 3 results) No results for input(s): PROBNP in the last 8760 hours. CBG: Recent Labs  Lab 03/16/20 1953 03/16/20 2333 03/17/20 0358 03/17/20 0742 03/17/20 1138  GLUCAP 125* 125* 155* 120* 136*   D-Dimer: No results for input(s): DDIMER in the last 72 hours. Hgb A1c: No results for input(s): HGBA1C in the last 72 hours. Lipid Profile: No results for input(s): CHOL, HDL, LDLCALC, TRIG, CHOLHDL, LDLDIRECT in the last 72 hours. Thyroid function studies: No results for input(s): TSH, T4TOTAL, T3FREE,  THYROIDAB in the last 72 hours.  Invalid input(s): FREET3 Anemia work up: No results for input(s): VITAMINB12, FOLATE, FERRITIN, TIBC, IRON, RETICCTPCT in the last 72 hours. Sepsis Labs: Recent Labs  Lab 03/14/20 0609 03/15/20 0118 03/16/20 0030 03/17/20 0402  WBC 8.3 8.1 8.3 9.7    Microbiology Recent Results (from the past 240 hour(s))  Resp panel by RT-PCR (RSV, Flu A&B, Covid) Nasopharyngeal Swab     Status: None   Collection Time: 03/05/2020 12:18 PM   Specimen: Nasopharyngeal Swab; Nasopharyngeal(NP) swabs in vial transport medium  Result Value Ref Range Status   SARS Coronavirus 2 by RT PCR NEGATIVE NEGATIVE Final    Comment: (NOTE) SARS-CoV-2 target nucleic acids are NOT DETECTED.  The SARS-CoV-2 RNA is generally detectable in upper respiratory specimens during the acute phase of infection. The lowest concentration of SARS-CoV-2 viral copies this assay can detect is 138 copies/mL. A negative result does not preclude  SARS-Cov-2 infection and should not be used as the sole basis for treatment or other patient management decisions. A negative result may occur with  improper specimen collection/handling, submission of specimen other than nasopharyngeal swab, presence of viral mutation(s) within the areas targeted by this assay, and inadequate number of viral copies(<138 copies/mL). A negative result must be combined with clinical observations, patient history, and epidemiological information. The expected result is Negative.  Fact Sheet for Patients:  EntrepreneurPulse.com.au  Fact Sheet for Healthcare Providers:  IncredibleEmployment.be  This test is no t yet approved or cleared by the [place] FDA and  has been authorized for detection and/or diagnosis of SARS-CoV-2 by FDA under an Emergency Use Authorization (EUA). This EUA will remain  in effect (meaning this test can be used) for the duration of the COVID-19 declaration  under Section 564(b)(1) of the Act, 21 U.S.C.section 360bbb-3(b)(1), unless the authorization is terminated  or revoked sooner.       Influenza A by PCR NEGATIVE NEGATIVE Final   Influenza B by PCR NEGATIVE NEGATIVE Final    Comment: (NOTE) The Xpert Xpress SARS-CoV-2/FLU/RSV plus assay is intended as an aid in the diagnosis of influenza from Nasopharyngeal swab specimens and should not be used as a sole basis for treatment. Nasal washings and aspirates are unacceptable for Xpert Xpress SARS-CoV-2/FLU/RSV testing.  Fact Sheet for Patients: EntrepreneurPulse.com.au  Fact Sheet for Healthcare Providers: IncredibleEmployment.be  This test is not yet approved or cleared by the [place] FDA and has been authorized for detection and/or diagnosis of SARS-CoV-2 by FDA under an Emergency Use Authorization (EUA). This EUA will remain in effect (meaning this test can be used) for the duration of the COVID-19 declaration under Section 564(b)(1) of the Act, 21 U.S.C. section 360bbb-3(b)(1), unless the authorization is terminated or revoked.     Resp Syncytial Virus by PCR NEGATIVE NEGATIVE Final    Comment: (NOTE) Fact Sheet for Patients: EntrepreneurPulse.com.au  Fact Sheet for Healthcare Providers: IncredibleEmployment.be  This test is not yet approved or cleared by the [place] FDA and has been authorized for detection and/or diagnosis of SARS-CoV-2 by FDA under an Emergency Use Authorization (EUA). This EUA will remain in effect (meaning this test can be used) for the duration of the COVID-19 declaration under Section 564(b)(1) of the Act, 21 U.S.C. section 360bbb-3(b)(1), unless the authorization is terminated or revoked.  Performed at [facility], [place] [address], [place], [place] [address]   MRSA PCR Screening     Status: None   Collection Time: 03/10/2020  1:28 PM   Specimen:  Nasopharyngeal  Result Value Ref Range Status   MRSA by PCR NEGATIVE NEGATIVE Final    Comment:        The GeneXpert MRSA Assay (FDA approved for NASAL specimens only), is one component of a comprehensive MRSA colonization surveillance program. It is not intended to diagnose MRSA infection nor to guide or monitor treatment for MRSA infections. Performed at [facility], [place] [address], [place], [place] [address]   Culture, respiratory (non-expectorated)     Status: None   Collection Time: 03/13/2020  1:52 PM   Specimen: Tracheal Aspirate; Respiratory  Result Value Ref Range Status   Specimen Description TRACHEAL ASPIRATE  Final   Special Requests NONE  Final   Gram Stain   Final    RARE WBC PRESENT, PREDOMINANTLY PMN NO ORGANISMS SEEN    Culture   Final    RARE Normal respiratory flora-no Staph aureus  or Pseudomonas seen Performed at [facility], [place] [address], [place],  [address]    Report Status 03/13/2020 FINAL  Final    Procedures and diagnostic studies:  No results found.             LOS: 6 days   Emme Rosenau  [facility]   Pager on [email or web address]. If 7PM-7AM, please contact night-coverage at [email or web address]     03/17/2020, 1:30 PM

## 2020-03-17 NOTE — Progress Notes (Signed)
  Speech Language Pathology Treatment: Dysphagia  Patient Details Name: Jeffrey Ashley MRN: [id number] DOB: 1926/08/01 Today's Date: 03/17/2020 Time: 8453-6468 SLP Time Calculation (min) (ACUTE ONLY): 9.8 min  Assessment / Plan / Recommendation Clinical Impression  Pt's presentation is consistent with yesterday's performance on initial swallow assessment.  Pt's son, Jeffrey Ashley, was at bedside.  Jeffrey Ashley is not demonstrating spontaneous swallowing, so there is a build-up of secretions which are audible.  Provided with max tactile/verbal/visual cues to open eyes, attend to ice, and activate a swallow response.  Pt was alert during bath prior to my arrival according to [place], but was quite sleepy during our session.  Pt received ice, holding it orally with cues needed to chew and swallow.  There was likely passive spillage into the throat, eliciting an eventual swallow, followed by congested coughing and requiring extensive oral suctioning, removing thick secretions.  Pt was able to hold [place] with right hand and do his own cursory suctioning, requiring assist.    Jeffrey Ashley is still not sufficiently alert to consume any POs safely.  Prognosis for meaningful recovery of swallow remains uncertain.  SLP will follow closely for ongoing assessment of readiness for POs vs. MBS.    HPI HPI: Jeffrey Ashley is 86 yo M w/ dementia presenting w/ acute left sided weakness on code stroke. Received IV TPA and right MCA M1 re-vascularized by neuro ir. Remained intubated due to suspected aspiration. MRI on 12/12 post-intervention showing large right MCA infarct. CXR 12/15: "There is improved aeration of the lungs without acute airspace opacity."      SLP Plan  Continue with current plan of care       Recommendations  Diet recommendations: NPO                Oral Care Recommendations: Oral care QID Follow up Recommendations: Other (comment) (tba) SLP Visit Diagnosis: Dysphagia, oropharyngeal phase  (R13.12) Plan: Continue with current plan of care       GO              Jeffrey Fleek L. Samson Frederic, MA CCC/SLP Acute Rehabilitation Services Office number [phone] Pager [phone]   Jeffrey Ashley 03/17/2020, 10:06 AM

## 2020-03-17 NOTE — Progress Notes (Addendum)
Physical Therapy Treatment Patient Details Name: Jeffrey Ashley MRN: [id number] DOB: 1927/02/08 Today's Date: 03/17/2020    History of Present Illness Patient is a 86 y/o male who presents with left sided weakness. 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 NIH:18. s/p tpA and complete revascularization right MCA 12/11. Remained intubated due to concerns for aspiration. Extubated 12/14. PMH includes HTN and Dysrhythmia.    PT Comments    Pt very lethargic today. Unable to arouse for active participation in session. Bed placed in chair position. ROM performed BUE/LE. Minimal active movement noted RUE/LE. Son present in room and reports pt had a bath this AM and has been sleeping soundly since. PT to continue per POC.    Follow Up Recommendations  SNF;Supervision for mobility/OOB     Equipment Recommendations  None recommended by PT    Recommendations for Other Services       Precautions / Restrictions Precautions Precautions: Fall;Other (comment) Precaution Comments: left hemi, left neglect, cortrak    Mobility  Bed Mobility Overal bed mobility: Needs Assistance             General bed mobility comments: total assist repositioning in bed  Transfers                    Ambulation/Gait                 Stairs             Wheelchair Mobility    Modified Rankin (Stroke Patients Only) Modified Rankin (Stroke Patients Only) Pre-Morbid Rankin Score: Moderately severe disability Modified Rankin: Severe disability     Balance                                            Cognition Arousal/Alertness: Lethargic Behavior During Therapy: Flat affect Overall Cognitive Status: Difficult to assess                                 General Comments: Very lethargic today. Sleeping through session. Minimal eye opening. Son present in room. Reports pt had a bath this AM and has been sleeping soundly since.      Exercises Other  Exercises Other Exercises: PROM LUE/LE, P/AAROM RUE/LE    General Comments        Pertinent Vitals/Pain Pain Assessment: Faces Faces Pain Scale: No hurt    Home Living                      Prior Function            PT Goals (current goals can now be found in the care plan section) Acute Rehab PT Goals Patient Stated Goal: not stated Progress towards PT goals: Progressing toward goals    Frequency    Min 3X/week      PT Plan Current plan remains appropriate    Co-evaluation              AM-PAC PT "6 Clicks" Mobility   Outcome Measure  Help needed turning from your back to your side while in a flat bed without using bedrails?: Total Help needed moving from lying on your back to sitting on the side of a flat bed without using bedrails?: Total Help needed moving to and from a bed to a  chair (including a wheelchair)?: Total Help needed standing up from a chair using your arms (e.g., wheelchair or bedside chair)?: Total Help needed to walk in hospital room?: Total Help needed climbing 3-5 steps with a railing? : Total 6 Click Score: 6    End of Session   Activity Tolerance: Patient limited by lethargy;Patient limited by fatigue Patient left: in bed;with call bell/phone within reach;with bed alarm set;with family/visitor present Nurse Communication: Mobility status PT Visit Diagnosis: Hemiplegia and hemiparesis Hemiplegia - Right/Left: Left Hemiplegia - dominant/non-dominant: Non-dominant Hemiplegia - caused by: Cerebral infarction     Time: 1601-0932 PT Time Calculation (min) (ACUTE ONLY): 16 min  Charges:  $Therapeutic Exercise: 8-22 mins                     Aida Raider, PT  Office # [phone] Pager [phone]    Ilda Foil 03/17/2020, 12:22 PM

## 2020-03-17 NOTE — Evaluation (Signed)
Speech Language Pathology Evaluation Patient Details Name: Damoni Causby MRN: [id number] DOB: [birth year] Today's Date: 03/17/2020 Time: 0950-1005 SLP Time Calculation (min) (ACUTE ONLY): 15 min  Problem List:  Patient Active Problem List   Diagnosis Date Noted  . Protein-calorie malnutrition, severe 03/14/2020  . Stroke (HCC) 03/21/2020  . Stroke (cerebrum) (HCC) 03/12/2020  . Middle cerebral artery embolism, right 03/08/2020  . Glaucoma 12/09/2019  . Atrial fibrillation, chronic (HCC) 08/17/2019  . Essential hypertension 08/17/2019  . Hypothyroidism 08/17/2019   Past Medical History:  Past Medical History:  Diagnosis Date  . 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 Dysrhythmia    AFIB  . HOH (hard of hearing)   . Hypertension   . Hypothyroidism   . Nontraumatic subdural hematoma (HCC)   . 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 APPENDECTOMY    . BRAIN SURGERY     CRANIO  . CATARACT EXTRACTION W/PHACO Left 03/06/2017   Procedure: CATARACT EXTRACTION PHACO AND INTRAOCULAR LENS PLACEMENT (IOC);  Surgeon: Nevada Crane, MD;  Location: [facility] ORS;  Service: Ophthalmology;  Laterality: Left;  [place] 01:10.8 AP% 18.8 CDE 13.39 FLUID PACK LOT # [id number]  . CATARACT EXTRACTION W/PHACO Right 08/16/2019   Procedure: CATARACT EXTRACTION PHACO AND INTRAOCULAR LENS PLACEMENT (IOC) RIGHT ISTENT INJ 8.84  01:03.5;  Surgeon: Nevada Crane, MD;  Location: [facility];  Service: Ophthalmology;  Laterality: Right;  . CHOLECYSTECTOMY    . INTRAMEDULLARY (IM) NAIL INTERTROCHANTERIC Left 08/18/2019   Procedure: INTRAMEDULLARY (IM) NAIL INTERTROCHANTRIC;  Surgeon: Lyndle Herrlich, MD;  Location: [facility] ORS;  Service: Orthopedics;  Laterality: Left;  . IR CT HEAD LTD  03/03/2020  . IR PERCUTANEOUS ART THROMBECTOMY/INFUSION INTRACRANIAL INC DIAG ANGIO  03/01/2020  . Kidney Tumor Removal    . RADIOLOGY  WITH ANESTHESIA N/A 03/22/2020   Procedure: IR WITH ANESTHESIA;  Surgeon: Radiologist, Medication, MD;  Location: [facility] OR;  Service: Radiology;  Laterality: N/A;   HPI:  [name] is 86 yo M w/ dementia presenting w/ acute left sided weakness on code stroke. Received IV TPA and right MCA M1 re-vascularized by neuro ir. Remained intubated due to suspected aspiration. MRI on 12/12 post-intervention showing large right MCA infarct. CXR 12/15: "There is improved aeration of the lungs without acute airspace opacity."   Assessment / Plan / Recommendation Clinical Impression  Pt with lethargy, inhibiting full participation in assessment.  He maintained eyes closed for most of session.  He demonstrated occasional phonation on command (single vowel sounds), but was unable to produce any meaningful speech. He inconsistently used head nods/shakes to communicate yes/no. He was able to follow one-step commands in context.  He was attempting to write with a pen/paper, but he kept his eyes closed and output was not legible and c/b perseverative letter formation.  An alphabet board was provided for improved clarity - pt required max cues for accurate letter identification - he did attempt to point to letters, but fatigue was interfering with performance.  When asked to count to ten in an effort to elicit speech, he presented his fingers in chronological counting order.  Pt is clearly comprehending more than he can convey at this time.  Recommend ongoing acute care SLP f/u to facilitate basic communication/making needs known.  Discussed strategies with his son, Thayer Ohm, who was at bedside.    SLP Assessment  SLP Recommendation/Assessment: Patient needs continued Speech  [place] [facility] Services SLP Visit Diagnosis: Dysarthria and anarthria (R47.1)    Follow Up Recommendations  Other (comment) (tba)    Frequency and Duration min 2x/week  2 weeks      SLP Evaluation Cognition  Overall Cognitive Status:  Impaired/Different from baseline Arousal/Alertness: Lethargic Attention: Focused Focused Attention: Impaired Focused Attention Impairment: Verbal basic       Comprehension  Auditory Comprehension Overall Auditory Comprehension: Impaired Yes/No Questions: Impaired Basic Biographical Questions:  (inconsistent responses) Commands: Impaired One Step Basic Commands: 50-74% accurate Reading Comprehension Reading Status: Not tested    Expression Expression Primary Mode of Expression: Nonverbal - gestures Verbal Expression Overall Verbal Expression: Impaired Written Expression Written Expression:  (perseveratory)   Oral / Motor  Oral Motor/Sensory Function Overall Oral Motor/Sensory Function: Severe impairment Facial ROM: Reduced right;Reduced left Facial Symmetry: Abnormal symmetry left Lingual ROM: Reduced right;Reduced left Lingual Strength: Reduced Motor Speech Overall Motor Speech: Impaired Articulation:  (no attempts)   GO                    [name] 03/17/2020, 10:26 AM  Marchelle Folks L. Samson Frederic, MA CCC/SLP Acute Rehabilitation Services Office number [phone] Pager [phone]

## 2020-03-17 NOTE — TOC Progression Note (Signed)
Transition of Care [facility]) - Progression Note    Patient Details  Name: Jeffrey Ashley MRN: [id number] Date of Birth: 03-Dec-1926  Transition of Care [facility]) CM/SW Contact  Okey Dupre Lazaro Arms, LCSW Phone Number: 03/17/2020, 2:08 PM  Clinical Narrative:  TOC continuing to follow for disposition needs. MD's 12/17 note indicates that anticipated discharge is 3 days as patient not currently medically stable for discharge. CSW will continue to follow, provide CSW intervention services as needed and facilitate discharge to a facility once medically stable.     Expected Discharge Plan: Assisted Living Barriers to Discharge: Continued Medical Work up  Expected Discharge Plan and Services Expected Discharge Plan: Assisted Living   Discharge Planning Services: CM Consult                                         Social Determinants of Health (SDOH) Interventions  No SDOH interventions requested or needed at discharge  Readmission Risk Interventions No flowsheet data found.

## 2020-03-18 ENCOUNTER — Inpatient Hospital Stay (HOSPITAL_COMMUNITY): Payer: Medicare Other

## 2020-03-18 DIAGNOSIS — I10 Essential (primary) hypertension: Secondary | ICD-10-CM

## 2020-03-18 LAB — BASIC METABOLIC PANEL
Anion gap: 9 (ref 5–15)
BUN: 31 mg/dL — ABNORMAL HIGH (ref 8–23)
CO2: 23 mmol/L (ref 22–32)
Calcium: 8.5 mg/dL — ABNORMAL LOW (ref 8.9–10.3)
Chloride: 106 mmol/L (ref 98–111)
Creatinine, Ser: 0.85 mg/dL (ref 0.61–1.24)
GFR, Estimated: >60 mL/min (ref >60)
Glucose, Bld: 149 mg/dL — ABNORMAL HIGH (ref 70–99)
Potassium: 4.2 mmol/L (ref 3.5–5.1)
Sodium: 138 mmol/L (ref 135–145)

## 2020-03-18 LAB — GLUCOSE, CAPILLARY
Glucose-Capillary: 123 mg/dL — ABNORMAL HIGH (ref 70–99)
Glucose-Capillary: 132 mg/dL — ABNORMAL HIGH (ref 70–99)
Glucose-Capillary: 134 mg/dL — ABNORMAL HIGH (ref 70–99)
Glucose-Capillary: 137 mg/dL — ABNORMAL HIGH (ref 70–99)
Glucose-Capillary: 141 mg/dL — ABNORMAL HIGH (ref 70–99)
Glucose-Capillary: 147 mg/dL — ABNORMAL HIGH (ref 70–99)
Glucose-Capillary: 166 mg/dL — ABNORMAL HIGH (ref 70–99)

## 2020-03-18 LAB — CBC
HCT: 37.3 % — ABNORMAL LOW (ref 39.0–52.0)
Hemoglobin: 11.8 g/dL — ABNORMAL LOW (ref 13.0–17.0)
MCH: 30 pg (ref 26.0–34.0)
MCHC: 31.6 g/dL (ref 30.0–36.0)
MCV: 94.9 fL (ref 80.0–100.0)
Platelets: 161 10*3/uL (ref 150–400)
RBC: 3.93 MIL/uL — ABNORMAL LOW (ref 4.22–5.81)
RDW: 14 % (ref 11.5–15.5)
WBC: 8.6 10*3/uL (ref 4.0–10.5)
nRBC: 0 % (ref 0.0–0.2)

## 2020-03-18 MED ORDER — ORAL CARE MOUTH RINSE
15.0000 mL | OROMUCOSAL | Status: DC
  Administered 2020-03-19 – 2020-03-26 (×38): 15 mL via OROMUCOSAL

## 2020-03-18 MED ORDER — GLYCOPYRROLATE 0.2 MG/ML IJ SOLN
0.2000 mg | Freq: Four times a day (QID) | INTRAMUSCULAR | Status: DC
  Administered 2020-03-18 – 2020-03-19 (×2): 0.2 mg via SUBCUTANEOUS
  Filled 2020-03-18 (×2): qty 1

## 2020-03-18 NOTE — TOC Initial Note (Signed)
Transition of Care [facility]) - Initial/Assessment Note    Patient Details  Name: Jeffrey Ashley MRN: [id number] Date of Birth: 09-01-1926  Transition of Care [facility]) CM/SW Contact:    Mearl Latin, LCSW Phone Number: 03/18/2020, 11:48 AM  Clinical Narrative:                 CSW spoke with patient's sons Dr. Tilda Franco and Franky Macho (referred me to Dr. Laural Benes as primary contact). Patient resides at [facility] and they are requesting patient go to the rehab SNF side at [facility] once patient is medically ready for discharge. CSW will get in touch with [facility] regarding bed availability.    Expected Discharge Plan: Skilled Nursing Facility Barriers to Discharge: Continued Medical Work up   Patient Goals and CMS Choice Patient states their goals for this hospitalization and ongoing recovery are:: Rehab CMS Medicare.gov Compare Post Acute Care list provided to:: Patient Represenative (must comment) Choice offered to / list presented to : Adult Children  Expected Discharge Plan and Services Expected Discharge Plan: Skilled Nursing Facility In-house Referral: Clinical Social Work Discharge Planning Services: CM Consult Post Acute Care Choice: Skilled Nursing Facility Living arrangements for the past 2 months: Assisted Living Facility                                      Prior Living Arrangements/Services Living arrangements for the past 2 months: Assisted Living Facility Lives with:: Facility Resident Patient language and need for interpreter reviewed:: Yes Do you feel safe going back to the place where you live?: Yes      Need for Family Participation in Patient Care: Yes (Comment) Care giver support system in place?: Yes (comment)   Criminal Activity/Legal Involvement Pertinent to Current Situation/Hospitalization: No - Comment as needed  Activities of Daily Living      Permission Sought/Granted Permission sought to share information with : Facility Contact  Representative,Family Supports Permission granted to share information with : No  Share Information with NAME: Dr. Renae Fickle Johnson/Kathy  Permission granted to share info w AGENCY: [facility]  Permission granted to share info w Relationship: Son/Niece  Permission granted to share info w Contact Information: [phone]  Emotional Assessment Appearance:: Appears stated age Attitude/Demeanor/Rapport: Unable to Assess,Intubated (Following Commands or Not Following Commands) Affect (typically observed): Unable to Assess Orientation: : Oriented to Self Alcohol / Substance Use: Not Applicable Psych Involvement: No (comment)  Admission diagnosis:  Respiration abnormal [R06.9] Stroke (HCC) [I63.9] Stroke (cerebrum) (HCC) [I63.9] Cerebrovascular accident (CVA), unspecified mechanism (HCC) [I63.9] Middle cerebral artery embolism, right [I66.01] Patient Active Problem List   Diagnosis Date Noted  . Protein-calorie malnutrition, severe 03/14/2020  . Stroke (HCC) 2020/04/08  . Stroke (cerebrum) (HCC) 04-08-20  . Middle cerebral artery embolism, right 2020-04-08  . Glaucoma 12/09/2019  . Atrial fibrillation, chronic (HCC) 08/17/2019  . Essential hypertension 08/17/2019  . Hypothyroidism 08/17/2019   PCP:  Karie Schwalbe, MD Pharmacy:   [facility] [place], [place] - [address] [place] [address] Phone: [phone] Fax: [phone]     Social Determinants of Health (SDOH) Interventions    Readmission Risk Interventions No flowsheet data found.

## 2020-03-18 NOTE — Progress Notes (Signed)
STROKE TEAM PROGRESS NOTE   INTERVAL HISTORY No family at bedside. Patient is lethargic, sleeping, aroses to light stim and shakes my hand with his right hands. Follows comannds on the right.   OBJECTIVE Vitals:   03/18/20 0010 03/18/20 0500 03/18/20 0628 03/18/20 0735  BP: 121/72  (!) 163/98 (!) 157/84  Pulse: 80  88 90  Resp: 18  20 20   Temp: 99 F (37.2 C)  99.7 F (37.6 C) 99 F (37.2 C)  TempSrc: Oral  Axillary Oral  SpO2: 99%  98% 97%  Weight:  89.7 kg    Height:       CBC:  Recent Labs  Lab 03/12/20 0054 03/13/20 0654 03/17/20 0402 03/18/20 0247  WBC 9.1   < > 9.7 8.6  NEUTROABS 8.1*  --   --   --   HGB 11.6*   < > 11.3* 11.8*  HCT 35.3*   < > 35.5* 37.3*  MCV 91.0   < > 93.9 94.9  PLT 150   < > 154 161   < > = values in this interval not displayed.   Basic Metabolic Panel:  Recent Labs  Lab 03/14/20 1722 03/15/20 0118 03/15/20 1013 03/16/20 0030 03/17/20 0402 03/18/20 0247  NA  --    < >  --    < > 141 138  K  --    < >  --    < > 4.1 4.2  CL  --    < >  --    < > 110 106  CO2  --    < >  --    < > 22 23  GLUCOSE  --    < >  --    < > 149* 149*  BUN  --    < >  --    < > 30* 31*  CREATININE  --    < >  --    < > 0.90 0.85  CALCIUM  --    < >  --    < > 8.6* 8.5*  MG 2.0  --  2.0  --   --   --   PHOS 3.5  --  3.6  --   --   --    < > = values in this interval not displayed.   Lipid Panel:     Component Value Date/Time   CHOL 123 03/12/2020 0054   TRIG 102 03/14/2020 0609   HDL 34 (L) 03/12/2020 0054   CHOLHDL 3.6 03/12/2020 0054   VLDL 18 03/12/2020 0054   LDLCALC 71 03/12/2020 0054   HgbA1c:  Lab Results  Component Value Date   HGBA1C 5.8 (H) 03/12/2020   Urine Drug Screen:     Component Value Date/Time   LABOPIA NEGATIVE 03/13/2012 1900   COCAINSCRNUR NEGATIVE 03/13/2012 1900   LABBENZ NEGATIVE 03/13/2012 1900   AMPHETMU NEGATIVE 03/13/2012 1900   THCU NEGATIVE 03/13/2012 1900   LABBARB NEGATIVE 03/13/2012 1900    Alcohol  Level No results found for: ETH  IMAGING  CT HEAD CODE STROKE WO CONTRAST 03/07/2020 1. No acute intracranial process. Mild cerebral atrophy and chronic microvascular ischemic changes.  2. ASPECTS is 10  3. Remote lacunar insult versus dilated perivascular spaces involving the right basal ganglia, posterior limb of the right internal capsule and left insula.   CT Code Stroke CTA Head W/WO contrast CT Code Stroke CTA Neck W/WO contrast 03/03/2020   Right M1 segment large vessel occlusion.  No large vessel occlusion within the neck. 50% right and 30% left proximal ICA luminal narrowing. Mild-to-moderate left vertebral artery origin narrowing. Mild narrowing of the bilateral V4 segments, right P3 and left P2 segments.   MR BRAIN WO CONTRAST MR ANGIO HEAD WO CONTRAST 03/12/2020 Sequela of acute/subacute right MCA territory infarct. Small acute right cerebellar insult. Interval revascularization of the right MCA. No high-grade intracranial narrowing or large vessel occlusion. Mild narrowing of the right V4, right P2 and left M2 segments.   DG Chest Port 1 View 03/15/2020 1.  Interval endotracheal extubation. 2. There is improved aeration of the lungs without acute airspace opacity. 3.  Cardiomegaly. 03/12/2020 Increased bibasilar opacities. Slight interval retraction of ETT.  2020/04/01 1. Endotracheal tube distal tip in the midthoracic trachea.  2. Bibasilar subsegmental atelectasis.  DG Chest Port 1 View 03/18/2020 Impression: 1. Stable mild cardiomegaly without pulmonary edema.  2. Stable minimal bibasilar atelectasis.  DG Abd Portable 1V 03/13/2020 Orogastric tube tip and side port in distal stomach. No bowel obstruction or free air evident. Apparent staghorn calculi in left kidney, stable. Small pleural effusions bilaterally with bibasilar atelectasis.  01-Apr-2020 Enteric tube with tip in the distal esophagus, side-port in the mid esophagus. Recommend advancement of greater than  10 cm for optimal placement.   ECHOCARDIOGRAM COMPLETE 04/01/20 1. Left ventricular ejection fraction, by estimation, is 50 to 55%. The left ventricle has low normal function. The left ventricle has no regional wall motion abnormalities. There is severe concentric left ventricular hypertrophy. Left ventricular diastolic parameters are indeterminate.   2. Right ventricular systolic function is normal. The right ventricular size is normal.   3. Left atrial size was severely dilated.   4. Right atrial size was severely dilated.   5. A small pericardial effusion is present. The pericardial effusion is surrounding the apex.   6. The mitral valve is grossly normal. Trivial mitral valve regurgitation.   7. The aortic valve is tricuspid. There is mild calcification of the aortic valve. Aortic valve regurgitation is mild. No aortic stenosis is present.   8. The inferior vena cava is normal in size with greater than 50% respiratory variability, suggesting right atrial pressure of 3 mmHg. Comparison(s): No prior Echocardiogram.   ECG - atrial fibrillation - ventricular response 81 BPM (See cardiology reading for complete details)   PHYSICAL EXAM   Temp:  [97.6 F (36.4 C)-99.7 F (37.6 C)] 99 F (37.2 C) (12/18 0735) Pulse Rate:  [78-90] 90 (12/18 0735) Resp:  [18-20] 20 (12/18 0735) BP: (121-163)/(72-98) 157/84 (12/18 0735) SpO2:  [97 %-99 %] 97 % (12/18 0735) Weight:  [89.7 kg] 89.7 kg (12/18 0500)  General - Well nourished, well developed, lethargic.  Ophthalmologic - fundi not visualized due to noncooperation.  Cardiovascular - irregularly irregular heart rate and rhythm.  Neuro - lethargic, eyes open with voice or liht stim, able to follow all simple midline and right hand and foot commands. However, nonverbal with anarthria. With eye opening, eyes in right gaze preference position, however able to cross midline, although left gaze incomplete. Not blinking to visual threat on the left,  blinking on the right, pupils equal bilaterally reactive to light. Left facial droop, tongue midline in mouth but protrusion not cooperative. RUE spontaneous purposeful movement able to against gravity, able to follow commands on the right hand. RLE proximal 2/5 and knee flexion 3+/5 on pain, LUE flaccid, LLE mild withdraw movement of the knee. DTR 1+ and no babinski. Sensation, coordination not cooperative and  gait not tested.   ASSESSMENT/PLAN [name] is a 86 y.o. male with history of hypertension, hypothyroidism, hearing impaired, subdural hematoma in 2013 and atrial fibrillation not on anticoagulation who presents with dysarthria and left-sided weakness. The pt vomited en route and there was concern for possible aspiration.  The patient received IV t-PA Saturday Mar 20, 2020 at 9:45 AM. IR 03/20/20 at 11:40 AM - S/P complete revascularization of RT MCA M1 occlusion achieving a TICI 3 revascularization.  Stroke: Rt MCA and small R cerebellar infarcts - embolic - likely from atrial fibrillation without anticoagulation  CT Head -  No acute intracranial process. Remote lacunar insult versus dilated perivascular spaces involving the right basal ganglia, posterior limb of the right internal capsule and left insula.   CTA H&N - Right M1 segment large vessel occlusion. No large vessel occlusion within the neck. 50% right and 30% left proximal ICA luminal narrowing. Mild-to-moderate left vertebral artery origin narrowing. Mild narrowing of the bilateral V4 segments, right P3 and left P2 segments.  MRI / MRA head - R large extensive MCA infarct. Small R cerebellar infarct. Interval revascularization R MCA. Mild narrowing R V4, R P2, L M2  2D Echo - EF 50 - 55%. No cardiac source of emboli identified.   [employer] Virus 2 - negative  LDL - 71  HgbA1c - 5.8  VTE prophylaxis - Heparin 5000 units sq tid   aspirin 81 mg daily prior to admission, now on aspirin 325 mg daily   Therapy  recommendations: SNF  Disposition:  Pending - if respiratory / swallow condition no improvement over the weekend, may consider palliative care consult  Chronic Afib  Chronic afib but not sure about afib burden   Was on coumadin in the past, off after SDH  Never restarted due to advanced age, spontaneous SDH, gross hematuria  Follow with Dr. Marge Duncans at [place] [facility]  Currently rate controlled  Now on ASA. If pt made significant recovery, may need to discuss with Dr. Larey Dresser at [place] [facility] regarding [facility]. Son also wants to be involved in decision making at that time.  Acute Hypoxic Respiratory failure  Intubated on vent ->now extubated  Off sedation  CCM on board  Tolerating post extubation, however still has difficulty with oral secretion  Chest PT  3% saline nebulization  NT suctioning as needed  CXR 12/18 - Stable mild cardiomegaly without pulmonary edema. Stable minimal bibasilar atelectasis  Aspiration pneumonia  Vomited x2 before intubation  T Max - 100.4->afebrile->100.4->99.7  IV Unasyn started 03-20-2020-> off now  [facility] - 6.0->9.1->10.3->8.1->8.3->9.7->8.6  Respiratory cultures - normal respiratory flora.  CXR 12/18 - Stable mild cardiomegaly without pulmonary edema. Stable minimal bibasilar atelectasis.  Hypertension  Home BP meds: Coreg  Current BP meds: Resumed Coreg   Stable . BP goal post tPA < 180/105, gradually normalize in 5-7 days  . Long-term BP goal normotensive  Hyperlipidemia  Home Lipid lowering medication: none   LDL 71, goal < 70  Put on lipitor 20  Continue statin on discharge  Dysphagia  N.p.o.  s/p cortrak 12/15  On TF and FW - Osmolite / ProSource  Speech to follow  Pt remains NPO per speech therapy.  Other Stroke Risk Factors  Advanced age  Previous ETOH use  Hx of R SDH 2013 s/p craniotomy  Possible remote lacunar by imaging  Other Active Problems, Findings and Recommendations  Code  status - Limited code CKD - stage 3a - creatinine - 1.26->1.21->1.25->1.01->0.88->0.90->0.85 On Vit D  Hypothyroid on synthroid On Xalatan    Hematuria - catheter related trauma - monitoring  Hospital day # 7  Personally examined patient and images, and have participated in and made any corrections needed to history, physical, neuro exam,assessment and plan as stated above.  I have personally obtained the history, evaluated lab date, reviewed imaging studies and agree with radiology interpretations.    Naomie Dean, MD Stroke Neurology  I spent 15 minutes of face-to-face and non-face-to-face time with patient. This included prechart review, lab review, study review, order entry, electronic health record documentation, patient education on the different diagnostic and therapeutic options, counseling and coordination of care, risks and benefits of management, compliance, or risk factor reduction   To contact Stroke Continuity provider, please refer to [email or web address]. After hours, contact General Neurology

## 2020-03-18 NOTE — Plan of Care (Signed)
  Problem: Education: Goal: Knowledge of disease or condition will improve Outcome: Not Progressing Goal: Knowledge of secondary prevention will improve Outcome: Not Progressing   Problem: Coping: Goal: Will verbalize positive feelings about self Outcome: Not Progressing   Problem: Self-Care: Goal: Verbalization of feelings and concerns over difficulty with self-care will improve Outcome: Not Progressing   Problem: Nutrition: Goal: Risk of aspiration will decrease Outcome: Not Progressing

## 2020-03-18 NOTE — Progress Notes (Signed)
[facility] Consultation Progress Note  Patient: Jeffrey Ashley IOM:[id number]   PCP: Venia Carbon, MD DOB: 1926-04-26   DOA: 03/14/2020   DOS: 03/18/2020   Date of Service: the patient was seen and examined on 03/18/2020 Primary service: Melvenia Beam, MD    Brief hospital course: Past medical history of chronic A. fib, dementia, HTN, hypothyroidism, BPH, SDH.  Presents with left-sided weakness found to have MCA stroke SP TPA and thrombectomy on 03/21/2020. Currently further plan is monitor for improvement in mentation.  Subjective: Limited.  Patient noninteractive.  Not following any commands.  No acute events overnight.  Assessment and Plan: 1.  Large MCA stroke Small cerebral stroke. SP TPA and thrombectomy on 12/11 On aspirin and Lipitor. Neurology is the primary team right now.  Management per neurology.  2.  Dysphagia After the stroke. Currently has core track. If the swallowing does not improve patient will benefit from palliative care consultation. Most likely patient will require long-term feeding tube although given persistent risk for aspiration PEG tube will not be a good idea as it will not prevent patient from aspirating.  3.  Chronic A. Fib Patient was on anticoagulation with warfarin. Patient developed spontaneous subdural hemorrhage therefore anticoagulation was discontinued. Now back on aspirin. Outpatient follow-up with cardiology for further discussion regarding reinitiation of aspirin although given the age and prior history conservative measures are appropriate.  Acute hypoxic respiratory failure: Extubated on 03/14/2020  Aspiration pneumonia: Completed IV Unasyn.  Hypertension: Continue antihypertensives  Hyperlipidemia: Continue Lipitor  Recommendation on discharge: To be determined   Diet: NPO  DVT Prophylaxis: Subcutaneous Lovenox  Family Communication: no family was present at bedside, at the time of interview.    Disposition: We will continue to follow the patient.   Other Consultants: PCCM  Procedures: Echocardiogram   S/p TPA and thrombectomy on 03/10/2020  Intubated on 03/09/2020 and extubated on 03/14/2020  Antibiotics: Anti-infectives (From admission, onward)   Start     Dose/Rate Route Frequency Ordered Stop   03/09/2020 1445  Ampicillin-Sulbactam (UNASYN) 3 g in sodium chloride 0.9 % 100 mL IVPB  Status:  Discontinued        3 g 200 mL/hr over 30 Minutes Intravenous Every 8 hours 03/07/2020 1358 03/14/20 0823   03/24/2020 1026  ceFAZolin (ANCEF) 2-4 GM/100ML-% IVPB       Note to Pharmacy: Novella Rob   : cabinet override      03/16/2020 1026 03/24/2020 2229      Objective: Physical Exam: Vitals:   03/18/20 1155 03/18/20 1217 03/18/20 1534 03/18/20 1538  BP:  (!) 146/96  (!) 157/117  Pulse:  85  91  Resp:  18    Temp:  97.9 F (36.6 C)  98.2 F (36.8 C)  TempSrc:  Oral  Oral  SpO2: 95% 97% 95% 91%  Weight:      Height:        Intake/Output Summary (Last 24 hours) at 03/18/2020 1829 Last data filed at 03/18/2020 0900 Gross per 24 hour  Intake --  Output 1250 ml  Net -1250 ml   Filed Weights   03/15/20 0500 03/17/20 0500 03/18/20 0500  Weight: 80 kg 87.8 kg 89.7 kg   General: Appear in mild distress, no Rash; Oral Mucosa Clear, moist. no Abnormal Neck Mass Or lumps, Conjunctiva normal  Cardiovascular: S1 and S2 Present, no Murmur, Respiratory: good respiratory effort, Bilateral Air entry present and Clear to Auscultation, no Crackles, no wheezes Abdomen: Bowel Sound present, Soft  and no tenderness Extremities: no Pedal edema, no calf tenderness Neurology: lethargic and not oriented to time, place, and person affect unresponsive.  Gait not checked due to patient safety concerns   Data Reviewed: CBC: Recent Labs  Lab 03/12/20 0054 03/13/20 0654 03/14/20 0609 03/14/20 0954 03/15/20 0118 03/16/20 0030 03/17/20 0402 03/18/20 0247  WBC 9.1   < > 8.3  --  8.1 8.3  9.7 8.6  NEUTROABS 8.1*  --   --   --   --   --   --   --   HGB 11.6*   < > 10.9* 10.2* 11.1* 11.6* 11.3* 11.8*  HCT 35.3*   < > 34.6* 30.0* 33.6* 35.7* 35.5* 37.3*  MCV 91.0   < > 94.0  --  94.4 94.9 93.9 94.9  PLT 150   < > 132*  --  133* 131* 154 161   < > = values in this interval not displayed.   Basic Metabolic Panel: Recent Labs  Lab 03/13/20 1216 03/13/20 1629 03/14/20 0609 03/14/20 0954 03/14/20 1722 03/15/20 0118 03/15/20 1013 03/16/20 0030 03/17/20 0402 03/18/20 0247  NA  --   --  145 145  --  146*  --  142 141 138  K  --   --  3.5 4.0  --  4.1  --  4.1 4.1 4.2  CL  --   --  114*  --   --  115*  --  115* 110 106  CO2  --   --  20*  --   --  22  --  21* 22 23  GLUCOSE  --   --  130*  --   --  97  --  142* 149* 149*  BUN  --   --  25*  --   --  24*  --  32* 30* 31*  CREATININE  --   --  1.01  --   --  1.01  --  0.88 0.90 0.85  CALCIUM  --   --  8.5*  --   --  8.6*  --  8.6* 8.6* 8.5*  MG 2.4 2.2 2.2  --  2.0  --  2.0  --   --   --   PHOS 3.1 3.2 3.0  --  3.5  --  3.6  --   --   --    Liver Function Tests: No results for input(s): AST, ALT, ALKPHOS, BILITOT, PROT, ALBUMIN in the last 168 hours. No results for input(s): LIPASE, AMYLASE in the last 168 hours. No results for input(s): AMMONIA in the last 168 hours. Cardiac Enzymes: No results for input(s): CKTOTAL, CKMB, CKMBINDEX, TROPONINI in the last 168 hours. BNP (last 3 results) No results for input(s): BNP in the last 8760 hours. CBG: Recent Labs  Lab 03/18/20 0012 03/18/20 0618 03/18/20 0738 03/18/20 1220 03/18/20 1537  GLUCAP 132* 123* 147* 137* 141*   Recent Results (from the past 240 hour(s))  Resp panel by RT-PCR (RSV, Flu A&B, Covid) Nasopharyngeal Swab     Status: None   Collection Time: 03/25/2020 12:18 PM   Specimen: Nasopharyngeal Swab; Nasopharyngeal(NP) swabs in vial transport medium  Result Value Ref Range Status   SARS Coronavirus 2 by RT PCR NEGATIVE NEGATIVE Final    Comment:  (NOTE) SARS-CoV-2 target nucleic acids are NOT DETECTED.  The SARS-CoV-2 RNA is generally detectable in upper respiratory specimens during the acute phase of infection. The lowest concentration of SARS-CoV-2 viral copies this assay  can detect is 138 copies/mL. A negative result does not preclude SARS-Cov-2 infection and should not be used as the sole basis for treatment or other patient management decisions. A negative result may occur with  improper specimen collection/handling, submission of specimen other than nasopharyngeal swab, presence of viral mutation(s) within the areas targeted by this assay, and inadequate number of viral copies(<138 copies/mL). A negative result must be combined with clinical observations, patient history, and epidemiological information. The expected result is Negative.  Fact Sheet for Patients:  EntrepreneurPulse.com.au  Fact Sheet for Healthcare Providers:  IncredibleEmployment.be  This test is no t yet approved or cleared by the [place] FDA and  has been authorized for detection and/or diagnosis of SARS-CoV-2 by FDA under an Emergency Use Authorization (EUA). This EUA will remain  in effect (meaning this test can be used) for the duration of the COVID-19 declaration under Section 564(b)(1) of the Act, 21 U.S.C.section 360bbb-3(b)(1), unless the authorization is terminated  or revoked sooner.       Influenza A by PCR NEGATIVE NEGATIVE Final   Influenza B by PCR NEGATIVE NEGATIVE Final    Comment: (NOTE) The Xpert Xpress SARS-CoV-2/FLU/RSV plus assay is intended as an aid in the diagnosis of influenza from Nasopharyngeal swab specimens and should not be used as a sole basis for treatment. Nasal washings and aspirates are unacceptable for Xpert Xpress SARS-CoV-2/FLU/RSV testing.  Fact Sheet for Patients: EntrepreneurPulse.com.au  Fact Sheet for Healthcare  Providers: IncredibleEmployment.be  This test is not yet approved or cleared by the [place] FDA and has been authorized for detection and/or diagnosis of SARS-CoV-2 by FDA under an Emergency Use Authorization (EUA). This EUA will remain in effect (meaning this test can be used) for the duration of the COVID-19 declaration under Section 564(b)(1) of the Act, 21 U.S.C. section 360bbb-3(b)(1), unless the authorization is terminated or revoked.     Resp Syncytial Virus by PCR NEGATIVE NEGATIVE Final    Comment: (NOTE) Fact Sheet for Patients: EntrepreneurPulse.com.au  Fact Sheet for Healthcare Providers: IncredibleEmployment.be  This test is not yet approved or cleared by the [place] FDA and has been authorized for detection and/or diagnosis of SARS-CoV-2 by FDA under an Emergency Use Authorization (EUA). This EUA will remain in effect (meaning this test can be used) for the duration of the COVID-19 declaration under Section 564(b)(1) of the Act, 21 U.S.C. section 360bbb-3(b)(1), unless the authorization is terminated or revoked.  Performed at [facility], [place] [address], [place], [place] [address]   MRSA PCR Screening     Status: None   Collection Time: 03/18/2020  1:28 PM   Specimen: Nasopharyngeal  Result Value Ref Range Status   MRSA by PCR NEGATIVE NEGATIVE Final    Comment:        The GeneXpert MRSA Assay (FDA approved for NASAL specimens only), is one component of a comprehensive MRSA colonization surveillance program. It is not intended to diagnose MRSA infection nor to guide or monitor treatment for MRSA infections. Performed at [facility], [place] [address], [place], [place] [address]   Culture, respiratory (non-expectorated)     Status: None   Collection Time: 03/09/2020  1:52 PM   Specimen: Tracheal Aspirate; Respiratory  Result Value Ref Range Status   Specimen Description TRACHEAL  ASPIRATE  Final   Special Requests NONE  Final   Gram Stain   Final    RARE WBC PRESENT, PREDOMINANTLY PMN NO ORGANISMS SEEN    Culture  Final    RARE Normal respiratory flora-no Staph aureus or Pseudomonas seen Performed at [facility] [address] [place], [place] [address]    Report Status 03/13/2020 FINAL  Final    Studies: DG CHEST PORT 1 VIEW  Result Date: 03/18/2020 CLINICAL DATA:  CVA, cardiomegaly EXAM: PORTABLE CHEST 1 VIEW COMPARISON:  03/15/2020 chest radiograph. FINDINGS: Enteric tube enters stomach with the tip not seen on this image. Right PICC terminates over the lower third of the SVC. Stable cardiomediastinal silhouette with mild cardiomegaly. No pneumothorax. No pleural effusion. No pulmonary edema. Stable minimal bibasilar atelectasis. IMPRESSION: 1. Stable mild cardiomegaly without pulmonary edema. 2. Stable minimal bibasilar atelectasis. Electronically Signed   By: Ilona Sorrel M.D.   On: 03/18/2020 09:51     Scheduled Meds: . aspirin  325 mg Per Tube Daily  . atorvastatin  20 mg Per Tube Daily  . carvedilol  25 mg Per Tube BID  . chlorhexidine gluconate (MEDLINE KIT)  15 mL Mouth Rinse BID  . Chlorhexidine Gluconate Cloth  6 each Topical Daily  . cholecalciferol  5,000 Units Per Tube Daily  . feeding supplement (PROSource TF)  45 mL Per Tube TID  . free water  200 mL Per Tube Q6H  . heparin injection (subcutaneous)  5,000 Units Subcutaneous Q8H  . latanoprost  1 drop Both Eyes QHS  . levothyroxine  50 mcg Per Tube QAC breakfast  . mouth rinse  15 mL Mouth Rinse 10 times per day  . pantoprazole sodium  40 mg Per Tube Daily  . sodium chloride HYPERTONIC  4 mL Nebulization Q6H WA   Continuous Infusions: . sodium chloride    . clevidipine    . feeding supplement (OSMOLITE 1.5 CAL) 1,000 mL (03/18/20 9485)   PRN Meds: [DISCONTINUED] acetaminophen **OR** acetaminophen (TYLENOL) oral liquid 160 mg/5 mL **OR** acetaminophen, labetalol  Time spent:  35 minutes  Author: Berle Mull, MD [facility] 03/18/2020 6:29 PM  To reach On-call, see care teams to locate the attending and reach out to them via [email or web address]. If 7PM-7AM, please contact night-coverage If you still have difficulty reaching the attending provider, please page the [facility] (Director on Call) for [facility] on amion for assistance.

## 2020-03-19 LAB — GLUCOSE, CAPILLARY
Glucose-Capillary: 165 mg/dL — ABNORMAL HIGH (ref 70–99)
Glucose-Capillary: 134 mg/dL — ABNORMAL HIGH (ref 70–99)
Glucose-Capillary: 150 mg/dL — ABNORMAL HIGH (ref 70–99)
Glucose-Capillary: 150 mg/dL — ABNORMAL HIGH (ref 70–99)
Glucose-Capillary: 147 mg/dL — ABNORMAL HIGH (ref 70–99)

## 2020-03-19 LAB — BASIC METABOLIC PANEL
Anion gap: 7 (ref 5–15)
BUN: 27 mg/dL — ABNORMAL HIGH (ref 8–23)
CO2: 24 mmol/L (ref 22–32)
Calcium: 8.4 mg/dL — ABNORMAL LOW (ref 8.9–10.3)
Chloride: 101 mmol/L (ref 98–111)
Creatinine, Ser: 0.82 mg/dL (ref 0.61–1.24)
GFR, Estimated: >60 mL/min (ref >60)
Glucose, Bld: 164 mg/dL — ABNORMAL HIGH (ref 70–99)
Potassium: 4.4 mmol/L (ref 3.5–5.1)
Sodium: 132 mmol/L — ABNORMAL LOW (ref 135–145)

## 2020-03-19 LAB — CBC
HCT: 35.2 % — ABNORMAL LOW (ref 39.0–52.0)
Hemoglobin: 12 g/dL — ABNORMAL LOW (ref 13.0–17.0)
MCH: 30.9 pg (ref 26.0–34.0)
MCHC: 34.1 g/dL (ref 30.0–36.0)
MCV: 90.7 fL (ref 80.0–100.0)
Platelets: 174 K/uL (ref 150–400)
RBC: 3.88 MIL/uL — ABNORMAL LOW (ref 4.22–5.81)
RDW: 13.8 % (ref 11.5–15.5)
WBC: 9 K/uL (ref 4.0–10.5)
nRBC: 0 % (ref 0.0–0.2)

## 2020-03-19 MED ORDER — FREE WATER
100.0000 mL | Freq: Four times a day (QID) | Status: DC
  Administered 2020-03-19 – 2020-03-23 (×18): 100 mL

## 2020-03-19 MED ORDER — GLYCOPYRROLATE 0.2 MG/ML IJ SOLN
0.1000 mg | INTRAMUSCULAR | Status: DC | PRN
  Administered 2020-03-19 – 2020-03-22 (×2): 0.1 mg via INTRAVENOUS
  Filled 2020-03-19 (×2): qty 1

## 2020-03-19 MED ORDER — AMLODIPINE BESYLATE 5 MG PO TABS: 5.0000 mg | ORAL_TABLET | Freq: Every day | ORAL | Status: DC

## 2020-03-19 MED ORDER — AMLODIPINE BESYLATE 2.5 MG PO TABS
2.5000 mg | ORAL_TABLET | Freq: Two times a day (BID) | ORAL | Status: DC
  Administered 2020-03-19 (×2): 2.5 mg
  Filled 2020-03-19 (×2): qty 1

## 2020-03-19 NOTE — Progress Notes (Addendum)
[facility] Consultation Progress Note  Patient: Jeffrey Ashley GDJ:[id number]   PCP: Venia Carbon, MD DOB: 16-Feb-1927   DOA: 03/22/2020   DOS: 03/19/2020   Date of Service: the patient was seen and examined on 03/19/2020 Primary service: Melvenia Beam, MD    Brief hospital course: Past medical history of chronic A. fib, dementia, HTN, hypothyroidism, BPH, SDH.  Presents with left-sided weakness found to have MCA stroke SP TPA and thrombectomy on 03/29/2020. Currently further plan is monitor for improvement in mentation.  Subjective: No acute events overnight. Per niece who is at bedside reportedly patient was more agitated yesterday. Appears to have some increased secretion. Arousable to verbal cues.  Assessment and Plan: 1.  Large MCA stroke Small cerebral stroke. SP TPA and thrombectomy on 12/11 On aspirin and Lipitor. Neurology is the primary team right now.  Management per neurology. Appears to be suffering from global slowing post stroke. Concern is whether there will be meaningful improvement in patient's condition. Son would like to discuss with neurology.  2.  Dysphagia After the stroke. Currently has core track. If the swallowing does not improve patient will benefit from palliative care consultation. Most likely patient will require long-term feeding tube although given persistent risk for aspiration PEG tube will not be a good idea as it will not prevent patient from aspirating.  3.  Chronic A. Fib Patient was on anticoagulation with warfarin. Patient developed spontaneous subdural hemorrhage therefore anticoagulation was discontinued. Now back on aspirin. Outpatient follow-up with cardiology for further discussion regarding reinitiation of aspirin although given the age and prior history conservative measures are appropriate.  4. Goals of care conversation. Niece at bedside, understands patient's poor progression and mentions that if the patient  needs a feeding tube his son would like to have a feeding tube. Son is the POA. Discussed with patient's son on the phone. Explained that with core track patient will not be able to be discharged from the hospital. At present do not think the patient is a candidate for 3 hours of intensive CIR therapy although will monitor for improvement. Son would like to monitor for at least few more days before making any final decision. Explained that the while patient may require PEG tube placement if not able to swallow safely although PEG tube will not stop patient from having aspiration pneumonia and might cause more pneumonia. Requested to have palliative care consultation although son would like to hold for now.  5. Poor secretion clearance. Chest vest with chest PT. Not sure whether patient will be able to tolerate it well or not.  Acute hypoxic respiratory failure: Extubated on 03/14/2020  Aspiration pneumonia: Completed IV Unasyn.  Hypertension: Continue antihypertensives  Hyperlipidemia: Continue Lipitor  Recommendation on discharge: To be determined   Diet: NPO  DVT Prophylaxis: Subcutaneous Lovenox  Family Communication: no family was present at bedside, at the time of interview.   Disposition: We will continue to follow the patient.   Other Consultants: PCCM  Procedures: Echocardiogram   S/p TPA and thrombectomy on 03/16/2020  Intubated on 03/07/2020 and extubated on 03/14/2020  Antibiotics: Anti-infectives (From admission, onward)   Start     Dose/Rate Route Frequency Ordered Stop   03/12/2020 1445  Ampicillin-Sulbactam (UNASYN) 3 g in sodium chloride 0.9 % 100 mL IVPB  Status:  Discontinued        3 g 200 mL/hr over 30 Minutes Intravenous Every 8 hours 03/02/2020 1358 03/14/20 0823   03/18/2020 1026  ceFAZolin (ANCEF)  2-4 GM/100ML-% IVPB       Note to Pharmacy: Novella Rob   : cabinet override      03/03/2020 1026 03/19/2020 2229      Objective: Physical  Exam: Vitals:   03/19/20 0815 03/19/20 1000 03/19/20 1206 03/19/20 1925  BP: (!) 154/114 (!) 150/110 121/84   Pulse: 89 91 83   Resp: 20  20   Temp: 97.6 F (36.4 C) 97.9 F (36.6 C) 97.8 F (36.6 C)   TempSrc: Oral Oral Oral   SpO2: 96% 96% 98% 98%  Weight:      Height:        Intake/Output Summary (Last 24 hours) at 03/19/2020 1933 Last data filed at 03/19/2020 1127 Gross per 24 hour  Intake --  Output 1700 ml  Net -1700 ml   Filed Weights   03/15/20 0500 03/17/20 0500 03/18/20 0500  Weight: 80 kg 87.8 kg 89.7 kg   General: Appear in moderate distress, no Rash; Oral Mucosa Clear, moist. no Abnormal Neck Mass Or lumps, Conjunctiva normal  Cardiovascular: S1 and S2 Present, no Murmur, Respiratory: good respiratory effort, Bilateral Air entry present and  bilateral crackles, no wheezes Abdomen: Bowel Sound present, Soft and no tenderness Extremities: no Pedal edema, no calf tenderness Neurology: lethargic and not oriented to time, place, and person affect unresponsive.  Gait not checked due to patient safety concerns   Data Reviewed: CBC: Recent Labs  Lab 03/15/20 0118 03/16/20 0030 03/17/20 0402 03/18/20 0247 03/19/20 0108  WBC 8.1 8.3 9.7 8.6 9.0  HGB 11.1* 11.6* 11.3* 11.8* 12.0*  HCT 33.6* 35.7* 35.5* 37.3* 35.2*  MCV 94.4 94.9 93.9 94.9 90.7  PLT 133* 131* 154 161 644   Basic Metabolic Panel: Recent Labs  Lab 03/13/20 1216 03/13/20 1629 03/14/20 0609 03/14/20 0954 03/14/20 1722 03/15/20 0118 03/15/20 1013 03/16/20 0030 03/17/20 0402 03/18/20 0247 03/19/20 0108  NA  --   --  145   < >  --  146*  --  142 141 138 132*  K  --   --  3.5   < >  --  4.1  --  4.1 4.1 4.2 4.4  CL  --   --  114*  --   --  115*  --  115* 110 106 101  CO2  --   --  20*  --   --  22  --  21* _0 GLUCOSE  --   --  130*  --   --  97  --  142* 149* 149* 164*  BUN  --   --  25*  --   --  24*  --  32* 30* 31* 27*  CREATININE  --   --  1.01  --   --  1.01  --  0.88 0.90  0.85 0.82  CALCIUM  --   --  8.5*  --   --  8.6*  --  8.6* 8.6* 8.5* 8.4*  MG 2.4 2.2 2.2  --  2.0  --  2.0  --   --   --   --   PHOS 3.1 3.2 3.0  --  3.5  --  3.6  --   --   --   --    < > = values in this interval not displayed.   Liver Function Tests: No results for input(s): AST, ALT, ALKPHOS, BILITOT, PROT, ALBUMIN in the last 168 hours. No results for input(s): LIPASE, AMYLASE in the last  168 hours. No results for input(s): AMMONIA in the last 168 hours. Cardiac Enzymes: No results for input(s): CKTOTAL, CKMB, CKMBINDEX, TROPONINI in the last 168 hours. BNP (last 3 results) No results for input(s): BNP in the last 8760 hours. CBG: Recent Labs  Lab 03/18/20 2353 03/19/20 0354 03/19/20 0813 03/19/20 1203 03/19/20 1717  GLUCAP 166* 165* 134* 150* 150*   Recent Results (from the past 240 hour(s))  Resp panel by RT-PCR (RSV, Flu A&B, Covid) Nasopharyngeal Swab     Status: None   Collection Time: 03/21/2020 12:18 PM   Specimen: Nasopharyngeal Swab; Nasopharyngeal(NP) swabs in vial transport medium  Result Value Ref Range Status   SARS Coronavirus 2 by RT PCR NEGATIVE NEGATIVE Final    Comment: (NOTE) SARS-CoV-2 target nucleic acids are NOT DETECTED.  The SARS-CoV-2 RNA is generally detectable in upper respiratory specimens during the acute phase of infection. The lowest concentration of SARS-CoV-2 viral copies this assay can detect is 138 copies/mL. A negative result does not preclude SARS-Cov-2 infection and should not be used as the sole basis for treatment or other patient management decisions. A negative result may occur with  improper specimen collection/handling, submission of specimen other than nasopharyngeal swab, presence of viral mutation(s) within the areas targeted by this assay, and inadequate number of viral copies(<138 copies/mL). A negative result must be combined with clinical observations, patient history, and epidemiological information. The expected  result is Negative.  Fact Sheet for Patients:  EntrepreneurPulse.com.au  Fact Sheet for Healthcare Providers:  IncredibleEmployment.be  This test is no t yet approved or cleared by the [place] FDA and  has been authorized for detection and/or diagnosis of SARS-CoV-2 by FDA under an Emergency Use Authorization (EUA). This EUA will remain  in effect (meaning this test can be used) for the duration of the COVID-19 declaration under Section 564(b)(1) of the Act, 21 U.S.C.section 360bbb-3(b)(1), unless the authorization is terminated  or revoked sooner.       Influenza A by PCR NEGATIVE NEGATIVE Final   Influenza B by PCR NEGATIVE NEGATIVE Final    Comment: (NOTE) The Xpert Xpress SARS-CoV-2/FLU/RSV plus assay is intended as an aid in the diagnosis of influenza from Nasopharyngeal swab specimens and should not be used as a sole basis for treatment. Nasal washings and aspirates are unacceptable for Xpert Xpress SARS-CoV-2/FLU/RSV testing.  Fact Sheet for Patients: EntrepreneurPulse.com.au  Fact Sheet for Healthcare Providers: IncredibleEmployment.be  This test is not yet approved or cleared by the [place] FDA and has been authorized for detection and/or diagnosis of SARS-CoV-2 by FDA under an Emergency Use Authorization (EUA). This EUA will remain in effect (meaning this test can be used) for the duration of the COVID-19 declaration under Section 564(b)(1) of the Act, 21 U.S.C. section 360bbb-3(b)(1), unless the authorization is terminated or revoked.     Resp Syncytial Virus by PCR NEGATIVE NEGATIVE Final    Comment: (NOTE) Fact Sheet for Patients: EntrepreneurPulse.com.au  Fact Sheet for Healthcare Providers: IncredibleEmployment.be  This test is not yet approved or cleared by the [place] FDA and has been authorized for detection and/or diagnosis of  SARS-CoV-2 by FDA under an Emergency Use Authorization (EUA). This EUA will remain in effect (meaning this test can be used) for the duration of the COVID-19 declaration under Section 564(b)(1) of the Act, 21 U.S.C. section 360bbb-3(b)(1), unless the authorization is terminated or revoked.  Performed at [facility], [place] [address] [place], [place] [address]   MRSA PCR Screening  Status: None   Collection Time: 03/06/2020  1:28 PM   Specimen: Nasopharyngeal  Result Value Ref Range Status   MRSA by PCR NEGATIVE NEGATIVE Final    Comment:        The GeneXpert MRSA Assay (FDA approved for NASAL specimens only), is one component of a comprehensive MRSA colonization surveillance program. It is not intended to diagnose MRSA infection nor to guide or monitor treatment for MRSA infections. Performed at [facility], [place] [address] [place], [place] [address]   Culture, respiratory (non-expectorated)     Status: None   Collection Time: 03/21/2020  1:52 PM   Specimen: Tracheal Aspirate; Respiratory  Result Value Ref Range Status   Specimen Description TRACHEAL ASPIRATE  Final   Special Requests NONE  Final   Gram Stain   Final    RARE WBC PRESENT, PREDOMINANTLY PMN NO ORGANISMS SEEN    Culture   Final    RARE Normal respiratory flora-no Staph aureus or Pseudomonas seen Performed at [facility] [address], [place], [place] [address]    Report Status 03/13/2020 FINAL  Final    Studies: No results found.   Scheduled Meds: . amLODipine  2.5 mg Per Tube BID  . aspirin  325 mg Per Tube Daily  . atorvastatin  20 mg Per Tube Daily  . carvedilol  25 mg Per Tube BID  . chlorhexidine gluconate (MEDLINE KIT)  15 mL Mouth Rinse BID  . Chlorhexidine Gluconate Cloth  6 each Topical Daily  . cholecalciferol  5,000 Units Per Tube Daily  . feeding supplement (PROSource TF)  45 mL Per Tube TID  . free water  100 mL Per Tube Q6H  . heparin injection (subcutaneous)   5,000 Units Subcutaneous Q8H  . latanoprost  1 drop Both Eyes QHS  . levothyroxine  50 mcg Per Tube QAC breakfast  . mouth rinse  15 mL Mouth Rinse 6 times per day  . pantoprazole sodium  40 mg Per Tube Daily  . sodium chloride HYPERTONIC  4 mL Nebulization Q6H WA   Continuous Infusions: . sodium chloride    . feeding supplement (OSMOLITE 1.5 CAL) 1,000 mL (03/19/20 1833)   PRN Meds: [DISCONTINUED] acetaminophen **OR** acetaminophen (TYLENOL) oral liquid 160 mg/5 mL **OR** acetaminophen, glycopyrrolate  Time spent: 35 minutes  Author: Berle Mull, MD [facility] 03/19/2020 7:33 PM  To reach On-call, see care teams to locate the attending and reach out to them via [email or web address]. If 7PM-7AM, please contact night-coverage If you still have difficulty reaching the attending provider, please page the [facility] (Director on Call) for [facility] on amion for assistance.

## 2020-03-19 NOTE — Progress Notes (Signed)
STROKE TEAM PROGRESS NOTE   INTERVAL HISTORY Niece is at bedside. Dr. Allena Katz to reach out to patient's son regarding status including peg tube. Patient is neuro stable. Patient is lethargic, sleeping, aroses to light stim and shakes my hand with his right hands. Follows comannds on the right.   OBJECTIVE Vitals:   03/19/20 0719 03/19/20 0815 03/19/20 1000 03/19/20 1206  BP:  (!) 154/114 (!) 150/110 121/84  Pulse:  89 91 83  Resp:  20  20  Temp:  97.6 F (36.4 C) 97.9 F (36.6 C) 97.8 F (36.6 C)  TempSrc:  Oral Oral Oral  SpO2: 95% 96% 96% 98%  Weight:      Height:       CBC:  Recent Labs  Lab 03/18/20 0247 03/19/20 0108  WBC 8.6 9.0  HGB 11.8* 12.0*  HCT 37.3* 35.2*  MCV 94.9 90.7  PLT 161 174   Basic Metabolic Panel:  Recent Labs  Lab 03/14/20 1722 03/15/20 0118 03/15/20 1013 03/16/20 0030 03/18/20 0247 03/19/20 0108  NA  --    < >  --    < > 138 132*  K  --    < >  --    < > 4.2 4.4  CL  --    < >  --    < > 106 101  CO2  --    < >  --    < > 23 24  GLUCOSE  --    < >  --    < > 149* 164*  BUN  --    < >  --    < > 31* 27*  CREATININE  --    < >  --    < > 0.85 0.82  CALCIUM  --    < >  --    < > 8.5* 8.4*  MG 2.0  --  2.0  --   --   --   PHOS 3.5  --  3.6  --   --   --    < > = values in this interval not displayed.   Lipid Panel:     Component Value Date/Time   CHOL 123 03/12/2020 0054   TRIG 102 03/14/2020 0609   HDL 34 (L) 03/12/2020 0054   CHOLHDL 3.6 03/12/2020 0054   VLDL 18 03/12/2020 0054   LDLCALC 71 03/12/2020 0054   HgbA1c:  Lab Results  Component Value Date   HGBA1C 5.8 (H) 03/12/2020   Urine Drug Screen:     Component Value Date/Time   LABOPIA NEGATIVE 03/13/2012 1900   COCAINSCRNUR NEGATIVE 03/13/2012 1900   LABBENZ NEGATIVE 03/13/2012 1900   AMPHETMU NEGATIVE 03/13/2012 1900   THCU NEGATIVE 03/13/2012 1900   LABBARB NEGATIVE 03/13/2012 1900    Alcohol Level No results found for: ETH  IMAGING  CT HEAD CODE STROKE WO  CONTRAST 26-Mar-2020 1. No acute intracranial process. Mild cerebral atrophy and chronic microvascular ischemic changes.  2. ASPECTS is 10  3. Remote lacunar insult versus dilated perivascular spaces involving the right basal ganglia, posterior limb of the right internal capsule and left insula.   CT Code Stroke CTA Head W/WO contrast CT Code Stroke CTA Neck W/WO contrast 03/17/2020   Right M1 segment large vessel occlusion. No large vessel occlusion within the neck. 50% right and 30% left proximal ICA luminal narrowing. Mild-to-moderate left vertebral artery origin narrowing. Mild narrowing of the bilateral V4 segments, right P3 and left P2 segments.  MR BRAIN WO CONTRAST MR ANGIO HEAD WO CONTRAST 03/12/2020 Sequela of acute/subacute right MCA territory infarct. Small acute right cerebellar insult. Interval revascularization of the right MCA. No high-grade intracranial narrowing or large vessel occlusion. Mild narrowing of the right V4, right P2 and left M2 segments.   DG Chest Port 1 View 03/15/2020 1.  Interval endotracheal extubation. 2. There is improved aeration of the lungs without acute airspace opacity. 3.  Cardiomegaly. 03/12/2020 Increased bibasilar opacities. Slight interval retraction of ETT.  02-18-2020 1. Endotracheal tube distal tip in the midthoracic trachea.  2. Bibasilar subsegmental atelectasis.  DG Chest Port 1 View 03/18/2020 Impression: 1. Stable mild cardiomegaly without pulmonary edema.  2. Stable minimal bibasilar atelectasis.  DG Abd Portable 1V 03/13/2020 Orogastric tube tip and side port in distal stomach. No bowel obstruction or free air evident. Apparent staghorn calculi in left kidney, stable. Small pleural effusions bilaterally with bibasilar atelectasis.  02-18-2020 Enteric tube with tip in the distal esophagus, side-port in the mid esophagus. Recommend advancement of greater than 10 cm for optimal placement.   ECHOCARDIOGRAM  COMPLETE 02-18-2020 1. Left ventricular ejection fraction, by estimation, is 50 to 55%. The left ventricle has low normal function. The left ventricle has no regional wall motion abnormalities. There is severe concentric left ventricular hypertrophy. Left ventricular diastolic parameters are indeterminate.   2. Right ventricular systolic function is normal. The right ventricular size is normal.   3. Left atrial size was severely dilated.   4. Right atrial size was severely dilated.   5. A small pericardial effusion is present. The pericardial effusion is surrounding the apex.   6. The mitral valve is grossly normal. Trivial mitral valve regurgitation.   7. The aortic valve is tricuspid. There is mild calcification of the aortic valve. Aortic valve regurgitation is mild. No aortic stenosis is present.   8. The inferior vena cava is normal in size with greater than 50% respiratory variability, suggesting right atrial pressure of 3 mmHg. Comparison(s): No prior Echocardiogram.   ECG - atrial fibrillation - ventricular response 81 BPM (See cardiology reading for complete details)   PHYSICAL EXAM   Temp:  [97.6 F (36.4 C)-98.7 F (37.1 C)] 97.8 F (36.6 C) (12/19 1206) Pulse Rate:  [83-95] 83 (12/19 1206) Resp:  [18-20] 20 (12/19 1206) BP: (121-176)/(84-114) 121/84 (12/19 1206) SpO2:  [95 %-100 %] 98 % (12/19 1206)  General - Well nourished, well developed, lethargic.  Ophthalmologic - fundi not visualized due to noncooperation.  Cardiovascular - irregularly irregular heart rate and rhythm.  Neuro - lethargic, eyes open with voice or liht stim, able to follow all simple midline and right hand and foot commands. However, nonverbal with anarthria. With eye opening, eyes in right gaze preference position, however able to cross midline, although left gaze incomplete. Not blinking to visual threat on the left, blinking on the right, pupils equal bilaterally reactive to light. Left facial droop,  tongue midline in mouth but protrusion not cooperative. RUE spontaneous purposeful movement able to against gravity, able to follow commands on the right hand. RLE proximal 2/5 and knee flexion 3+/5 on pain, LUE flaccid, LLE mild withdraw movement of the knee. DTR 1+ and no babinski. Sensation, coordination not cooperative and gait not tested.   ASSESSMENT/PLAN [name] is a 86 y.o. male with history of hypertension, hypothyroidism, hearing impaired, subdural hematoma in 2013 and atrial fibrillation not on anticoagulation who presents with dysarthria and left-sided weakness. The pt vomited en route and  there was concern for possible aspiration.  The patient received IV t-PA Saturday 04/03/20 at 9:45 AM. IR 04-03-20 at 11:40 AM - S/P complete revascularization of RT MCA M1 occlusion achieving a TICI 3 revascularization.  Stroke: Rt MCA and small R cerebellar infarcts - embolic - likely from atrial fibrillation without anticoagulation  CT Head -  No acute intracranial process. Remote lacunar insult versus dilated perivascular spaces involving the right basal ganglia, posterior limb of the right internal capsule and left insula.   CTA H&N - Right M1 segment large vessel occlusion. No large vessel occlusion within the neck. 50% right and 30% left proximal ICA luminal narrowing. Mild-to-moderate left vertebral artery origin narrowing. Mild narrowing of the bilateral V4 segments, right P3 and left P2 segments.  MRI / MRA head - R large extensive MCA infarct. Small R cerebellar infarct. Interval revascularization R MCA. Mild narrowing R V4, R P2, L M2  2D Echo - EF 50 - 55%. No cardiac source of emboli identified.   [employer] Virus 2 - negative  LDL - 71  HgbA1c - 5.8  VTE prophylaxis - Heparin 5000 units sq tid   aspirin 81 mg daily prior to admission, now on aspirin 325 mg daily   Therapy recommendations: SNF  Disposition: - if respiratory / swallow condition does not improve  over the weekend, may consider palliative care consult - still NPO. Speech continues to work with pt. Dr. Allena Katz to reach out to patient's son regarding status including possibly palliative care, discussed with Dr. Allena Katz today, appreciate our colleague following along with [place].   Chronic Afib  Chronic afib but not sure about afib burden   Was on coumadin in the past, off after SDH  Never restarted due to advanced age, spontaneous SDH, gross hematuria  Follow with Dr. Marge Duncans at [place] [facility]  Currently rate controlled  Now on ASA. Dr. Allena Katz to reach out to patient's son regarding status including AC in the future, PEG tube if swallowing doesn't improve or palliative care. discussed with Dr. Allena Katz today, appreciate our colleague following along with [place].     Acute Hypoxic Respiratory failure  Intubated on vent ->now extubated  Off sedation  CCM on board  Tolerating post extubation, however still has difficulty with oral secretion  Chest PT  3% saline nebulization  NT suctioning as needed  CXR 12/18 - Stable mild cardiomegaly without pulmonary edema. Stable minimal bibasilar atelectasis  Aspiration pneumonia  Vomited x2 before intubation  T Max - 100.4->afebrile->100.4->99.7  IV Unasyn started 04/03/2020-> off now  [facility] - 6.0->9.1->10.3->8.1->8.3->9.7->8.6  Respiratory cultures - normal respiratory flora.  CXR 12/18 - Stable mild cardiomegaly without pulmonary edema. Stable minimal bibasilar atelectasis.  Hypertension  Home BP meds: Coreg  Current BP meds: Resumed Coreg   Stable . BP goal post tPA < 180/105, gradually normalize in 5-7 days  . Long-term BP goal normotensive  Hyperlipidemia  Home Lipid lowering medication: none   LDL 71, goal < 70  Put on lipitor 20  Continue statin on discharge  Dysphagia  N.p.o.  s/p cortrak 12/15  On TF and FW - Osmolite / ProSource  Speech to follow  Pt remains NPO per speech therapy.  Other Stroke  Risk Factors  Advanced age  Previous ETOH use  Hx of R SDH 2013 s/p craniotomy  Possible remote lacunar by imaging  Other Active Problems, Findings and Recommendations  Code status - Limited code CKD - stage 3a - creatinine - 1.26->1.21->1.25->1.01->0.88->0.90->0.85->0.82 - normalized and  stable. On Vit D Hypothyroid on synthroid On Xalatan    Hematuria - catheter related trauma - monitoring  Hospital day # 8  Dr. Allena Katz to reach out to patient's son regarding status including AC in the future, PEG tube if swallowing doesn't improve or palliative care. discussed with Dr. Allena Katz today, appreciate our colleague following along with [place].   Personally examined patient and images, and have participated in and made any corrections needed to history, physical, neuro exam,assessment and plan as stated above.  I have personally obtained the history, evaluated lab date, reviewed imaging studies and agree with radiology interpretations.    Naomie Dean, MD Stroke Neurology  I spent 25 minutes of face-to-face and non-face-to-face time with patient. This included prechart review, lab review, study review, order entry, electronic health record documentation, patient education on the different diagnostic and therapeutic options, counseling and coordination of care, risks and benefits of management, compliance, or risk factor reduction   To contact Stroke Continuity provider, please refer to [email or web address]. After hours, contact General Neurology

## 2020-03-19 NOTE — Progress Notes (Signed)
°  Speech Language Pathology Treatment: Dysphagia  Patient Details Name: Jeffrey Ashley MRN: [id number] DOB: February 20, 1927 Today's Date: 03/19/2020 Time: 0920-0940 SLP Time Calculation (min) (ACUTE ONLY): 20 min  Assessment / Plan / Recommendation Clinical Impression  Patient seen to address dysphagia goals for PO readiness. Upon entering room, patient sleeping, eyes closed and mouth open. Patient mouth-breathing with audible pharyngeal secretions. SLP performed oral care and during this time, patient would intermittently open eyes and did follow SLP's command two times to cough. He produced what sounds like a fairly strong cough and SLP did observe sound of pharyngeal secretions starting to transit, however patient was not able to transit secretions past oropharynx. Patient reached out his right hand two times and squeezed SLP's hand but unable to determine the intention of this as patient is not alert enough. Patient continues to not be able to manage his secretions in addition to lethargy and difficulty with arousal. NPO continues to be recommendation but SLP will continue to follow patient for readiness of PO's.    HPI HPI: Jeffrey Ashley is 86 yo M w/ dementia presenting w/ acute left sided weakness on code stroke. Received IV TPA and right MCA M1 re-vascularized by neuro ir. Remained intubated due to suspected aspiration. MRI on 12/12 post-intervention showing large right MCA infarct. CXR 12/15: "There is improved aeration of the lungs without acute airspace opacity."      SLP Plan  Continue with current plan of care       Recommendations  Diet recommendations: NPO Medication Administration: Via alternative means                Oral Care Recommendations: Oral care QID Follow up Recommendations: Skilled Nursing facility;24 hour supervision/assistance SLP Visit Diagnosis: Dysphagia, unspecified (R13.10) Plan: Continue with current plan of care       GO               Angela Nevin, MA, CCC-SLP Speech Therapy [facility]

## 2020-03-19 NOTE — Progress Notes (Signed)
   03/19/20 0815  Assess: MEWS Score  Temp 97.6 F (36.4 C)  BP (!) 154/114  Pulse Rate 89  Resp 20  Level of Consciousness Unresponsive  SpO2 96 %  O2 Device Room Air  Assess: MEWS Score  MEWS Temp 0  MEWS Systolic 0  MEWS Pulse 0  MEWS RR 0  MEWS LOC 3  MEWS Score 3  MEWS Score Color Yellow  Assess: if the MEWS score is Yellow or Red  Were vital signs taken at a resting state? Yes  Focused Assessment No change from prior assessment  Early Detection of Sepsis Score *See Row Information* Low  MEWS guidelines implemented *See Row Information* No, altered LOC is baseline  Treat  Pain Scale PAINAD  Breathing 0  Take Vital Signs  Increase Vital Sign Frequency  Yellow: Q 2hr X 2 then Q 4hr X 2, if remains yellow, continue Q 4hrs  Notify: Charge Nurse/RN  Name of Charge Nurse/RN Notified Angela, RN  Date Charge Nurse/RN Notified 03/19/20  Time Charge Nurse/RN Notified 0830  Notify: Provider  Provider Name/Title Naomie Dean, MD  Date Provider Notified 03/19/20  Time Provider Notified 1000  Notification Type Page  Response No new orders  Document  Patient Outcome Other (Comment) (No change from previous)  Progress note created (see row info) Yes

## 2020-03-20 DIAGNOSIS — R1310 Dysphagia, unspecified: Secondary | ICD-10-CM

## 2020-03-20 LAB — BASIC METABOLIC PANEL
Anion gap: 9 (ref 5–15)
BUN: 32 mg/dL — ABNORMAL HIGH (ref 8–23)
CO2: 26 mmol/L (ref 22–32)
Calcium: 8.6 mg/dL — ABNORMAL LOW (ref 8.9–10.3)
Chloride: 99 mmol/L (ref 98–111)
Creatinine, Ser: 0.97 mg/dL (ref 0.61–1.24)
GFR, Estimated: >60 mL/min (ref >60)
Glucose, Bld: 168 mg/dL — ABNORMAL HIGH (ref 70–99)
Potassium: 4.8 mmol/L (ref 3.5–5.1)
Sodium: 134 mmol/L — ABNORMAL LOW (ref 135–145)

## 2020-03-20 LAB — GLUCOSE, CAPILLARY
Glucose-Capillary: 124 mg/dL — ABNORMAL HIGH (ref 70–99)
Glucose-Capillary: 127 mg/dL — ABNORMAL HIGH (ref 70–99)
Glucose-Capillary: 139 mg/dL — ABNORMAL HIGH (ref 70–99)
Glucose-Capillary: 147 mg/dL — ABNORMAL HIGH (ref 70–99)
Glucose-Capillary: 153 mg/dL — ABNORMAL HIGH (ref 70–99)
Glucose-Capillary: 153 mg/dL — ABNORMAL HIGH (ref 70–99)

## 2020-03-20 LAB — CBC
HCT: 36.3 % — ABNORMAL LOW (ref 39.0–52.0)
Hemoglobin: 11.7 g/dL — ABNORMAL LOW (ref 13.0–17.0)
MCH: 29.7 pg (ref 26.0–34.0)
MCHC: 32.2 g/dL (ref 30.0–36.0)
MCV: 92.1 fL (ref 80.0–100.0)
Platelets: 195 10*3/uL (ref 150–400)
RBC: 3.94 MIL/uL — ABNORMAL LOW (ref 4.22–5.81)
RDW: 13.9 % (ref 11.5–15.5)
WBC: 11.3 10*3/uL — ABNORMAL HIGH (ref 4.0–10.5)
nRBC: 0 % (ref 0.0–0.2)

## 2020-03-20 MED ORDER — AMLODIPINE BESYLATE 10 MG PO TABS
10.0000 mg | ORAL_TABLET | Freq: Every day | ORAL | Status: DC
  Administered 2020-03-21 – 2020-03-23 (×3): 10 mg
  Filled 2020-03-20 (×3): qty 1

## 2020-03-20 NOTE — Assessment & Plan Note (Addendum)
-   BP improved now -Scheduled Lopressor now ordered

## 2020-03-20 NOTE — Assessment & Plan Note (Addendum)
-   remains severe; still failed SLP eval again on 12/20 - keep NPO - still do not recommend PEG as this will prolong poor quality of life and increase risk of developing further complications and does not decrease aspiration risk -Tube feeds and core trak discontinued

## 2020-03-20 NOTE — Progress Notes (Signed)
Physical Therapy Treatment Patient Details Name: Jeffrey Ashley MRN: [id number] DOB: [birth year] Today's Date: 03/20/2020    History of Present Illness Patient is a 86 y/o male who presents with left sided weakness. 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 NIH:18. s/p tpA and complete revascularization right MCA 12/11. Remained intubated due to concerns for aspiration. Extubated 12/14. PMH includes HTN and Dysrhythmia.    PT Comments    Patient able to follow simple commands with rt extremities, eyes (open/close), and mouth (open, close, stick out tongue--incr time). Actively moving left ankle, however not always on command. No active knee extension elicited. Incr time working on trunk mobility and cervical ROM as pt with left gaze preference.     Follow Up Recommendations  SNF;Supervision for mobility/OOB     Equipment Recommendations  None recommended by PT    Recommendations for Other Services       Precautions / Restrictions Precautions Precautions: Fall;Other (comment) Precaution Comments: left hemi, left neglect, cortrak    Mobility  Bed Mobility Overal bed mobility: Needs Assistance Bed Mobility: Rolling Rolling: Max assist;Total assist         General bed mobility comments: Patient requires in time and assist to roll to his left; he resists rolling to his right (keeps scooting his hips opposite direction and rolling to his left; does not show signs of discomfort)  Transfers                 General transfer comment: deferred at this time  Ambulation/Gait                 Stairs             Wheelchair Mobility    Modified Rankin (Stroke Patients Only) Modified Rankin (Stroke Patients Only) Pre-Morbid Rankin Score: Moderately severe disability Modified Rankin: Severe disability     Balance                                            Cognition Arousal/Alertness: Lethargic (initially, improved during session) Behavior  During Therapy: Flat affect Overall Cognitive Status: Difficult to assess Area of Impairment: Following commands                   Current Attention Level: Sustained   Following Commands: Follows one step commands with increased time     Problem Solving: Decreased initiation;Requires verbal cues;Requires tactile cues        Exercises Other Exercises Other Exercises: PROM LUE/LE, P/AAROM RUE/LE Other Exercises: up to chair position working on head/neck controll and ROM    General Comments General comments (skin integrity, edema, etc.): Patient awakened with PROM of UEs, calling his name.      Pertinent Vitals/Pain Pain Assessment: Faces Faces Pain Scale: No hurt    Home Living                      Prior Function            PT Goals (current goals can now be found in the care plan section) Acute Rehab PT Goals Patient Stated Goal: not stated Time For Goal Achievement: 03/28/20 Potential to Achieve Goals: Fair Progress towards PT goals: Not progressing toward goals - comment    Frequency    Min 3X/week      PT Plan Current plan remains appropriate    Co-evaluation  AM-PAC PT "6 Clicks" Mobility   Outcome Measure  Help needed turning from your back to your side while in a flat bed without using bedrails?: Total Help needed moving from lying on your back to sitting on the side of a flat bed without using bedrails?: Total Help needed moving to and from a bed to a chair (including a wheelchair)?: Total Help needed standing up from a chair using your arms (e.g., wheelchair or bedside chair)?: Total Help needed to walk in hospital room?: Total Help needed climbing 3-5 steps with a railing? : Total 6 Click Score: 6    End of Session   Activity Tolerance: Patient tolerated treatment well Patient left: in bed;with call bell/phone within reach;with bed alarm set Nurse Communication: Other (comment) (pt incontinent of bowels) PT  Visit Diagnosis: Hemiplegia and hemiparesis Hemiplegia - Right/Left: Left Hemiplegia - dominant/non-dominant: Non-dominant Hemiplegia - caused by: Cerebral infarction     Time: 0930-1002 PT Time Calculation (min) (ACUTE ONLY): 32 min  Charges:  $Neuromuscular Re-education: 23-37 mins                      Jeffrey Ashley, PT Pager [phone]    Jeffrey Ashley 03/20/2020, 10:13 AM

## 2020-03-20 NOTE — Progress Notes (Signed)
PROGRESS NOTE    Jeffrey Ashley   WJX:[id number]  DOB: 07/19/1926  DOA: 03/23/2020     9  PCP: Venia Carbon, MD  CC: weakness  Hospital Course: Past medical history of chronic A. fib, dementia, HTN, hypothyroidism, BPH, SDH.  Presents with left-sided weakness found to have MCA stroke SP TPA and thrombectomy on 03/12/2020. Currently further plan is monitor for improvement in mentation.   Interval History:  No events overnight.  Patient seen in room with audible secretions and was unable to cough or clear them.  They were suctioned out prior to exam.  He was shaking his right hand over and over suggesting that he wanted the mitten off.  He was unable to follow any commands and remained nonverbal.  Old records reviewed in assessment of this patient  ROS: Review of systems not obtained due to patient factors.  Cognitive impairment  Assessment & Plan: * Middle cerebral artery embolism, right SP TPA and thrombectomy on 12/11 On aspirin and Lipitor. Appears to be suffering from global slowing post stroke. Concern is whether there will be meaningful improvement in patient's condition. Son would like to discuss with neurology. - at this point given 9 days s/p admission, he is actively aspiration on secretions and progress has been minimal if any; unfortunately poor prognosis at this time; patient would most benefit from transitioning to hospice/comfort care discussions  Dysphagia - remains severe; still failed SLP eval again on 12/20 - keep NPO - patient is actively aspirating during my exam; suctioned him while in room - continue TF but this increases more risk for further aspiration as well - further family discussions pending   Protein-calorie malnutrition, severe -Continue tube feeds although concerned about ongoing aspiration  Hypothyroidism -Continue Synthroid  Essential hypertension - BP still above goal - continue amlodipine; increase to 10 mg daily - continue  coreg  Atrial fibrillation, chronic ([place]) Patient was on anticoagulation with warfarin. Patient developed spontaneous subdural hemorrhage therefore anticoagulation was discontinued. Now back on aspirin. Outpatient follow-up with cardiology for further discussion regarding reinitiation of aspirin although given the age and prior history conservative measures are appropriate     DVT prophylaxis: HSQ Code Status: DNR Family Communication:  Disposition Plan: Status is: Inpatient  Remains inpatient appropriate because:Altered mental status and Inpatient level of care appropriate due to severity of illness   Dispo:  Patient From: Home  Planned Disposition: [place]  Expected discharge date: 03/23/2020  Medically stable for discharge: No        Objective: Blood pressure (!) 138/108, pulse 75, temperature (!) 97.5 F (36.4 C), temperature source Axillary, resp. rate 17, height 6' (1.829 m), weight 88.3 kg, SpO2 98 %.  Examination: General appearance: ill appearing elderly man laying in bed with audible secretions and appears mildly umcomfortable Head: Normocephalic, without obvious abnormality, atraumatic Eyes: EOMI Lungs: coarse sounds bilaterally Heart: regular rate and rhythm and S1, S2 normal Abdomen: soft, ND, BS present Extremities: no edema Skin: warm, dry, intact Neurologic: only moving RUE with purpose; does not follow commands; remains nonverbal  Consultants:   Neurology  Procedures:     Data Reviewed: I have personally reviewed following labs and imaging studies Results for orders placed or performed during the hospital encounter of 03/20/2020 (from the past 24 hour(s))  Glucose, capillary     Status: Abnormal   Collection Time: 03/19/20  5:17 PM  Result Value Ref Range   Glucose-Capillary 150 (H) 70 - 99 mg/dL   Comment  1 Notify RN    Comment 2 Document in Chart   Glucose, capillary     Status: Abnormal   Collection Time: 03/19/20  7:45  PM  Result Value Ref Range   Glucose-Capillary 147 (H) 70 - 99 mg/dL   Comment 1 Notify RN    Comment 2 Document in Chart   Glucose, capillary     Status: Abnormal   Collection Time: 03/20/20 12:15 AM  Result Value Ref Range   Glucose-Capillary 153 (H) 70 - 99 mg/dL   Comment 1 Notify RN    Comment 2 Document in Chart   CBC     Status: Abnormal   Collection Time: 03/20/20  3:13 AM  Result Value Ref Range   WBC 11.3 (H) 4.0 - 10.5 K/uL   RBC 3.94 (L) 4.22 - 5.81 MIL/uL   Hemoglobin 11.7 (L) 13.0 - 17.0 g/dL   HCT 36.3 (L) 39.0 - 52.0 %   MCV 92.1 80.0 - 100.0 fL   MCH 29.7 26.0 - 34.0 pg   MCHC 32.2 30.0 - 36.0 g/dL   RDW 13.9 11.5 - 15.5 %   Platelets 195 150 - 400 K/uL   nRBC 0.0 0.0 - 0.2 %  Basic metabolic panel     Status: Abnormal   Collection Time: 03/20/20  3:13 AM  Result Value Ref Range   Sodium 134 (L) 135 - 145 mmol/L   Potassium 4.8 3.5 - 5.1 mmol/L   Chloride 99 98 - 111 mmol/L   CO2 26 22 - 32 mmol/L   Glucose, Bld 168 (H) 70 - 99 mg/dL   BUN 32 (H) 8 - 23 mg/dL   Creatinine, Ser 0.97 0.61 - 1.24 mg/dL   Calcium 8.6 (L) 8.9 - 10.3 mg/dL   GFR, Estimated >60 >60 mL/min   Anion gap 9 5 - 15  Glucose, capillary     Status: Abnormal   Collection Time: 03/20/20  4:23 AM  Result Value Ref Range   Glucose-Capillary 153 (H) 70 - 99 mg/dL   Comment 1 Notify RN    Comment 2 Document in Chart   Glucose, capillary     Status: Abnormal   Collection Time: 03/20/20  7:54 AM  Result Value Ref Range   Glucose-Capillary 147 (H) 70 - 99 mg/dL   Comment 1 Notify RN    Comment 2 Document in Chart   Glucose, capillary     Status: Abnormal   Collection Time: 03/20/20 11:15 AM  Result Value Ref Range   Glucose-Capillary 124 (H) 70 - 99 mg/dL   Comment 1 Notify RN    Comment 2 Document in Chart     Recent Results (from the past 240 hour(s))  Resp panel by RT-PCR (RSV, Flu A&B, Covid) Nasopharyngeal Swab     Status: None   Collection Time: 03/01/2020 12:18 PM   Specimen:  Nasopharyngeal Swab; Nasopharyngeal(NP) swabs in vial transport medium  Result Value Ref Range Status   SARS Coronavirus 2 by RT PCR NEGATIVE NEGATIVE Final    Comment: (NOTE) SARS-CoV-2 target nucleic acids are NOT DETECTED.  The SARS-CoV-2 RNA is generally detectable in upper respiratory specimens during the acute phase of infection. The lowest concentration of SARS-CoV-2 viral copies this assay can detect is 138 copies/mL. A negative result does not preclude SARS-Cov-2 infection and should not be used as the sole basis for treatment or other patient management decisions. A negative result may occur with  improper specimen collection/handling, submission of specimen other than nasopharyngeal  swab, presence of viral mutation(s) within the areas targeted by this assay, and inadequate number of viral copies(<138 copies/mL). A negative result must be combined with clinical observations, patient history, and epidemiological information. The expected result is Negative.  Fact Sheet for Patients:  EntrepreneurPulse.com.au  Fact Sheet for Healthcare Providers:  IncredibleEmployment.be  This test is no t yet approved or cleared by the [place] FDA and  has been authorized for detection and/or diagnosis of SARS-CoV-2 by FDA under an Emergency Use Authorization (EUA). This EUA will remain  in effect (meaning this test can be used) for the duration of the COVID-19 declaration under Section 564(b)(1) of the Act, 21 U.S.C.section 360bbb-3(b)(1), unless the authorization is terminated  or revoked sooner.       Influenza A by PCR NEGATIVE NEGATIVE Final   Influenza B by PCR NEGATIVE NEGATIVE Final    Comment: (NOTE) The Xpert Xpress SARS-CoV-2/FLU/RSV plus assay is intended as an aid in the diagnosis of influenza from Nasopharyngeal swab specimens and should not be used as a sole basis for treatment. Nasal washings and aspirates are unacceptable for  Xpert Xpress SARS-CoV-2/FLU/RSV testing.  Fact Sheet for Patients: EntrepreneurPulse.com.au  Fact Sheet for Healthcare Providers: IncredibleEmployment.be  This test is not yet approved or cleared by the [place] FDA and has been authorized for detection and/or diagnosis of SARS-CoV-2 by FDA under an Emergency Use Authorization (EUA). This EUA will remain in effect (meaning this test can be used) for the duration of the COVID-19 declaration under Section 564(b)(1) of the Act, 21 U.S.C. section 360bbb-3(b)(1), unless the authorization is terminated or revoked.     Resp Syncytial Virus by PCR NEGATIVE NEGATIVE Final    Comment: (NOTE) Fact Sheet for Patients: EntrepreneurPulse.com.au  Fact Sheet for Healthcare Providers: IncredibleEmployment.be  This test is not yet approved or cleared by the [place] FDA and has been authorized for detection and/or diagnosis of SARS-CoV-2 by FDA under an Emergency Use Authorization (EUA). This EUA will remain in effect (meaning this test can be used) for the duration of the COVID-19 declaration under Section 564(b)(1) of the Act, 21 U.S.C. section 360bbb-3(b)(1), unless the authorization is terminated or revoked.  Performed at [facility], [place] [address], [place], [place] [address]   MRSA PCR Screening     Status: None   Collection Time: 03/13/2020  1:28 PM   Specimen: Nasopharyngeal  Result Value Ref Range Status   MRSA by PCR NEGATIVE NEGATIVE Final    Comment:        The GeneXpert MRSA Assay (FDA approved for NASAL specimens only), is one component of a comprehensive MRSA colonization surveillance program. It is not intended to diagnose MRSA infection nor to guide or monitor treatment for MRSA infections. Performed at [facility], [place] [address], [place], [place] [address]   Culture, respiratory (non-expectorated)     Status: None    Collection Time: 03/27/2020  1:52 PM   Specimen: Tracheal Aspirate; Respiratory  Result Value Ref Range Status   Specimen Description TRACHEAL ASPIRATE  Final   Special Requests NONE  Final   Gram Stain   Final    RARE WBC PRESENT, PREDOMINANTLY PMN NO ORGANISMS SEEN    Culture   Final    RARE Normal respiratory flora-no Staph aureus or Pseudomonas seen Performed at [place] [address] [place], [place] [address]    Report Status 03/13/2020 FINAL  Final     Radiology Studies: No results found.  DG CHEST PORT 1 VIEW  Final Result    DG CHEST PORT 1 VIEW  Final Result    DG Abd Portable 1V  Final Result    MR BRAIN WO CONTRAST  Final Result    MR ANGIO HEAD WO CONTRAST  Final Result    DG Chest Port 1 View  Final Result    DG Abd Portable 1V  Final Result    DG Chest Port 1 View  Final Result    IR PERCUTANEOUS ART THROMBECTOMY/INFUSION INTRACRANIAL INC DIAG ANGIO  Final Result    IR CT Head Ltd  Final Result    CT Code Stroke CTA Head W/WO contrast  Final Result  Addendum 1 of 1  ADDENDUM REPORT: 03/23/2020 10:06    ADDENDUM:  These results were called by telephone at the time of interpretation  on 03/12/2020 at 10:00 am to provider MCNE[facility] , who  verbally acknowledged these results.      Electronically Signed    By: Primitivo Gauze M.D.    On: 03/14/2020 10:06      Final    CT Code Stroke CTA Neck W/WO contrast  Final Result  Addendum 1 of 1  ADDENDUM REPORT: 03/21/2020 10:06    ADDENDUM:  These results were called by telephone at the time of interpretation  on 03/07/2020 at 10:00 am to provider MCNEILL [facility] , who  verbally acknowledged these results.      Electronically Signed    By: Primitivo Gauze M.D.    On: 03/24/2020 10:06      Final    CT HEAD CODE STROKE WO CONTRAST  Final Result      Scheduled Meds:  [START ON 03/21/2020] amLODipine  10 mg Per Tube Daily   aspirin  325 mg Per  Tube Daily   atorvastatin  20 mg Per Tube Daily   carvedilol  25 mg Per Tube BID   chlorhexidine gluconate (MEDLINE KIT)  15 mL Mouth Rinse BID   Chlorhexidine Gluconate Cloth  6 each Topical Daily   cholecalciferol  5,000 Units Per Tube Daily   feeding supplement (PROSource TF)  45 mL Per Tube TID   free water  100 mL Per Tube Q6H   heparin injection (subcutaneous)  5,000 Units Subcutaneous Q8H   latanoprost  1 drop Both Eyes QHS   levothyroxine  50 mcg Per Tube QAC breakfast   mouth rinse  15 mL Mouth Rinse 6 times per day   pantoprazole sodium  40 mg Per Tube Daily   PRN Meds: [DISCONTINUED] acetaminophen **OR** acetaminophen (TYLENOL) oral liquid 160 mg/5 mL **OR** acetaminophen, glycopyrrolate Continuous Infusions:  sodium chloride     feeding supplement (OSMOLITE 1.5 CAL) 1,000 mL (03/19/20 1833)     LOS: 9 days  Time spent: Greater than 50% of the 35 minute visit was spent in counseling/coordination of care for the patient as laid out in the A&P.   Dwyane Dee, MD [facility] 03/20/2020, 2:40 PM

## 2020-03-20 NOTE — Progress Notes (Signed)
STROKE TEAM PROGRESS NOTE   INTERVAL HISTORY Dr Frederick PeersGirguis is at bedside. . Patient is neuro stable. Patient is awake and interactive.  Has severe dysarthria. Follows comannds on the right.  Vital signs stable.   OBJECTIVE Vitals:   03/20/20 0418 03/20/20 0500 03/20/20 0750 03/20/20 1109  BP: 136/85  (!) 147/95 (!) 138/108  Pulse: 79  92 75  Resp:   20 17  Temp: 97.7 F (36.5 C)  97.9 F (36.6 C) (!) 97.5 F (36.4 C)  TempSrc: Axillary  Axillary Axillary  SpO2: 100%  98% 98%  Weight:  88.3 kg    Height:       CBC:  Recent Labs  Lab 03/19/20 0108 03/20/20 0313  WBC 9.0 11.3*  HGB 12.0* 11.7*  HCT 35.2* 36.3*  MCV 90.7 92.1  PLT 174 195   Basic Metabolic Panel:  Recent Labs  Lab 03/14/20 1722 03/15/20 0118 03/15/20 1013 03/16/20 0030 03/19/20 0108 03/20/20 0313  NA  --    < >  --    < > 132* 134*  K  --    < >  --    < > 4.4 4.8  CL  --    < >  --    < > 101 99  CO2  --    < >  --    < > 24 26  GLUCOSE  --    < >  --    < > 164* 168*  BUN  --    < >  --    < > 27* 32*  CREATININE  --    < >  --    < > 0.82 0.97  CALCIUM  --    < >  --    < > 8.4* 8.6*  MG 2.0  --  2.0  --   --   --   PHOS 3.5  --  3.6  --   --   --    < > = values in this interval not displayed.   Lipid Panel:     Component Value Date/Time   CHOL 123 03/12/2020 0054   TRIG 102 03/14/2020 0609   HDL 34 (L) 03/12/2020 0054   CHOLHDL 3.6 03/12/2020 0054   VLDL 18 03/12/2020 0054   LDLCALC 71 03/12/2020 0054   HgbA1c:  Lab Results  Component Value Date   HGBA1C 5.8 (H) 03/12/2020   Urine Drug Screen:     Component Value Date/Time   LABOPIA NEGATIVE 03/13/2012 1900   COCAINSCRNUR NEGATIVE 03/13/2012 1900   LABBENZ NEGATIVE 03/13/2012 1900   AMPHETMU NEGATIVE 03/13/2012 1900   THCU NEGATIVE 03/13/2012 1900   LABBARB NEGATIVE 03/13/2012 1900    Alcohol Level No results found for: ETH  IMAGING  CT HEAD CODE STROKE WO CONTRAST 03/10/2020 1. No acute intracranial process. Mild  cerebral atrophy and chronic microvascular ischemic changes.  2. ASPECTS is 10  3. Remote lacunar insult versus dilated perivascular spaces involving the right basal ganglia, posterior limb of the right internal capsule and left insula.   CT Code Stroke CTA Head W/WO contrast CT Code Stroke CTA Neck W/WO contrast 03/13/2020   Right M1 segment large vessel occlusion. No large vessel occlusion within the neck. 50% right and 30% left proximal ICA luminal narrowing. Mild-to-moderate left vertebral artery origin narrowing. Mild narrowing of the bilateral V4 segments, right P3 and left P2 segments.   MR BRAIN WO CONTRAST MR ANGIO HEAD WO CONTRAST 03/12/2020 Sequela  of acute/subacute right MCA territory infarct. Small acute right cerebellar insult. Interval revascularization of the right MCA. No high-grade intracranial narrowing or large vessel occlusion. Mild narrowing of the right V4, right P2 and left M2 segments.   DG Chest Port 1 View 03/15/2020 1.  Interval endotracheal extubation. 2. There is improved aeration of the lungs without acute airspace opacity. 3.  Cardiomegaly. 03/12/2020 Increased bibasilar opacities. Slight interval retraction of ETT.  03/31/2020 1. Endotracheal tube distal tip in the midthoracic trachea.  2. Bibasilar subsegmental atelectasis.  DG Chest Port 1 View 03/18/2020 Impression: 1. Stable mild cardiomegaly without pulmonary edema.  2. Stable minimal bibasilar atelectasis.  DG Abd Portable 1V 03/13/2020 Orogastric tube tip and side port in distal stomach. No bowel obstruction or free air evident. Apparent staghorn calculi in left kidney, stable. Small pleural effusions bilaterally with bibasilar atelectasis.  03/29/2020 Enteric tube with tip in the distal esophagus, side-port in the mid esophagus. Recommend advancement of greater than 10 cm for optimal placement.   ECHOCARDIOGRAM COMPLETE 03/30/2020 1. Left ventricular ejection fraction, by estimation, is 50  to 55%. The left ventricle has low normal function. The left ventricle has no regional wall motion abnormalities. There is severe concentric left ventricular hypertrophy. Left ventricular diastolic parameters are indeterminate.   2. Right ventricular systolic function is normal. The right ventricular size is normal.   3. Left atrial size was severely dilated.   4. Right atrial size was severely dilated.   5. A small pericardial effusion is present. The pericardial effusion is surrounding the apex.   6. The mitral valve is grossly normal. Trivial mitral valve regurgitation.   7. The aortic valve is tricuspid. There is mild calcification of the aortic valve. Aortic valve regurgitation is mild. No aortic stenosis is present.   8. The inferior vena cava is normal in size with greater than 50% respiratory variability, suggesting right atrial pressure of 3 mmHg. Comparison(s): No prior Echocardiogram.   ECG - atrial fibrillation - ventricular response 81 BPM (See cardiology reading for complete details)   PHYSICAL EXAM   Temp:  [97.5 F (36.4 C)-98.9 F (37.2 C)] 97.5 F (36.4 C) (12/20 1109) Pulse Rate:  [75-92] 75 (12/20 1109) Resp:  [17-20] 17 (12/20 1109) BP: (126-147)/(62-108) 138/108 (12/20 1109) SpO2:  [97 %-100 %] 98 % (12/20 1109) Weight:  [88.3 kg] 88.3 kg (12/20 0500)  General -86-year-old male not in distress.  Ophthalmologic - fundi not visualized due to noncooperation.  Cardiovascular - irregularly irregular heart rate and rhythm.  Neuro -awake and interactive.  Able to follow all simple midline and right hand and foot commands. However, nonverbal with severe anarthria and makes a few guttural sounds. With eye opening, eyes in right gaze preference position, however able to cross midline, although left gaze incomplete. Not blinking to visual threat on the left, blinking on the right, pupils equal bilaterally reactive to light. Left facial droop, tongue midline in mouth but  protrusion not cooperative. RUE spontaneous purposeful movement able to against gravity, able to follow commands on the right hand. RLE proximal 2/5 and knee flexion 3+/5 on pain, LUE flaccid, LLE mild withdraw movement of the knee. DTR 1+ and no babinski. Sensation, coordination not cooperative and gait not tested.   ASSESSMENT/PLAN [name] is a 86 y.o. male with history of hypertension, hypothyroidism, hearing impaired, subdural hematoma in 2013 and atrial fibrillation not on anticoagulation who presents with dysarthria and left-sided weakness. The pt vomited en route and there was  concern for possible aspiration.  The patient received IV t-PA Saturday 03/03/2020 at 9:45 AM. IR 03/10/2020 at 11:40 AM - S/P complete revascularization of RT MCA M1 occlusion achieving a TICI 3 revascularization.  Stroke: Rt MCA and small R cerebellar infarcts - embolic - likely from atrial fibrillation without anticoagulation prior to admission  CT Head -  No acute intracranial process. Remote lacunar insult versus dilated perivascular spaces involving the right basal ganglia, posterior limb of the right internal capsule and left insula.   CTA H&N - Right M1 segment large vessel occlusion. No large vessel occlusion within the neck. 50% right and 30% left proximal ICA luminal narrowing. Mild-to-moderate left vertebral artery origin narrowing. Mild narrowing of the bilateral V4 segments, right P3 and left P2 segments.  MRI / MRA head - R large extensive MCA infarct. Small R cerebellar infarct. Interval revascularization R MCA. Mild narrowing R V4, R P2, L M2  2D Echo - EF 50 - 55%. No cardiac source of emboli identified.   [employer] Virus 2 - negative  LDL - 71  HgbA1c - 5.8  VTE prophylaxis - Heparin 5000 units sq tid   aspirin 81 mg daily prior to admission, now on aspirin 325 mg daily   Therapy recommendations: SNF  Disposition: - if respiratory / swallow condition does not improve over the  weekend, may consider palliative care consult - still NPO. Speech continues to work with pt. Dr. Allena Katz to reach out to patient's son regarding status including possibly palliative care, discussed with Dr. Allena Katz today, appreciate our colleague following along with [place].   Chronic Afib  Chronic afib but not sure about afib burden   Was on coumadin in the past, off after SDH  Never restarted due to advanced age, spontaneous SDH, gross hematuria  Follow with Dr. Marge Duncans at [facility]  Currently rate controlled  Now on ASA. Dr. Allena Katz to reach out to patient's son regarding status including AC in the future, PEG tube if swallowing doesn't improve or palliative care. discussed with Dr. Allena Katz today, appreciate our colleague following along with [place].     Acute Hypoxic Respiratory failure  Intubated on vent ->now extubated  Off sedation  CCM on board  Tolerating post extubation, however still has difficulty with oral secretion  Chest PT  3% saline nebulization  NT suctioning as needed  CXR 12/18 - Stable mild cardiomegaly without pulmonary edema. Stable minimal bibasilar atelectasis  Aspiration pneumonia  Vomited x2 before intubation  T Max - 100.4->afebrile->100.4->99.7  IV Unasyn started 03/04/2020-> off now  [facility] - 6.0->9.1->10.3->8.1->8.3->9.7->8.6  Respiratory cultures - normal respiratory flora.  CXR 12/18 - Stable mild cardiomegaly without pulmonary edema. Stable minimal bibasilar atelectasis.  Hypertension  Home BP meds: Coreg  Current BP meds: Resumed Coreg   Stable . BP goal post tPA < 180/105, gradually normalize in 5-7 days  . Long-term BP goal normotensive  Hyperlipidemia  Home Lipid lowering medication: none   LDL 71, goal < 70  Put on lipitor 20  Continue statin on discharge  Dysphagia  N.p.o.  s/p cortrak 12/15  On TF and FW - Osmolite / ProSource  Speech to follow  Pt remains NPO per speech therapy.  Other Stroke Risk  Factors  Advanced age  Previous ETOH use  Hx of R SDH 2013 s/p craniotomy  Possible remote lacunar by imaging  Other Active Problems, Findings and Recommendations  Code status - Limited code CKD - stage 3a - creatinine - 1.26->1.21->1.25->1.01->0.88->0.90->0.85->0.82 - normalized  and stable. On Vit D Hypothyroid on synthroid On Xalatan    Hematuria - catheter related trauma - monitoring  Hospital day # 9 Discussed with Dr. Frederick Peers  Given patient's advanced age and severe dysarthria and dysphagia unclear if patient will improve significantly and with good quality of life.  He may need PEG tube and rehabilitation in a nursing home situation.  Need to have goals of care discussion with patient's son and make decisions.  I spent 25 minutes of face-to-face and non-face-to-face time with patient. This included prechart review, lab review, study review, order entry, electronic health record documentation, patient education on the different diagnostic and therapeutic options, counseling and coordination of care, risks and benefits of management, compliance, or risk factor reduction Delia Heady, MD  To contact Stroke Continuity provider, please refer to [email or web address]. After hours, contact General Neurology

## 2020-03-20 NOTE — Hospital Course (Addendum)
[name] is a 86 yo male with PMH hypothyroidism, hypertension, macular degeneration, chronic A. fib, dementia, BPH who presented to the hospital with left-sided weakness.  Work-up was ultimately notable for a large right M1 segment LVO.  He underwent administration of TPA and thrombectomy on 03/23/20. Clinically he had minimal improvement and required initiation of tube feeds via coretrak.  He was followed by neurology during hospitalization who also agreed that patient's prognosis was poor with low chance of meaningful recovery. After several days of ongoing monitoring, patient continued to have significant secretions with concern for aspiration due to inability to clear secretions. On 03/21/2020, status was discussed with patient's son, Renae Fickle.  Clinical course and prognosis was discussed.  Renae Fickle was in agreement for pursuing palliative care consult for further goals of care discussions in anticipation of pursuing hospice/comfort care. After discussion with palliative care, the patient was transitioned to comfort care measures.  Tube feeds were discontinued and his core trak was removed. The patient continued to progressively decline and he passed naturally on 04-07-2020.  Per nursing, time of death was 0310.

## 2020-03-20 NOTE — Assessment & Plan Note (Signed)
Continue Synthroid °

## 2020-03-20 NOTE — Assessment & Plan Note (Addendum)
-  Anticoagulation with Coumadin discontinued due to subdural hematoma -Aspirin discontinued now with transition to comfort

## 2020-03-20 NOTE — Assessment & Plan Note (Addendum)
-   TF discontinued.  Okay to remove core trak

## 2020-03-20 NOTE — Progress Notes (Signed)
  Speech Language Pathology Treatment: Dysphagia  Patient Details Name: Jeffrey Ashley MRN: [id number] DOB: 09-26-1926 Today's Date: 03/20/2020 Time: 1120-1130 SLP Time Calculation (min) (ACUTE ONLY): 10 min  Assessment / Plan / Recommendation Clinical Impression  Pt is more alert today but still showing signs concerning for decreased secretion management including audible wetness and coughing at baseline. SLP provided oral care and small amounts of ice chips and thin liquids with passive acceptance and no attempts at oral manipulation or A/P transit despite cueing. The ice chip spilled anteriorly back out of his mouth and thin liquids were suctioned. Pt did not respond to simple yes/no questions during trials to test for accuracy today, but he did occasionally reply to them with gestures in more functional communication context. Recommend that he remain NPO for now. Ability to return to PO diet still remains questionable although MBS not yet completed. Will f/u to see when he may be ready if he starts to try to swallow boluses.    HPI HPI: [name] is 86 yo M w/ dementia presenting w/ acute left sided weakness on code stroke. Received IV TPA and right MCA M1 re-vascularized by neuro ir. Remained intubated due to suspected aspiration. MRI on 12/12 post-intervention showing large right MCA infarct. CXR 12/15: "There is improved aeration of the lungs without acute airspace opacity."      SLP Plan  Continue with current plan of care       Recommendations  Diet recommendations: NPO Medication Administration: Via alternative means                Oral Care Recommendations: Oral care QID Follow up Recommendations: Skilled Nursing facility;24 hour supervision/assistance SLP Visit Diagnosis: Dysphagia, unspecified (R13.10) Plan: Continue with current plan of care       GO                Mahala Menghini., M.A. CCC-SLP Acute Rehabilitation Services Pager [phone] Office  [phone]  03/20/2020, 12:20 PM

## 2020-03-20 NOTE — Assessment & Plan Note (Addendum)
-   s/p TPA and thrombectomy on 12/11 - per CTA head: "Right M1 segment large vessel occlusion." Patient has not improved since admission with aggressive measures - discussed at length with Renae Fickle on phone on 12/21 [facility] consulted; tentative plan is to pursue comfort/hospice but family would like formal palliative care consult at this point -Palliative care has discussed case with family.  At this time patient is being transitioned to comfort care.  Tube feeds are being discontinued and core trak removed. -Family coming for a visit on 03/16/2020 to see patient.

## 2020-03-21 LAB — GLUCOSE, CAPILLARY
Glucose-Capillary: 121 mg/dL — ABNORMAL HIGH (ref 70–99)
Glucose-Capillary: 132 mg/dL — ABNORMAL HIGH (ref 70–99)
Glucose-Capillary: 135 mg/dL — ABNORMAL HIGH (ref 70–99)
Glucose-Capillary: 138 mg/dL — ABNORMAL HIGH (ref 70–99)
Glucose-Capillary: 148 mg/dL — ABNORMAL HIGH (ref 70–99)
Glucose-Capillary: 174 mg/dL — ABNORMAL HIGH (ref 70–99)

## 2020-03-21 LAB — BASIC METABOLIC PANEL
Anion gap: 9 (ref 5–15)
BUN: 30 mg/dL — ABNORMAL HIGH (ref 8–23)
CO2: 26 mmol/L (ref 22–32)
Calcium: 8.7 mg/dL — ABNORMAL LOW (ref 8.9–10.3)
Chloride: 101 mmol/L (ref 98–111)
Creatinine, Ser: 0.9 mg/dL (ref 0.61–1.24)
GFR, Estimated: >60 mL/min (ref >60)
Glucose, Bld: 160 mg/dL — ABNORMAL HIGH (ref 70–99)
Potassium: 4.6 mmol/L (ref 3.5–5.1)
Sodium: 136 mmol/L (ref 135–145)

## 2020-03-21 LAB — CBC WITH DIFFERENTIAL/PLATELET
Abs Immature Granulocytes: 0.17 10*3/uL — ABNORMAL HIGH (ref 0.00–0.07)
Basophils Absolute: 0 10*3/uL (ref 0.0–0.1)
Basophils Relative: 0 %
Eosinophils Absolute: 0.1 10*3/uL (ref 0.0–0.5)
Eosinophils Relative: 1 %
HCT: 36 % — ABNORMAL LOW (ref 39.0–52.0)
Hemoglobin: 12.2 g/dL — ABNORMAL LOW (ref 13.0–17.0)
Immature Granulocytes: 2 %
Lymphocytes Relative: 11 %
Lymphs Abs: 1 10*3/uL (ref 0.7–4.0)
MCH: 31.1 pg (ref 26.0–34.0)
MCHC: 33.9 g/dL (ref 30.0–36.0)
MCV: 91.8 fL (ref 80.0–100.0)
Monocytes Absolute: 1.1 10*3/uL — ABNORMAL HIGH (ref 0.1–1.0)
Monocytes Relative: 12 %
Neutro Abs: 6.7 10*3/uL (ref 1.7–7.7)
Neutrophils Relative %: 74 %
Platelets: 194 10*3/uL (ref 150–400)
RBC: 3.92 MIL/uL — ABNORMAL LOW (ref 4.22–5.81)
RDW: 14.2 % (ref 11.5–15.5)
WBC: 9.1 10*3/uL (ref 4.0–10.5)
nRBC: 0 % (ref 0.0–0.2)

## 2020-03-21 LAB — MAGNESIUM: Magnesium: 2 mg/dL (ref 1.7–2.4)

## 2020-03-21 NOTE — Progress Notes (Signed)
STROKE TEAM PROGRESS NOTE   INTERVAL HISTORY  Patient is neurologically stable. Patient is awake and interactive.  Has severe dysarthria. Follows comannds on the right.  Vital signs stable.   OBJECTIVE Vitals:   03/21/20 0408 03/21/20 0750 03/21/20 1203 03/21/20 1208  BP: 113/82 129/72 140/82 140/82  Pulse: 89 91  88  Resp: 20 16  18   Temp: 97.9 F (36.6 C) 98.9 F (37.2 C)  98.3 F (36.8 C)  TempSrc: Oral Axillary  Oral  SpO2: 96% 97%  99%  Weight: 83.9 kg     Height:       CBC:  Recent Labs  Lab 03/20/20 0313 03/21/20 0152  WBC 11.3* 9.1  NEUTROABS  --  6.7  HGB 11.7* 12.2*  HCT 36.3* 36.0*  MCV 92.1 91.8  PLT 195 194   Basic Metabolic Panel:  Recent Labs  Lab 03/14/20 1722 03/15/20 0118 03/15/20 1013 03/16/20 0030 03/20/20 0313 03/21/20 0152  NA  --    < >  --    < > 134* 136  K  --    < >  --    < > 4.8 4.6  CL  --    < >  --    < > 99 101  CO2  --    < >  --    < > 26 26  GLUCOSE  --    < >  --    < > 168* 160*  BUN  --    < >  --    < > 32* 30*  CREATININE  --    < >  --    < > 0.97 0.90  CALCIUM  --    < >  --    < > 8.6* 8.7*  MG 2.0  --  2.0  --   --  2.0  PHOS 3.5  --  3.6  --   --   --    < > = values in this interval not displayed.   Lipid Panel:     Component Value Date/Time   CHOL 123 03/12/2020 0054   TRIG 102 03/14/2020 0609   HDL 34 (L) 03/12/2020 0054   CHOLHDL 3.6 03/12/2020 0054   VLDL 18 03/12/2020 0054   LDLCALC 71 03/12/2020 0054   HgbA1c:  Lab Results  Component Value Date   HGBA1C 5.8 (H) 03/12/2020   Urine Drug Screen:     Component Value Date/Time   LABOPIA NEGATIVE 03/13/2012 1900   COCAINSCRNUR NEGATIVE 03/13/2012 1900   LABBENZ NEGATIVE 03/13/2012 1900   AMPHETMU NEGATIVE 03/13/2012 1900   THCU NEGATIVE 03/13/2012 1900   LABBARB NEGATIVE 03/13/2012 1900    Alcohol Level No results found for: ETH  IMAGING  CT HEAD CODE STROKE WO CONTRAST 03/21/2020 1. No acute intracranial process. Mild cerebral atrophy  and chronic microvascular ischemic changes.  2. ASPECTS is 10  3. Remote lacunar insult versus dilated perivascular spaces involving the right basal ganglia, posterior limb of the right internal capsule and left insula.   CT Code Stroke CTA Head W/WO contrast CT Code Stroke CTA Neck W/WO contrast 03/28/2020   Right M1 segment large vessel occlusion. No large vessel occlusion within the neck. 50% right and 30% left proximal ICA luminal narrowing. Mild-to-moderate left vertebral artery origin narrowing. Mild narrowing of the bilateral V4 segments, right P3 and left P2 segments.   MR BRAIN WO CONTRAST MR ANGIO HEAD WO CONTRAST 03/12/2020 Sequela of acute/subacute right MCA  territory infarct. Small acute right cerebellar insult. Interval revascularization of the right MCA. No high-grade intracranial narrowing or large vessel occlusion. Mild narrowing of the right V4, right P2 and left M2 segments.   DG Chest Port 1 View 03/15/2020 1.  Interval endotracheal extubation. 2. There is improved aeration of the lungs without acute airspace opacity. 3.  Cardiomegaly. 03/12/2020 Increased bibasilar opacities. Slight interval retraction of ETT.  Dec 26, 2019 1. Endotracheal tube distal tip in the midthoracic trachea.  2. Bibasilar subsegmental atelectasis.  DG Chest Port 1 View 03/18/2020 Impression: 1. Stable mild cardiomegaly without pulmonary edema.  2. Stable minimal bibasilar atelectasis.  DG Abd Portable 1V 03/13/2020 Orogastric tube tip and side port in distal stomach. No bowel obstruction or free air evident. Apparent staghorn calculi in left kidney, stable. Small pleural effusions bilaterally with bibasilar atelectasis.  Dec 26, 2019 Enteric tube with tip in the distal esophagus, side-port in the mid esophagus. Recommend advancement of greater than 10 cm for optimal placement.   ECHOCARDIOGRAM COMPLETE Dec 26, 2019 1. Left ventricular ejection fraction, by estimation, is 50 to 55%. The left  ventricle has low normal function. The left ventricle has no regional wall motion abnormalities. There is severe concentric left ventricular hypertrophy. Left ventricular diastolic parameters are indeterminate.   2. Right ventricular systolic function is normal. The right ventricular size is normal.   3. Left atrial size was severely dilated.   4. Right atrial size was severely dilated.   5. A small pericardial effusion is present. The pericardial effusion is surrounding the apex.   6. The mitral valve is grossly normal. Trivial mitral valve regurgitation.   7. The aortic valve is tricuspid. There is mild calcification of the aortic valve. Aortic valve regurgitation is mild. No aortic stenosis is present.   8. The inferior vena cava is normal in size with greater than 50% respiratory variability, suggesting right atrial pressure of 3 mmHg. Comparison(s): No prior Echocardiogram.   ECG - atrial fibrillation - ventricular response 81 BPM (See cardiology reading for complete details)   PHYSICAL EXAM   Temp:  [97 F (36.1 C)-98.9 F (37.2 C)] 98.3 F (36.8 C) (12/21 1208) Pulse Rate:  [88-100] 88 (12/21 1208) Resp:  [16-20] 18 (12/21 1208) BP: (112-167)/(72-87) 140/82 (12/21 1208) SpO2:  [96 %-100 %] 99 % (12/21 1208) Weight:  [83.9 kg] 83.9 kg (12/21 0408)  General -elderly male not in distress.  Ophthalmologic - fundi not visualized due to noncooperation.  Cardiovascular - irregularly irregular heart rate and rhythm.  Neuro -awake and interactive.  Able to follow all simple midline and right hand and foot commands. However, nonverbal with severe anarthria and makes a few guttural sounds. With eye opening, eyes in right gaze preference position, however able to cross midline, although left gaze incomplete. Not blinking to visual threat on the left, blinking on the right, pupils equal bilaterally reactive to light. Left facial droop, tongue midline in mouth but protrusion not cooperative.  RUE spontaneous purposeful movement able to against gravity, able to follow commands on the right hand. RLE proximal 2/5 and knee flexion 3+/5 on pain, LUE flaccid, LLE mild withdraw movement of the knee. DTR 1+ and no babinski. Sensation, coordination not cooperative and gait not tested.   ASSESSMENT/PLAN [name] is a 86 y.o. male with history of hypertension, hypothyroidism, hearing impaired, subdural hematoma in 2013 and atrial fibrillation not on anticoagulation who presents with dysarthria and left-sided weakness. The pt vomited en route and there was concern for possible aspiration.  The patient received IV t-PA Saturday 24-Mar-2020 at 9:45 AM. IR 2020-03-24 at 11:40 AM - S/P complete revascularization of RT MCA M1 occlusion achieving a TICI 3 revascularization.  Stroke: Rt MCA and small R cerebellar infarcts - embolic - likely from atrial fibrillation without anticoagulation prior to admission  CT Head -  No acute intracranial process. Remote lacunar insult versus dilated perivascular spaces involving the right basal ganglia, posterior limb of the right internal capsule and left insula.   CTA H&N - Right M1 segment large vessel occlusion. No large vessel occlusion within the neck. 50% right and 30% left proximal ICA luminal narrowing. Mild-to-moderate left vertebral artery origin narrowing. Mild narrowing of the bilateral V4 segments, right P3 and left P2 segments.  MRI / MRA head - R large extensive MCA infarct. Small R cerebellar infarct. Interval revascularization R MCA. Mild narrowing R V4, R P2, L M2  2D Echo - EF 50 - 55%. No cardiac source of emboli identified.   [employer] Virus 2 - negative  LDL - 71  HgbA1c - 5.8  VTE prophylaxis - Heparin 5000 units sq tid   aspirin 81 mg daily prior to admission, now on aspirin 325 mg daily   Therapy recommendations: SNF  Disposition: - if respiratory / swallow condition does not improve over the weekend, may consider  palliative care consult - still NPO. Speech continues to work with pt. Dr. Allena Katz to reach out to patient's son regarding status including possibly palliative care, discussed with Dr. Allena Katz today, appreciate our colleague following along with [place].   Chronic Afib  Chronic afib but not sure about afib burden   Was on coumadin in the past, off after SDH  Never restarted due to advanced age, spontaneous SDH, gross hematuria  Follow with Dr. Marge Duncans at [place] [facility]  Currently rate controlled  Now on ASA. Dr. Frederick Peers to reach out to patient's son regarding status including AC in the future, PEG tube if swallowing doesn't improve or palliative care. discussed with Dr. Frederick Peers today, appreciate our colleague following along with [place].     Acute Hypoxic Respiratory failure  Intubated on vent ->now extubated  Off sedation  CCM on board  Tolerating post extubation, however still has difficulty with oral secretion  Chest PT  3% saline nebulization  NT suctioning as needed  CXR 12/18 - Stable mild cardiomegaly without pulmonary edema. Stable minimal bibasilar atelectasis  Aspiration pneumonia  Vomited x2 before intubation  T Max - 100.4->afebrile->100.4->99.7  IV Unasyn started 03/24/2020-> off now  [facility] - 6.0->9.1->10.3->8.1->8.3->9.7->8.6  Respiratory cultures - normal respiratory flora.  CXR 12/18 - Stable mild cardiomegaly without pulmonary edema. Stable minimal bibasilar atelectasis.  Hypertension  Home BP meds: Coreg  Current BP meds: Resumed Coreg   Stable . BP goal post tPA < 180/105, gradually normalize in 5-7 days  . Long-term BP goal normotensive  Hyperlipidemia  Home Lipid lowering medication: none   LDL 71, goal < 70  Put on lipitor 20  Continue statin on discharge  Dysphagia  N.p.o.  s/p cortrak 12/15  On TF and FW - Osmolite / [employer]  Speech to follow  Pt remains NPO per speech therapy.  Other Stroke Risk Factors  Advanced  age  Previous ETOH use  Hx of R SDH 2013 s/p craniotomy  Possible remote lacunar by imaging  Other Active Problems, Findings and Recommendations  Code status - Limited code CKD - stage 3a - creatinine - 1.26->1.21->1.25->1.01->0.88->0.90->0.85->0.82 - normalized and stable. On Vit D  Hypothyroid on synthroid On Xalatan    Hematuria - catheter related trauma - monitoring  Hospital day # 10 Discussed with Dr. Frederick Peers  Given patient's advanced age and severe dysarthria and dysphagia unclear if patient will improve significantly and with good quality of life.  He may need PEG tube and rehabilitation in a nursing home situation.  Need to have goals of care discussion with patient's son and make decisions.   Delia Heady, MD  To contact Stroke Continuity provider, please refer to [email or web address]. After hours, contact General Neurology

## 2020-03-21 NOTE — Plan of Care (Signed)
Not progressing due to altered mental status and confusion.   Problem: Education: Goal: Knowledge of disease or condition will improve Outcome: Not Progressing Goal: Knowledge of secondary prevention will improve Outcome: Not Progressing Goal: Knowledge of patient specific risk factors addressed and post discharge goals established will improve Outcome: Not Progressing Goal: Individualized Educational Video(s) Outcome: Not Progressing   Problem: Coping: Goal: Will verbalize positive feelings about self Outcome: Not Progressing Goal: Will identify appropriate support needs Outcome: Not Progressing   Problem: Health Behavior/Discharge Planning: Goal: Ability to manage health-related needs will improve Outcome: Not Progressing   Problem: Self-Care: Goal: Ability to participate in self-care as condition permits will improve Outcome: Not Progressing Goal: Verbalization of feelings and concerns over difficulty with self-care will improve Outcome: Not Progressing Goal: Ability to communicate needs accurately will improve Outcome: Not Progressing   Problem: Nutrition: Goal: Risk of aspiration will decrease Outcome: Not Progressing Goal: Dietary intake will improve Outcome: Not Progressing   Problem: Ischemic Stroke/TIA Tissue Perfusion: Goal: Complications of ischemic stroke/TIA will be minimized Outcome: Not Progressing   Problem: Safety: Goal: Non-violent Restraint(s) Outcome: Not Progressing

## 2020-03-21 NOTE — Progress Notes (Signed)
PROGRESS NOTE    Jeffrey Ashley   VCB:[id number]  DOB: [birth year]  DOA: 03/28/2020     10  PCP: Venia Carbon, MD  CC: weakness  Hospital Course: Jeffrey Ashley is a 86 yo male with PMH hypothyroidism, hypertension, macular degeneration, chronic A. fib, dementia, BPH who presented to the hospital with left-sided weakness.  Work-up was ultimately notable for a large right M1 segment LVO.  He underwent administration of TPA and thrombectomy on 03/12/2020. Clinically he had minimal improvement and required initiation of tube feeds via coretrak.  He was followed by neurology during hospitalization who also agreed that patient's prognosis was poor with low chance of meaningful recovery. After several days of ongoing monitoring, patient continued to have significant secretions with concern for aspiration due to inability to clear secretions. On 03/21/2020, status was discussed with patient's son, Jeffrey Ashley.  Clinical course and prognosis was discussed.  Jeffrey Ashley was in agreement for pursuing palliative care consult for further goals of care discussions in anticipation of pursuing hospice/comfort care.   Interval History:  Spoke with patient's son, Jeffrey Ashley (physician) this afternoon to discuss clinical status.  Explained that patient has failed to improve and has continued to decline and his prognosis remains poor with patient now actively approaching end-of-life.  Jeffrey Ashley does understand especially given seeing the patient's imaging scans on admission and his lack of improvement. At this time, he is requesting for palliative care to be formally consulted to help guide family discussions and they understand the recommendation for pursuing a hospice/comfort care route.  Old records reviewed in assessment of this patient  ROS: Review of systems not obtained due to patient factors.  Cognitive impairment  Assessment & Plan: * Middle cerebral artery embolism, right - s/p TPA and thrombectomy on 12/11 -  per CTA head: "Right M1 segment large vessel occlusion." Patient has not improved since admission with aggressive measures - discussed at length with Jeffrey Ashley on phone on 12/21 [facility] consulted; tentative plan is to pursue comfort/hospice but family would like formal palliative care consult at this point - continuing meds and TF (if able to tolerate) until [place] discussions take place - keep DNR/DNI  Dysphagia - remains severe; still failed SLP eval again on 12/20 - keep NPO - still with considerable audible secretions; drooling on exam today - hold TF for now if concern for aspiration; will resume if/when able  Protein-calorie malnutrition, severe - TF on hold due to concern for aspiration at this time; will resume as able   Hypothyroidism -Continue Synthroid  Essential hypertension - BP improved now - continue amlodipine and coreg  Atrial fibrillation, chronic (HCC) -Anticoagulation with Coumadin discontinued due to subdural hematoma -Remains on aspirin only   DVT prophylaxis: HSQ Code Status: DNR Family Communication:  Disposition Plan: Status is: Inpatient  Remains inpatient appropriate because:Altered mental status and Inpatient level of care appropriate due to severity of illness   Dispo:  Patient From: Home  Planned Disposition: Possibly hospice  Expected discharge date: 03/23/2020  Medically stable for discharge: No  Objective: Blood pressure 140/82, pulse 88, temperature 98.3 F (36.8 C), temperature source Oral, resp. rate 18, height 6' (1.829 m), weight 83.9 kg, SpO2 99 %.  Examination: General appearance: ill appearing elderly man laying in bed with audible secretions and appears mildly umcomfortable Head: Normocephalic, without obvious abnormality, atraumatic Eyes: EOMI Lungs: coarse sounds bilaterally Heart: regular rate and rhythm and S1, S2 normal Abdomen: soft, ND, BS present Extremities: no edema Skin: warm, dry, intact  Neurologic: only moving RUE with  purpose; does not follow commands; remains nonverbal  Consultants:   Neurology  Palliative care medicine  Procedures:     Data Reviewed: I have personally reviewed following labs and imaging studies Results for orders placed or performed during the hospital encounter of 03/22/2020 (from the past 24 hour(s))  Glucose, capillary     Status: Abnormal   Collection Time: 03/20/20  4:40 PM  Result Value Ref Range   Glucose-Capillary 139 (H) 70 - 99 mg/dL   Comment 1 Notify RN    Comment 2 Document in Chart   Glucose, capillary     Status: Abnormal   Collection Time: 03/20/20  8:20 PM  Result Value Ref Range   Glucose-Capillary 127 (H) 70 - 99 mg/dL  Glucose, capillary     Status: Abnormal   Collection Time: 03/21/20 12:06 AM  Result Value Ref Range   Glucose-Capillary 132 (H) 70 - 99 mg/dL  Basic metabolic panel     Status: Abnormal   Collection Time: 03/21/20  1:52 AM  Result Value Ref Range   Sodium 136 135 - 145 mmol/L   Potassium 4.6 3.5 - 5.1 mmol/L   Chloride 101 98 - 111 mmol/L   CO2 26 22 - 32 mmol/L   Glucose, Bld 160 (H) 70 - 99 mg/dL   BUN 30 (H) 8 - 23 mg/dL   Creatinine, Ser 0.90 0.61 - 1.24 mg/dL   Calcium 8.7 (L) 8.9 - 10.3 mg/dL   GFR, Estimated >60 >60 mL/min   Anion gap 9 5 - 15  CBC with Differential/Platelet     Status: Abnormal   Collection Time: 03/21/20  1:52 AM  Result Value Ref Range   WBC 9.1 4.0 - 10.5 K/uL   RBC 3.92 (L) 4.22 - 5.81 MIL/uL   Hemoglobin 12.2 (L) 13.0 - 17.0 g/dL   HCT 36.0 (L) 39.0 - 52.0 %   MCV 91.8 80.0 - 100.0 fL   MCH 31.1 26.0 - 34.0 pg   MCHC 33.9 30.0 - 36.0 g/dL   RDW 14.2 11.5 - 15.5 %   Platelets 194 150 - 400 K/uL   nRBC 0.0 0.0 - 0.2 %   Neutrophils Relative % 74 %   Neutro Abs 6.7 1.7 - 7.7 K/uL   Lymphocytes Relative 11 %   Lymphs Abs 1.0 0.7 - 4.0 K/uL   Monocytes Relative 12 %   Monocytes Absolute 1.1 (H) 0.1 - 1.0 K/uL   Eosinophils Relative 1 %   Eosinophils Absolute 0.1 0.0 - 0.5 K/uL   Basophils  Relative 0 %   Basophils Absolute 0.0 0.0 - 0.1 K/uL   Immature Granulocytes 2 %   Abs Immature Granulocytes 0.17 (H) 0.00 - 0.07 K/uL  Magnesium     Status: None   Collection Time: 03/21/20  1:52 AM  Result Value Ref Range   Magnesium 2.0 1.7 - 2.4 mg/dL  Glucose, capillary     Status: Abnormal   Collection Time: 03/21/20  4:07 AM  Result Value Ref Range   Glucose-Capillary 174 (H) 70 - 99 mg/dL  Glucose, capillary     Status: Abnormal   Collection Time: 03/21/20  7:56 AM  Result Value Ref Range   Glucose-Capillary 135 (H) 70 - 99 mg/dL   Comment 1 Notify RN    Comment 2 Document in Chart   Glucose, capillary     Status: Abnormal   Collection Time: 03/21/20 12:10 PM  Result Value Ref Range  Glucose-Capillary 121 (H) 70 - 99 mg/dL   Comment 1 Notify RN    Comment 2 Document in Chart     No results found for this or any previous visit (from the past 240 hour(s)).   Radiology Studies: No results found. DG CHEST PORT 1 VIEW  Final Result    DG CHEST PORT 1 VIEW  Final Result    DG Abd Portable 1V  Final Result    MR BRAIN WO CONTRAST  Final Result    MR ANGIO HEAD WO CONTRAST  Final Result    DG Chest Port 1 View  Final Result    DG Abd Portable 1V  Final Result    DG Chest Port 1 View  Final Result    IR PERCUTANEOUS ART THROMBECTOMY/INFUSION INTRACRANIAL INC DIAG ANGIO  Final Result    IR CT Head Ltd  Final Result    CT Code Stroke CTA Head W/WO contrast  Final Result  Addendum 1 of 1  ADDENDUM REPORT: 03/30/2020 10:06    ADDENDUM:  These results were called by telephone at the time of interpretation  on 03/16/2020 at 10:00 am to provider [facility]NEILL [facility] , who  verbally acknowledged these results.      Electronically Signed    By: Primitivo Gauze M.D.    On: 03/01/2020 10:06      Final    CT Code Stroke CTA Neck W/WO contrast  Final Result  Addendum 1 of 1  ADDENDUM REPORT: 03/17/2020 10:06    ADDENDUM:  These results were  called by telephone at the time of interpretation  on 03/04/2020 at 10:00 am to provider [facility] , who  verbally acknowledged these results.      Electronically Signed    By: Primitivo Gauze M.D.    On: 03/21/2020 10:06      Final    CT HEAD CODE STROKE WO CONTRAST  Final Result      Scheduled Meds: . amLODipine  10 mg Per Tube Daily  . aspirin  325 mg Per Tube Daily  . atorvastatin  20 mg Per Tube Daily  . carvedilol  25 mg Per Tube BID  . chlorhexidine gluconate (MEDLINE KIT)  15 mL Mouth Rinse BID  . Chlorhexidine Gluconate Cloth  6 each Topical Daily  . cholecalciferol  5,000 Units Per Tube Daily  . feeding supplement (PROSource TF)  45 mL Per Tube TID  . free water  100 mL Per Tube Q6H  . heparin injection (subcutaneous)  5,000 Units Subcutaneous Q8H  . latanoprost  1 drop Both Eyes QHS  . levothyroxine  50 mcg Per Tube QAC breakfast  . mouth rinse  15 mL Mouth Rinse 6 times per day  . pantoprazole sodium  40 mg Per Tube Daily   PRN Meds: [DISCONTINUED] acetaminophen **OR** acetaminophen (TYLENOL) oral liquid 160 mg/5 mL **OR** acetaminophen, glycopyrrolate Continuous Infusions: . sodium chloride    . feeding supplement (OSMOLITE 1.5 CAL) 60 mL/hr at 03/21/20 1217     LOS: 10 days  Time spent: Greater than 50% of the 35 minute visit was spent in counseling/coordination of care for the patient as laid out in the A&P.   Dwyane Dee, MD [facility] 03/21/2020, 2:59 PM

## 2020-03-21 NOTE — Progress Notes (Signed)
Occupational Therapy Treatment Patient Details Name: Jeffrey Ashley MRN: [id number] DOB: Aug 24, 1926 Today's Date: 03/21/2020    History of present illness Patient is a 86 y/o male who presents with left sided weakness. 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 NIH:18. s/p tpA and complete revascularization right MCA 12/11. Remained intubated due to concerns for aspiration. Extubated 12/14. PMH includes HTN and Dysrhythmia.   OT comments  Pt demonstrates attention to the R side with name call and reaching with R UE. Pt resistant to EOB sitting and moving bil LE back onto bed surface without help. Pt noted to produce large volume of secretions with position change and suctioned provided by OT. Recommendation SNf at this time. Pt will require total +2 total (A) for transfer. Possible need to hoyer lift to the chair and then attempt standing.   Follow Up Recommendations  SNF;Supervision/Assistance - 24 hour    Equipment Recommendations  Wheelchair (measurements OT);Wheelchair cushion (measurements OT);Hospital bed (hoyer)    Recommendations for Other Services      Precautions / Restrictions Precautions Precautions: Fall;Other (comment) Precaution Comments: left hemi, left neglect, cortrak       Mobility Bed Mobility Overal bed mobility: Needs Assistance Bed Mobility: Rolling;Supine to Sit;Sit to Supine Rolling: Max assist   Supine to sit: Max assist Sit to supine: Mod assist   General bed mobility comments: pt rolling to the weight bear L side and pressure relief to buttock. pt pushing with R UE. pt in sitting pushing posterior to return to supine. pt able to lift bil LE back onto bed surface  Transfers                 General transfer comment: unable to attempt due to pt putting himself back onto the bed    Balance Overall balance assessment: Needs assistance Sitting-balance support: Single extremity supported;Feet supported Sitting balance-Leahy Scale: Poor          Standing balance comment: unable to assess at thist ime                           ADL either performed or assessed with clinical judgement   ADL Overall ADL's : Needs assistance/impaired     Grooming: Oral care;Moderate assistance;Bed level Grooming Details (indicate cue type and reason): pt placing yonker to mouth but overshooting needing (A) to correctly place                               General ADL Comments: session bed level due to eob attempts were terminated by patient     Vision       Perception     Praxis      Cognition Arousal/Alertness: Awake/alert Behavior During Therapy: Flat affect;Restless Overall Cognitive Status: Difficult to assess                                 General Comments: pt pushing with R UE and insisting on bed level . pt resisting EOB sitting        Exercises Other Exercises Other Exercises: PROM L UE Other Exercises: actively moving R UE to touch objects and reaching for therapist with name call   Shoulder Instructions       General Comments redness noted at sacrum and buttock area. pressure relief provided during session    Pertinent Vitals/ Pain  Pain Assessment: No/denies pain  Home Living                                          Prior Functioning/Environment              Frequency  Min 2X/week        Progress Toward Goals  OT Goals(current goals can now be found in the care plan section)  Progress towards OT goals: Progressing toward goals  Acute Rehab OT Goals Patient Stated Goal: not stated OT Goal Formulation: Patient unable to participate in goal setting Time For Goal Achievement: 03/28/20 Potential to Achieve Goals: Good ADL Goals Pt Will Perform Grooming: with min assist;sitting;bed level Pt Will Perform Upper Body Bathing: with mod assist;sitting;bed level Pt/caregiver will Perform Home Exercise Program: Increased strength;Increased  ROM;Left upper extremity;With minimal assist;With written HEP provided Additional ADL Goal #1: Pt will participate in further EOB/OOB mobility assessment as able. Additional ADL Goal #2: Pt will attend to L visual field/L side of body during functional task with no more than min cues.  Plan Discharge plan remains appropriate;Frequency remains appropriate    Co-evaluation                 AM-PAC OT "6 Clicks" Daily Activity     Outcome Measure   Help from another person eating meals?: Total Help from another person taking care of personal grooming?: A Lot Help from another person toileting, which includes using toliet, bedpan, or urinal?: Total Help from another person bathing (including washing, rinsing, drying)?: Total Help from another person to put on and taking off regular upper body clothing?: Total Help from another person to put on and taking off regular lower body clothing?: Total 6 Click Score: 7    End of Session    OT Visit Diagnosis: Other abnormalities of gait and mobility (R26.89);Muscle weakness (generalized) (M62.81);Hemiplegia and hemiparesis Hemiplegia - Right/Left: Left Hemiplegia - dominant/non-dominant: Non-Dominant   Activity Tolerance Patient tolerated treatment well   Patient Left in bed;with call bell/phone within reach;with bed alarm set   Nurse Communication Mobility status;Precautions        Time: 1050-1105 OT Time Calculation (min): 15 min  Charges: OT General Charges $OT Visit: 1 Visit OT Treatments $Therapeutic Activity: 8-22 mins   Brynn, OTR/L  Acute Rehabilitation Services Pager: [phone] Office: [phone] .    Mateo Flow 03/21/2020, 12:10 PM

## 2020-03-21 NOTE — Care Management Important Message (Signed)
Important Message  Patient Details  Name: Jeffrey Ashley MRN: [id number] Date of Birth: Apr 24, 1926   Medicare Important Message Given:  Yes     [name] 03/21/2020, 2:46 PM

## 2020-03-22 LAB — CBC WITH DIFFERENTIAL/PLATELET
Abs Immature Granulocytes: 0.18 10*3/uL — ABNORMAL HIGH (ref 0.00–0.07)
Basophils Absolute: 0.1 10*3/uL (ref 0.0–0.1)
Basophils Relative: 1 %
Eosinophils Absolute: 0.2 10*3/uL (ref 0.0–0.5)
Eosinophils Relative: 2 %
HCT: 34.9 % — ABNORMAL LOW (ref 39.0–52.0)
Hemoglobin: 11.7 g/dL — ABNORMAL LOW (ref 13.0–17.0)
Immature Granulocytes: 2 %
Lymphocytes Relative: 11 %
Lymphs Abs: 1 10*3/uL (ref 0.7–4.0)
MCH: 30.9 pg (ref 26.0–34.0)
MCHC: 33.5 g/dL (ref 30.0–36.0)
MCV: 92.1 fL (ref 80.0–100.0)
Monocytes Absolute: 0.9 10*3/uL (ref 0.1–1.0)
Monocytes Relative: 10 %
Neutro Abs: 6.6 10*3/uL (ref 1.7–7.7)
Neutrophils Relative %: 74 %
Platelets: 184 10*3/uL (ref 150–400)
RBC: 3.79 MIL/uL — ABNORMAL LOW (ref 4.22–5.81)
RDW: 14.2 % (ref 11.5–15.5)
WBC: 9 10*3/uL (ref 4.0–10.5)
nRBC: 0 % (ref 0.0–0.2)

## 2020-03-22 LAB — GLUCOSE, CAPILLARY
Glucose-Capillary: 130 mg/dL — ABNORMAL HIGH (ref 70–99)
Glucose-Capillary: 138 mg/dL — ABNORMAL HIGH (ref 70–99)
Glucose-Capillary: 145 mg/dL — ABNORMAL HIGH (ref 70–99)
Glucose-Capillary: 145 mg/dL — ABNORMAL HIGH (ref 70–99)
Glucose-Capillary: 160 mg/dL — ABNORMAL HIGH (ref 70–99)
Glucose-Capillary: 160 mg/dL — ABNORMAL HIGH (ref 70–99)

## 2020-03-22 LAB — BASIC METABOLIC PANEL
Anion gap: 10 (ref 5–15)
BUN: 38 mg/dL — ABNORMAL HIGH (ref 8–23)
CO2: 25 mmol/L (ref 22–32)
Calcium: 8.5 mg/dL — ABNORMAL LOW (ref 8.9–10.3)
Chloride: 100 mmol/L (ref 98–111)
Creatinine, Ser: 0.9 mg/dL (ref 0.61–1.24)
GFR, Estimated: >60 mL/min (ref >60)
Glucose, Bld: 156 mg/dL — ABNORMAL HIGH (ref 70–99)
Potassium: 4.5 mmol/L (ref 3.5–5.1)
Sodium: 135 mmol/L (ref 135–145)

## 2020-03-22 LAB — MAGNESIUM: Magnesium: 2 mg/dL (ref 1.7–2.4)

## 2020-03-22 MED ORDER — HALOPERIDOL LACTATE 5 MG/ML IJ SOLN
2.0000 mg | INTRAMUSCULAR | Status: DC | PRN
  Administered 2020-03-24 – 2020-03-25 (×2): 2 mg via INTRAVENOUS
  Filled 2020-03-22 (×2): qty 1

## 2020-03-22 NOTE — Progress Notes (Signed)
Physical Therapy Treatment Patient Details Name: Jeffrey Ashley MRN: [id number] DOB: 02-Apr-1926 Today's Date: 03/22/2020    History of Present Illness Patient is a 86 y/o male who presents with left sided weakness. 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 NIH:18. s/p tpA and complete revascularization right MCA 12/11. Remained intubated due to concerns for aspiration. Extubated 12/14. PMH includes HTN and Dysrhythmia.    PT Comments    Pt tolerates treatment well, following commands with RUE and RLE. Pt able to turn head to left side with minA and tactile cueing to attend to objects in that direction. Pt with improved sitting balance with use of RUE support this session. Pt continues to requires significant physical assistance to perform all functional mobility tasks and remains at a high falls risk. PT continues to recommend SNF placement at this time.   Follow Up Recommendations  SNF;Supervision for mobility/OOB     Equipment Recommendations  Wheelchair (measurements PT);Wheelchair cushion (measurements PT);Hospital bed (mechanical lift)    Recommendations for Other Services       Precautions / Restrictions Precautions Precautions: Fall;Other (comment) Precaution Comments: left hemi, left neglect, cortrak Restrictions Weight Bearing Restrictions: No    Mobility  Bed Mobility Overal bed mobility: Needs Assistance Bed Mobility: Supine to Sit     Supine to sit: Max assist;+2 for physical assistance     General bed mobility comments: pt bringing legs back onto bed multiple times with PT attempts to initiate bed mobility  Transfers Overall transfer level: Needs assistance Equipment used: 2 person hand held assist Transfers: Sit to/from Stand;Stand Pivot Transfers Sit to Stand: Max assist;+2 safety/equipment;+2 physical assistance;From elevated surface Stand pivot transfers: Max assist;+2 safety/equipment;+2 physical assistance;From elevated surface       General transfer  comment: bilateral UE support, PT L knee block  Ambulation/Gait                 Stairs             Wheelchair Mobility    Modified Rankin (Stroke Patients Only) Modified Rankin (Stroke Patients Only) Pre-Morbid Rankin Score: Moderately severe disability Modified Rankin: Severe disability     Balance Overall balance assessment: Needs assistance Sitting-balance support: Single extremity supported;Feet supported Sitting balance-Leahy Scale: Poor Sitting balance - Comments: minA with RUE support, otherwise mod-maxA without UE support, intermittent pushing to left side Postural control: Left lateral lean Standing balance support: Bilateral upper extremity supported Standing balance-Leahy Scale: Zero Standing balance comment: maxA x2 to stand                            Cognition Arousal/Alertness: Awake/alert Behavior During Therapy: Flat affect;Restless;Impulsive Overall Cognitive Status: Difficult to assess Area of Impairment: Following commands;Safety/judgement;Problem solving                   Current Attention Level: Sustained   Following Commands: Follows one step commands with increased time Safety/Judgement: Decreased awareness of safety;Decreased awareness of deficits   Problem Solving: Slow processing;Requires verbal cues;Requires tactile cues (visual cues)        Exercises Other Exercises Other Exercises: dynamic reaching with RUE toward L side Other Exercises: cervical rotation toward left to attend to L side and people on left side    General Comments General comments (skin integrity, edema, etc.): VSS on RA      Pertinent Vitals/Pain Pain Assessment: Faces Faces Pain Scale: No hurt    Home Living  Prior Function            PT Goals (current goals can now be found in the care plan section) Acute Rehab PT Goals Patient Stated Goal: none stated Progress towards PT goals: Progressing  toward goals    Frequency    Min 3X/week      PT Plan Current plan remains appropriate    Co-evaluation PT/OT/SLP Co-Evaluation/Treatment: Yes Reason for Co-Treatment: Complexity of the patient's impairments (multi-system involvement);Necessary to address cognition/behavior during functional activity;For patient/therapist safety;To address functional/ADL transfers PT goals addressed during session: Mobility/safety with mobility;Balance;Strengthening/ROM        AM-PAC PT "6 Clicks" Mobility   Outcome Measure  Help needed turning from your back to your side while in a flat bed without using bedrails?: A Lot Help needed moving from lying on your back to sitting on the side of a flat bed without using bedrails?: A Lot Help needed moving to and from a bed to a chair (including a wheelchair)?: A Lot Help needed standing up from a chair using your arms (e.g., wheelchair or bedside chair)?: A Lot Help needed to walk in hospital room?: Total Help needed climbing 3-5 steps with a railing? : Total 6 Click Score: 10    End of Session Equipment Utilized During Treatment: Gait belt Activity Tolerance: Patient tolerated treatment well Patient left: in chair;with call bell/phone within reach;with chair alarm set Nurse Communication: Mobility status;Need for lift equipment PT Visit Diagnosis: Hemiplegia and hemiparesis Hemiplegia - Right/Left: Left Hemiplegia - dominant/non-dominant: Non-dominant Hemiplegia - caused by: Cerebral infarction     Time: 2956-2130 PT Time Calculation (min) (ACUTE ONLY): 24 min  Charges:  $Therapeutic Activity: 8-22 mins                     Arlyss Gandy, PT, DPT Acute Rehabilitation Pager: [phone]    Arlyss Gandy 03/22/2020, 12:17 PM

## 2020-03-22 NOTE — Plan of Care (Signed)
  Problem: Education: Goal: Knowledge of disease or condition will improve Outcome: Not Progressing   Problem: Coping: Goal: Will identify appropriate support needs Outcome: Not Progressing   Problem: Self-Care: Goal: Ability to participate in self-care as condition permits will improve Outcome: Not Progressing   Problem: Nutrition: Goal: Risk of aspiration will decrease Outcome: Not Progressing

## 2020-03-22 NOTE — Progress Notes (Signed)
Occupational Therapy Treatment Patient Details Name: Jeffrey Ashley MRN: [id number] DOB: October 12, 1926 Today's Date: 03/22/2020    History of present illness Patient is a 86 y/o male who presents with left sided weakness. 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 NIH:18. s/p tpA and complete revascularization right MCA 12/11. Remained intubated due to concerns for aspiration. Extubated 12/14. PMH includes HTN and Dysrhythmia.   OT comments  Pt completed OOB to chair transfer this session total +2 max (A) with LLE blocked. Recommend HOYER LIFT for OOB with staff. Pt more alert and following simple commands once up in chair. Recommendation for SNF remain appropriate.    Follow Up Recommendations  SNF;Supervision/Assistance - 24 hour    Equipment Recommendations  Wheelchair (measurements OT);Wheelchair cushion (measurements OT);Hospital bed    Recommendations for Other Services      Precautions / Restrictions Precautions Precautions: Fall;Other (comment) Precaution Comments: left hemi, left neglect, cortrak Restrictions Weight Bearing Restrictions: No       Mobility Bed Mobility Overal bed mobility: Needs Assistance Bed Mobility: Supine to Sit     Supine to sit: Max assist;+2 for physical assistance     General bed mobility comments: pt bringing legs back onto bed multiple times with PT attempts to initiate bed mobility. OT holding pt in midline due to posterior lean. pt pushing with R UE and needs repositioning of RUE  Transfers Overall transfer level: Needs assistance Equipment used: 2 person hand held assist Transfers: Sit to/from [employer] Transfers Sit to Stand: Max assist;+2 safety/equipment;+2 physical assistance;From elevated surface Stand pivot transfers: Max assist;+2 safety/equipment;+2 physical assistance;From elevated surface       General transfer comment: bilateral UE support L knee block    Balance Overall balance assessment: Needs  assistance Sitting-balance support: Single extremity supported;Feet supported Sitting balance-Leahy Scale: Poor Sitting balance - Comments: minA with RUE support, otherwise mod-maxA without UE support, intermittent pushing to left side Postural control: Left lateral lean Standing balance support: Bilateral upper extremity supported Standing balance-Leahy Scale: Zero Standing balance comment: maxA x2 to stand                           ADL either performed or assessed with clinical judgement   ADL Overall ADL's : Needs assistance/impaired Eating/Feeding: NPO Eating/Feeding Details (indicate cue type and reason): positioned in chair for SLP to start             Upper Body Dressing : Maximal assistance Upper Body Dressing Details (indicate cue type and reason): doff and don new gown due to oral secretions Lower Body Dressing: Total assistance Lower Body Dressing Details (indicate cue type and reason): don socks Toilet Transfer: +2 for physical assistance;Maximal assistance Toilet Transfer Details (indicate cue type and reason): pt does not step at all and requires positioning by therapist. recommend Stedy due to lack of movement of bil LE                 Vision       Perception     Praxis      Cognition Arousal/Alertness: Awake/alert Behavior During Therapy: Flat affect;Restless;Impulsive Overall Cognitive Status: Difficult to assess Area of Impairment: Following commands;Safety/judgement;Problem solving                   Current Attention Level: Sustained   Following Commands: Follows one step commands with increased time Safety/Judgement: Decreased awareness of safety;Decreased awareness of deficits   Problem Solving: Slow processing;Requires verbal cues;Requires  tactile cues (visual cues) General Comments: pt pushing with R UE and grabbing at anything that touches R UE . pt able to follow simple commmands in visual field. pt requires (A) to  turn head to the L and visually attend.        Exercises Exercises: Other exercises Other Exercises Other Exercises: dynamic reaching with RUE toward L side Other Exercises: cervical rotation toward left to attend to L side and people on left side   Shoulder Instructions       General Comments VSS RA    Pertinent Vitals/ Pain       Pain Assessment: No/denies pain Faces Pain Scale: No hurt  Home Living                                          Prior Functioning/Environment              Frequency  Min 2X/week        Progress Toward Goals  OT Goals(current goals can now be found in the care plan section)  Progress towards OT goals: Progressing toward goals  Acute Rehab OT Goals Patient Stated Goal: none stated OT Goal Formulation: Patient unable to participate in goal setting Time For Goal Achievement: 03/28/20 Potential to Achieve Goals: Good ADL Goals Pt Will Perform Grooming: with min assist;sitting;bed level Pt Will Perform Upper Body Bathing: with mod assist;sitting;bed level Pt/caregiver will Perform Home Exercise Program: Increased strength;Increased ROM;Left upper extremity;With minimal assist;With written HEP provided Additional ADL Goal #1: pt will progress to EOB with max (A) as precursor to adls Additional ADL Goal #2: Pt will attend to L visual field/L side of body during functional task with no more than min cues.  Plan Discharge plan remains appropriate;Frequency remains appropriate    Co-evaluation    PT/OT/SLP Co-Evaluation/Treatment: Yes Reason for Co-Treatment: Complexity of the patient's impairments (multi-system involvement);For patient/therapist safety;To address functional/ADL transfers;Necessary to address cognition/behavior during functional activity PT goals addressed during session: Mobility/safety with mobility;Balance;Strengthening/ROM OT goals addressed during session: ADL's and self-care;Proper use of Adaptive  equipment and DME;Strengthening/ROM      AM-PAC OT "6 Clicks" Daily Activity     Outcome Measure   Help from another person eating meals?: Total Help from another person taking care of personal grooming?: A Lot Help from another person toileting, which includes using toliet, bedpan, or urinal?: Total Help from another person bathing (including washing, rinsing, drying)?: Total Help from another person to put on and taking off regular upper body clothing?: Total Help from another person to put on and taking off regular lower body clothing?: Total 6 Click Score: 7    End of Session Equipment Utilized During Treatment: Gait belt  OT Visit Diagnosis: Other abnormalities of gait and mobility (R26.89);Muscle weakness (generalized) (M62.81);Hemiplegia and hemiparesis Hemiplegia - Right/Left: Left Hemiplegia - dominant/non-dominant: Non-Dominant   Activity Tolerance Patient tolerated treatment well   Patient Left in chair;with call bell/phone within reach;with chair alarm set;Other (comment) (lift pad placed and recommendation for lift back to bed)   Nurse Communication Mobility status;Precautions        Time: 5277-8242 OT Time Calculation (min): 22 min  Charges: OT General Charges $OT Visit: 1 Visit OT Treatments $Self Care/Home Management : 8-22 mins   Brynn, OTR/L  Acute Rehabilitation Services Pager: [phone] Office: [phone] .    Mateo Flow 03/22/2020, 1:28 PM

## 2020-03-22 NOTE — Progress Notes (Signed)
STROKE TEAM PROGRESS NOTE   INTERVAL HISTORY  Patient is lying in bed. Sleepy today but can be aroused.  Has severe dysarthria. Follows comannds on the right.  Vital signs stable.   OBJECTIVE Vitals:   03/21/20 2021 03/21/20 2352 03/22/20 0457 03/22/20 0500  BP: 122/69 140/83 124/72   Pulse: 88 94 85   Resp: 16 20 16    Temp: 98.2 F (36.8 C) 98 F (36.7 C) 98.7 F (37.1 C)   TempSrc: Oral Oral Oral   SpO2: 96% 99% 98%   Weight:    81.2 kg  Height:       CBC:  Recent Labs  Lab 03/21/20 0152 03/22/20 0237  WBC 9.1 9.0  NEUTROABS 6.7 6.6  HGB 12.2* 11.7*  HCT 36.0* 34.9*  MCV 91.8 92.1  PLT 194 184   Basic Metabolic Panel:  Recent Labs  Lab 03/15/20 1013 03/16/20 0030 03/21/20 0152 03/22/20 0237  NA  --    < > 136 135  K  --    < > 4.6 4.5  CL  --    < > 101 100  CO2  --    < > 26 25  GLUCOSE  --    < > 160* 156*  BUN  --    < > 30* 38*  CREATININE  --    < > 0.90 0.90  CALCIUM  --    < > 8.7* 8.5*  MG 2.0  --  2.0 2.0  PHOS 3.6  --   --   --    < > = values in this interval not displayed.   Lipid Panel:     Component Value Date/Time   CHOL 123 03/12/2020 0054   TRIG 102 03/14/2020 0609   HDL 34 (L) 03/12/2020 0054   CHOLHDL 3.6 03/12/2020 0054   VLDL 18 03/12/2020 0054   LDLCALC 71 03/12/2020 0054   HgbA1c:  Lab Results  Component Value Date   HGBA1C 5.8 (H) 03/12/2020   Urine Drug Screen:     Component Value Date/Time   LABOPIA NEGATIVE 03/13/2012 1900   COCAINSCRNUR NEGATIVE 03/13/2012 1900   LABBENZ NEGATIVE 03/13/2012 1900   AMPHETMU NEGATIVE 03/13/2012 1900   THCU NEGATIVE 03/13/2012 1900   LABBARB NEGATIVE 03/13/2012 1900    Alcohol Level No results found for: ETH  IMAGING  CT HEAD CODE STROKE WO CONTRAST 03/14/2020 1. No acute intracranial process. Mild cerebral atrophy and chronic microvascular ischemic changes.  2. ASPECTS is 10  3. Remote lacunar insult versus dilated perivascular spaces involving the right basal ganglia,  posterior limb of the right internal capsule and left insula.   CT Code Stroke CTA Head W/WO contrast CT Code Stroke CTA Neck W/WO contrast 03/07/2020   Right M1 segment large vessel occlusion. No large vessel occlusion within the neck. 50% right and 30% left proximal ICA luminal narrowing. Mild-to-moderate left vertebral artery origin narrowing. Mild narrowing of the bilateral V4 segments, right P3 and left P2 segments.   MR BRAIN WO CONTRAST MR ANGIO HEAD WO CONTRAST 03/12/2020 Sequela of acute/subacute right MCA territory infarct. Small acute right cerebellar insult. Interval revascularization of the right MCA. No high-grade intracranial narrowing or large vessel occlusion. Mild narrowing of the right V4, right P2 and left M2 segments.   DG Chest Port 1 View 03/15/2020 1.  Interval endotracheal extubation. 2. There is improved aeration of the lungs without acute airspace opacity. 3.  Cardiomegaly. 03/12/2020 Increased bibasilar opacities. Slight interval retraction of ETT.  03/22/20 1. Endotracheal tube distal tip in the midthoracic trachea.  2. Bibasilar subsegmental atelectasis.  DG Chest Port 1 View 03/18/2020 Impression: 1. Stable mild cardiomegaly without pulmonary edema.  2. Stable minimal bibasilar atelectasis.  DG Abd Portable 1V 03/13/2020 Orogastric tube tip and side port in distal stomach. No bowel obstruction or free air evident. Apparent staghorn calculi in left kidney, stable. Small pleural effusions bilaterally with bibasilar atelectasis.  Mar 22, 2020 Enteric tube with tip in the distal esophagus, side-port in the mid esophagus. Recommend advancement of greater than 10 cm for optimal placement.   ECHOCARDIOGRAM COMPLETE 03-22-2020 1. Left ventricular ejection fraction, by estimation, is 50 to 55%. The left ventricle has low normal function. The left ventricle has no regional wall motion abnormalities. There is severe concentric left ventricular hypertrophy. Left  ventricular diastolic parameters are indeterminate.   2. Right ventricular systolic function is normal. The right ventricular size is normal.   3. Left atrial size was severely dilated.   4. Right atrial size was severely dilated.   5. A small pericardial effusion is present. The pericardial effusion is surrounding the apex.   6. The mitral valve is grossly normal. Trivial mitral valve regurgitation.   7. The aortic valve is tricuspid. There is mild calcification of the aortic valve. Aortic valve regurgitation is mild. No aortic stenosis is present.   8. The inferior vena cava is normal in size with greater than 50% respiratory variability, suggesting right atrial pressure of 3 mmHg. Comparison(s): No prior Echocardiogram.   ECG - atrial fibrillation - ventricular response 81 BPM (See cardiology reading for complete details)   PHYSICAL EXAM   Temp:  [98 F (36.7 C)-98.9 F (37.2 C)] 98.7 F (37.1 C) (12/22 0457) Pulse Rate:  [78-94] 85 (12/22 0457) Resp:  [16-20] 16 (12/22 0457) BP: (102-140)/(69-83) 124/72 (12/22 0457) SpO2:  [96 %-99 %] 98 % (12/22 0457) Weight:  [81.2 kg] 81.2 kg (12/22 0500)  General -elderly male not in distress.  Ophthalmologic - fundi not visualized due to noncooperation.  Cardiovascular - irregularly irregular heart rate and rhythm.  Neuro -sleepy but can be aroused.  Able to follow all simple midline and right hand and foot commands. However, nonverbal with severe anarthria and makes a few guttural sounds. With eye opening, eyes in right gaze preference position, however able to cross midline, although left gaze incomplete. Not blinking to visual threat on the left, blinking on the right, pupils equal bilaterally reactive to light. Left facial droop, tongue midline in mouth but protrusion not cooperative. RUE spontaneous purposeful movement able to against gravity, able to follow commands on the right hand. RLE proximal 2/5 and knee flexion 3+/5 on pain, LUE  flaccid, LLE mild withdraw movement of the knee. DTR 1+ and no babinski. Sensation, coordination not cooperative and gait not tested.   ASSESSMENT/PLAN Jeffrey Ashley is a 86 y.o. male with history of hypertension, hypothyroidism, hearing impaired, subdural hematoma in 2013 and atrial fibrillation not on anticoagulation who presents with dysarthria and left-sided weakness. The pt vomited en route and there was concern for possible aspiration.  The patient received IV t-PA Saturday 22-Mar-2020 at 9:45 AM. IR 03-22-2020 at 11:40 AM - S/P complete revascularization of RT MCA M1 occlusion achieving a TICI 3 revascularization.  Stroke: Rt MCA and small R cerebellar infarcts - embolic - likely from atrial fibrillation without anticoagulation prior to admission  CT Head -  No acute intracranial process. Remote lacunar insult versus dilated perivascular spaces involving the  right basal ganglia, posterior limb of the right internal capsule and left insula.   CTA H&N - Right M1 segment large vessel occlusion. No large vessel occlusion within the neck. 50% right and 30% left proximal ICA luminal narrowing. Mild-to-moderate left vertebral artery origin narrowing. Mild narrowing of the bilateral V4 segments, right P3 and left P2 segments.  MRI / MRA head - R large extensive MCA infarct. Small R cerebellar infarct. Interval revascularization R MCA. Mild narrowing R V4, R P2, L M2  2D Echo - EF 50 - 55%. No cardiac source of emboli identified.   [employer] Corona Virus 2 - negative  LDL - 71  HgbA1c - 5.8  VTE prophylaxis - Heparin 5000 units sq tid   aspirin 81 mg daily prior to admission, now on aspirin 325 mg daily   Therapy recommendations: SNF  Disposition: - if respiratory / swallow condition does not improve over the weekend, may consider palliative care consult - still NPO. Speech continues to work with pt. Dr. Allena KatzPatel to reach out to patient's son regarding status including possibly palliative care,  discussed with Dr. Allena KatzPatel today, appreciate our colleague following along with us.   Chronic Afib  Chronic afib but not sure about afib burden   Was on coumadin in the past, off after SDH  Never restarted due to advanced age, spontaneous SDH, gross hematuria  [facility]  Currently rate controlled  Now on ASA. Dr. Frederick PeersGirguis to reach out to patient's son regarding status including AC in the future, PEG tube if swallowing doesn't improve or palliative care. discussed with Dr. Frederick PeersGirguis today, appreciate our colleague following along with us.     Acute Hypoxic Respiratory failure  Intubated on vent ->now extubated  Off sedation  CCM on board  Tolerating post extubation, however still has difficulty with oral secretion  Chest PT  3% saline nebulization  NT suctioning as needed  CXR 12/18 - Stable mild cardiomegaly without pulmonary edema. Stable minimal bibasilar atelectasis  Aspiration pneumonia  Vomited x2 before intubation  T Max - 100.4->afebrile->100.4->99.7  IV Unasyn started 03/20/2020-> off now  [facility] 6.0->9.1->10.3->8.1->8.3->9.7->8.6  Respiratory cultures - normal respiratory flora.  CXR 12/18 - Stable mild cardiomegaly without pulmonary edema. Stable minimal bibasilar atelectasis.  Hypertension  Home BP meds: Coreg  Current BP meds: Resumed Coreg   Stable . BP goal post tPA < 180/105, gradually normalize in 5-7 days  . Long-term BP goal normotensive  Hyperlipidemia  Home Lipid lowering medication: none   LDL 71, goal < 70  Put on lipitor 20  Continue statin on discharge  Dysphagia  N.p.o.  s/p cortrak 12/15  On TF and FW - Osmolite / ProSource  Speech to follow  Pt remains NPO per speech therapy.  Other Stroke Risk Factors  Advanced age  Previous ETOH use  Hx of R SDH 2013 s/p craniotomy  Possible remote lacunar by imaging  Other Active Problems, Findings and Recommendations  Code status -  Limited code CKD - stage 3a - creatinine - 1.26->1.21->1.25->1.01->0.88->0.90->0.85->0.82 - normalized and stable. On Vit D Hypothyroid on synthroid On Xalatan    Hematuria - catheter related trauma - monitoring  Hospital day # 11 Discussed with Dr. Frederick PeersGirguis  Given patient's advanced age and severe dysarthria and dysphagia unclear if patient will improve significantly and with good quality of life.  He may need PEG tube and rehabilitation in a nursing home situation.  Need to have goals of care discussion with  patient's son and make decisions. Stroke team will sign off.  Kindly call for questions Delia Heady, MD  To contact Stroke Continuity provider, please refer to [email or web address]. After hours, contact General Neurology

## 2020-03-22 NOTE — TOC Progression Note (Signed)
Transition of Care [facility]) - Progression Note    Patient Details  Name: Jeffrey Ashley MRN: [id number] Date of Birth: [birth year]  Transition [facility] Care [facility] CM/SW Contact  Levada Schilling Phone Number: 03/22/2020, 10:32 AM  Clinical Narrative:    CSW spoke with admission director Sue Lush with [facility].  Facility can receive pt with hospice.  If pt discharges with a feeding tube SNF will need 24 hours or longer notice in order to get the food pt will need. If discharging on Christmas Eve or Christmas day will need orders in by 12:00pm Thursday, Dec 23rd.Marland Kitchen Physician has been updated on information for discharging. TOC Team will continue to assist with disposition planning.   Expected Discharge Plan: Skilled Nursing Facility Barriers to Discharge: Continued Medical Work up  Expected Discharge Plan and Services Expected Discharge Plan: Skilled Nursing Facility In-house Referral: Clinical Social Work Discharge Planning Services: CM Consult Post Acute Care Choice: Skilled Nursing Facility Living arrangements for the past 2 months: Assisted Living Facility                                       Social Determinants of Health (SDOH) Interventions    Readmission Risk Interventions No flowsheet data found.

## 2020-03-22 NOTE — Plan of Care (Signed)
Not progressing due to altered mental status and disorientation x3.   Problem: Education: Goal: Knowledge of disease or condition will improve Outcome: Not Progressing Goal: Knowledge of secondary prevention will improve Outcome: Not Progressing Goal: Knowledge of patient specific risk factors addressed and post discharge goals established will improve Outcome: Not Progressing Goal: Individualized Educational Video(s) Outcome: Not Progressing   Problem: Coping: Goal: Will verbalize positive feelings about self Outcome: Not Progressing Goal: Will identify appropriate support needs Outcome: Not Progressing   Problem: Health Behavior/Discharge Planning: Goal: Ability to manage health-related needs will improve Outcome: Not Progressing   Problem: Self-Care: Goal: Ability to participate in self-care as condition permits will improve Outcome: Not Progressing Goal: Verbalization of feelings and concerns over difficulty with self-care will improve Outcome: Not Progressing Goal: Ability to communicate needs accurately will improve Outcome: Not Progressing   Problem: Nutrition: Goal: Risk of aspiration will decrease Outcome: Not Progressing Goal: Dietary intake will improve Outcome: Not Progressing   Problem: Ischemic Stroke/TIA Tissue Perfusion: Goal: Complications of ischemic stroke/TIA will be minimized Outcome: Not Progressing   Problem: Safety: Goal: Non-violent Restraint(s) Outcome: Not Progressing

## 2020-03-22 NOTE — Progress Notes (Signed)
  Speech Language Pathology Treatment: Dysphagia  Patient Details Name: Jeffrey Ashley MRN: [id number] DOB: 21-Nov-1926 Today's Date: 03/22/2020 Time: 5732-2025 SLP Time Calculation (min) (ACUTE ONLY): 15 min  Assessment / Plan / Recommendation Clinical Impression  Pt was seen at the end of PT/OT session with pt very alert and upright in his chair to maximize arousal and safe positioning. He is following commands more so on his R side. Pt has a little bit of mouth opening when cued in order to place boluses into his mouth, but he doesn't actively seal his lips around a cup or spoon, and does not suck through a straw. Thin liquids and ice fall back out of his mouth, secondary to reduced labial seal as well as positioning and what seems to be reduced lingual control. He does not make any attempts to swallow despite Max cues, resulting in use of oral suction to remove purees. Recommend that he remain NPO.    HPI HPI: Jeffrey Ashley is 86 yo M w/ dementia presenting w/ acute left sided weakness on code stroke. Received IV TPA and right MCA M1 re-vascularized by neuro ir. Remained intubated due to suspected aspiration. MRI on 12/12 post-intervention showing large right MCA infarct. CXR 12/15: "There is improved aeration of the lungs without acute airspace opacity."      SLP Plan  Continue with current plan of care       Recommendations  Diet recommendations: NPO Medication Administration: Via alternative means                Oral Care Recommendations: Oral care QID Follow up Recommendations: Skilled Nursing facility;24 hour supervision/assistance SLP Visit Diagnosis: Dysphagia, unspecified (R13.10) Plan: Continue with current plan of care       GO                Mahala Menghini., M.A. CCC-SLP Acute Rehabilitation Services Pager [phone] Office [phone]  03/22/2020, 12:30 PM

## 2020-03-22 NOTE — Progress Notes (Signed)
PROGRESS NOTE    Jeffrey Ashley   YIA:[id number]  DOB: 11-18-1926  DOA: 03/14/2020     11  PCP: Venia Carbon, MD  CC: weakness  Hospital Course: Jeffrey Ashley is a 86 yo male with PMH hypothyroidism, hypertension, macular degeneration, chronic A. fib, dementia, BPH who presented to the hospital with left-sided weakness.  Work-up was ultimately notable for a large right M1 segment LVO.  He underwent administration of TPA and thrombectomy on 03/30/2020. Clinically he had minimal improvement and required initiation of tube feeds via coretrak.  He was followed by neurology during hospitalization who also agreed that patient's prognosis was poor with low chance of meaningful recovery. After several days of ongoing monitoring, patient continued to have significant secretions with concern for aspiration due to inability to clear secretions. On 03/21/2020, status was discussed with patient's son, Jeffrey Ashley.  Clinical course and prognosis was discussed.  Jeffrey Ashley was in agreement for pursuing palliative care consult for further goals of care discussions in anticipation of pursuing hospice/comfort care.   Interval History:  No events overnight. Spoke with [facility] team today; further conversations with family to come. Patient was sitting in chair this am; more awake but still not doing much purposeful; mittens are off still and he seems to be more comfortable that way. Not as much secretions and drooling noted today.   Old records reviewed in assessment of this patient  ROS: Review of systems not obtained due to patient factors.  Cognitive impairment  Assessment & Plan: * Middle cerebral artery embolism, right - s/p TPA and thrombectomy on 12/11 - per CTA head: "Right M1 segment large vessel occlusion." Patient has not improved since admission with aggressive measures - discussed at length with Jeffrey Ashley on phone on 12/21 [facility] consulted; tentative plan is to pursue comfort/hospice but family would like  formal palliative care consult at this point - continuing meds and TF (if able to tolerate) until [place] discussions take place - keep DNR/DNI  Dysphagia - remains severe; still failed SLP eval again on 12/20 - keep NPO - still do not recommend PEG as this will prolong poor quality of life and increase risk of developing further complications and does not decrease aspiration risk - slight improvement in secretions today but still unsafe for anything PO - hold TF for now if concern for aspiration; will resume if/when able  Protein-calorie malnutrition, severe - TF on hold due to concern for aspiration at this time; will resume as able   Hypothyroidism -Continue Synthroid  Essential hypertension - BP improved now - continue amlodipine and coreg  Atrial fibrillation, chronic (HCC) -Anticoagulation with Coumadin discontinued due to subdural hematoma -Remains on aspirin only   DVT prophylaxis: HSQ Code Status: DNR Family Communication:  Disposition Plan: Status is: Inpatient  Remains inpatient appropriate because:Altered mental status and Inpatient level of care appropriate due to severity of illness   Dispo:  Patient From: Home  Planned Disposition: Possibly hospice  Expected discharge date: 03/23/2020  Medically stable for discharge: No  Objective: Blood pressure (!) 122/92, pulse 85, temperature 97.7 F (36.5 C), temperature source Oral, resp. rate 19, height 6' (1.829 m), weight 81.2 kg, SpO2 97 %.  Examination: General appearance: Chronically ill-appearing elderly gentleman sitting up in chair in no obvious distress appearing slightly more comfortable today Head: Normocephalic, without obvious abnormality, atraumatic Eyes: EOMI Lungs: coarse sounds bilaterally Heart: regular rate and rhythm and S1, S2 normal Abdomen: soft, ND, BS present Extremities: no edema Skin: warm, dry,  intact Neurologic: only moving RUE with purpose; does not follow commands; remains  nonverbal  Consultants:   Neurology  Palliative care medicine  Procedures:     Data Reviewed: I have personally reviewed following labs and imaging studies Results for orders placed or performed during the hospital encounter of 03/25/2020 (from the past 24 hour(s))  Glucose, capillary     Status: Abnormal   Collection Time: 03/21/20  4:26 PM  Result Value Ref Range   Glucose-Capillary 148 (H) 70 - 99 mg/dL   Comment 1 Notify RN    Comment 2 Document in Chart   Glucose, capillary     Status: Abnormal   Collection Time: 03/21/20  9:16 PM  Result Value Ref Range   Glucose-Capillary 138 (H) 70 - 99 mg/dL  Glucose, capillary     Status: Abnormal   Collection Time: 03/22/20 12:21 AM  Result Value Ref Range   Glucose-Capillary 145 (H) 70 - 99 mg/dL   Comment 1 Notify RN    Comment 2 Document in Chart   Basic metabolic panel     Status: Abnormal   Collection Time: 03/22/20  2:37 AM  Result Value Ref Range   Sodium 135 135 - 145 mmol/L   Potassium 4.5 3.5 - 5.1 mmol/L   Chloride 100 98 - 111 mmol/L   CO2 25 22 - 32 mmol/L   Glucose, Bld 156 (H) 70 - 99 mg/dL   BUN 38 (H) 8 - 23 mg/dL   Creatinine, Ser 0.90 0.61 - 1.24 mg/dL   Calcium 8.5 (L) 8.9 - 10.3 mg/dL   GFR, Estimated >60 >60 mL/min   Anion gap 10 5 - 15  CBC with Differential/Platelet     Status: Abnormal   Collection Time: 03/22/20  2:37 AM  Result Value Ref Range   WBC 9.0 4.0 - 10.5 K/uL   RBC 3.79 (L) 4.22 - 5.81 MIL/uL   Hemoglobin 11.7 (L) 13.0 - 17.0 g/dL   HCT 34.9 (L) 39.0 - 52.0 %   MCV 92.1 80.0 - 100.0 fL   MCH 30.9 26.0 - 34.0 pg   MCHC 33.5 30.0 - 36.0 g/dL   RDW 14.2 11.5 - 15.5 %   Platelets 184 150 - 400 K/uL   nRBC 0.0 0.0 - 0.2 %   Neutrophils Relative % 74 %   Neutro Abs 6.6 1.7 - 7.7 K/uL   Lymphocytes Relative 11 %   Lymphs Abs 1.0 0.7 - 4.0 K/uL   Monocytes Relative 10 %   Monocytes Absolute 0.9 0.1 - 1.0 K/uL   Eosinophils Relative 2 %   Eosinophils Absolute 0.2 0.0 - 0.5 K/uL    Basophils Relative 1 %   Basophils Absolute 0.1 0.0 - 0.1 K/uL   Immature Granulocytes 2 %   Abs Immature Granulocytes 0.18 (H) 0.00 - 0.07 K/uL  Magnesium     Status: None   Collection Time: 03/22/20  2:37 AM  Result Value Ref Range   Magnesium 2.0 1.7 - 2.4 mg/dL  Glucose, capillary     Status: Abnormal   Collection Time: 03/22/20  3:53 AM  Result Value Ref Range   Glucose-Capillary 145 (H) 70 - 99 mg/dL   Comment 1 Notify RN    Comment 2 Document in Chart   Glucose, capillary     Status: Abnormal   Collection Time: 03/22/20  7:30 AM  Result Value Ref Range   Glucose-Capillary 160 (H) 70 - 99 mg/dL   Comment 1 Notify RN  Comment 2 Document in Chart   Glucose, capillary     Status: Abnormal   Collection Time: 03/22/20 12:18 PM  Result Value Ref Range   Glucose-Capillary 138 (H) 70 - 99 mg/dL   Comment 1 Notify RN    Comment 2 Document in Chart     No results found for this or any previous visit (from the past 240 hour(s)).   Radiology Studies: No results found. DG CHEST PORT 1 VIEW  Final Result    DG CHEST PORT 1 VIEW  Final Result    DG Abd Portable 1V  Final Result    MR BRAIN WO CONTRAST  Final Result    MR ANGIO HEAD WO CONTRAST  Final Result    DG Chest Port 1 View  Final Result    DG Abd Portable 1V  Final Result    DG Chest Port 1 View  Final Result    IR PERCUTANEOUS ART THROMBECTOMY/INFUSION INTRACRANIAL INC DIAG ANGIO  Final Result    IR CT Head Ltd  Final Result    CT Code Stroke CTA Head W/WO contrast  Final Result  Addendum 1 of 1  ADDENDUM REPORT: 03/10/2020 10:06    ADDENDUM:  These results were called by telephone at the time of interpretation  on 03/23/2020 at 10:00 am to provider MCNEILL [facility] , who  verbally acknowledged these results.      Electronically Signed    By: Primitivo Gauze M.D.    On: 03/31/2020 10:06      Final    CT Code Stroke CTA Neck W/WO contrast  Final Result  Addendum 1 of 1   ADDENDUM REPORT: 03/30/2020 10:06    ADDENDUM:  These results were called by telephone at the time of interpretation  on 03/03/2020 at 10:00 am to provider MCNEILL [facility] , who  verbally acknowledged these results.      Electronically Signed    By: Primitivo Gauze M.D.    On: 03/02/2020 10:06      Final    CT HEAD CODE STROKE WO CONTRAST  Final Result      Scheduled Meds: . amLODipine  10 mg Per Tube Daily  . aspirin  325 mg Per Tube Daily  . atorvastatin  20 mg Per Tube Daily  . carvedilol  25 mg Per Tube BID  . chlorhexidine gluconate (MEDLINE KIT)  15 mL Mouth Rinse BID  . Chlorhexidine Gluconate Cloth  6 each Topical Daily  . cholecalciferol  5,000 Units Per Tube Daily  . feeding supplement (PROSource TF)  45 mL Per Tube TID  . free water  100 mL Per Tube Q6H  . heparin injection (subcutaneous)  5,000 Units Subcutaneous Q8H  . latanoprost  1 drop Both Eyes QHS  . levothyroxine  50 mcg Per Tube QAC breakfast  . mouth rinse  15 mL Mouth Rinse 6 times per day  . pantoprazole sodium  40 mg Per Tube Daily   PRN Meds: [DISCONTINUED] acetaminophen **OR** acetaminophen (TYLENOL) oral liquid 160 mg/5 mL **OR** acetaminophen, glycopyrrolate Continuous Infusions: . sodium chloride    . feeding supplement (OSMOLITE 1.5 CAL) 1,000 mL (03/22/20 0508)     LOS: 11 days  Time spent: Greater than 50% of the 35 minute visit was spent in counseling/coordination of care for the patient as laid out in the A&P.   Dwyane Dee, MD [facility] 03/22/2020, 1:28 PM

## 2020-03-22 NOTE — Progress Notes (Addendum)
Nutrition Follow-up  DOCUMENTATION CODES:   Severe malnutrition in context of chronic illness  INTERVENTION:  Await decision regarding GOC. If pt is to continue to receive full scope of care, recommend resuming TF as follows (if able):  - Osmolite 1.5 @ 60 ml/hr (1440 ml/day) - ProSource TF 45 ml TID - Free water flushes per MD, currently Q6H  Tube feeding regimen provides 2280 kcal, 123 grams of protein, and 1097 ml of H2O (Total free water with flushes: )  If decision is made to pursue comfort care, Cortrak should be removed and food should be used as a source of comfort.   NUTRITION DIAGNOSIS:   Severe Malnutrition related to chronic illness as evidenced by severe fat depletion,severe muscle depletion.  ongoing  GOAL:   Patient will meet greater than or equal to 90% of their needs  Was being addressed with TF; TF currently being held  MONITOR:   Vent status,TF tolerance,I & O's  REASON FOR ASSESSMENT:   Consult,Ventilator Enteral/tube feeding initiation and management  ASSESSMENT:   Pt with PMH of HTN, hypothyroidism, and is HOH admitted with large R MCA stroke s/p tPA and thrombectomy.   12/14 - extubated 12/15 - Cortrak placed, tip gastric  Per MD, pt's prognosis is poor and pt has shown minimal improvement in clinical status. MD notes that pt is actively approaching end-of-life and is recommending a hospice/comfort care approach. Palliative Care has been consulted.   Pt has not yet passed a swallow evaluation and continues to have a Cortrak in place, but TF was placed on hold yesterday due to concern for aspiration. Plan is to resume TF if/when able, pending the results of the upcoming GOC discussion. If decision is made to pursue comfort care, Cortrak should be removed and food should be used as a source of comfort.   Current TF regimen via Cortrak: Osmolite 1.5 @ 60 ml/hr (1440 ml/day), 7ml ProSource TF TID, free water flushes per MD (currently  100 ml Q6H). TF regimen provides 2280 kcal, 123 grams of protein, and 1097 ml of H2O (total free water with flushes: )  UOP: documented x24 hours  Labs reviewed.  Medications: vitamin D3  Diet Order:   Diet Order            Diet NPO time specified  Diet effective now                 EDUCATION NEEDS:   No education needs have been identified at this time  Skin:  Skin Assessment: Reviewed RN Assessment  Last BM:  12/22 type 5 via rectal tube  Height:   Ht Readings from Last 1 Encounters:  03/08/2020 6' (1.829 m)    Weight:   Wt Readings from Last 1 Encounters:  03/22/20 81.2 kg    Ideal Body Weight:  80.9 kg  BMI:  Body mass index is 24.28 kg/m.  Estimated Nutritional Needs:   Kcal:  2100-2300  Protein:  115-135 grams  Fluid:  >/= 2.0 L    Eugene Gavia, MS, RD, LDN RD pager number and weekend/on-call pager number located in Amion.

## 2020-03-23 LAB — BASIC METABOLIC PANEL
Anion gap: 12 (ref 5–15)
BUN: 35 mg/dL — ABNORMAL HIGH (ref 8–23)
CO2: 27 mmol/L (ref 22–32)
Calcium: 8.8 mg/dL — ABNORMAL LOW (ref 8.9–10.3)
Chloride: 98 mmol/L (ref 98–111)
Creatinine, Ser: 0.94 mg/dL (ref 0.61–1.24)
GFR, Estimated: >60 mL/min (ref >60)
Glucose, Bld: 159 mg/dL — ABNORMAL HIGH (ref 70–99)
Potassium: 4.3 mmol/L (ref 3.5–5.1)
Sodium: 137 mmol/L (ref 135–145)

## 2020-03-23 LAB — CBC WITH DIFFERENTIAL/PLATELET
Abs Immature Granulocytes: 0.25 10*3/uL — ABNORMAL HIGH (ref 0.00–0.07)
Basophils Absolute: 0.1 10*3/uL (ref 0.0–0.1)
Basophils Relative: 1 %
Eosinophils Absolute: 0.3 10*3/uL (ref 0.0–0.5)
Eosinophils Relative: 3 %
HCT: 36.7 % — ABNORMAL LOW (ref 39.0–52.0)
Hemoglobin: 11.6 g/dL — ABNORMAL LOW (ref 13.0–17.0)
Immature Granulocytes: 3 %
Lymphocytes Relative: 14 %
Lymphs Abs: 1.3 10*3/uL (ref 0.7–4.0)
MCH: 29.8 pg (ref 26.0–34.0)
MCHC: 31.6 g/dL (ref 30.0–36.0)
MCV: 94.3 fL (ref 80.0–100.0)
Monocytes Absolute: 1 10*3/uL (ref 0.1–1.0)
Monocytes Relative: 11 %
Neutro Abs: 6.5 10*3/uL (ref 1.7–7.7)
Neutrophils Relative %: 68 %
Platelets: 246 10*3/uL (ref 150–400)
RBC: 3.89 MIL/uL — ABNORMAL LOW (ref 4.22–5.81)
RDW: 14.2 % (ref 11.5–15.5)
WBC: 9.5 10*3/uL (ref 4.0–10.5)
nRBC: 0 % (ref 0.0–0.2)

## 2020-03-23 LAB — GLUCOSE, CAPILLARY
Glucose-Capillary: 113 mg/dL — ABNORMAL HIGH (ref 70–99)
Glucose-Capillary: 119 mg/dL — ABNORMAL HIGH (ref 70–99)
Glucose-Capillary: 129 mg/dL — ABNORMAL HIGH (ref 70–99)
Glucose-Capillary: 134 mg/dL — ABNORMAL HIGH (ref 70–99)
Glucose-Capillary: 146 mg/dL — ABNORMAL HIGH (ref 70–99)
Glucose-Capillary: 155 mg/dL — ABNORMAL HIGH (ref 70–99)

## 2020-03-23 LAB — MAGNESIUM: Magnesium: 2 mg/dL (ref 1.7–2.4)

## 2020-03-23 MED ORDER — HYDROMORPHONE HCL 1 MG/ML IJ SOLN
0.5000 mg | INTRAMUSCULAR | Status: DC | PRN
  Administered 2020-03-26: 1 mg via INTRAVENOUS
  Filled 2020-03-23: qty 1

## 2020-03-23 MED ORDER — METOPROLOL TARTRATE 5 MG/5ML IV SOLN
2.5000 mg | Freq: Three times a day (TID) | INTRAVENOUS | Status: DC
  Administered 2020-03-23 – 2020-03-25 (×8): 2.5 mg via INTRAVENOUS
  Filled 2020-03-23 (×8): qty 5

## 2020-03-23 MED ORDER — GLYCOPYRROLATE 0.2 MG/ML IJ SOLN
0.2000 mg | INTRAMUSCULAR | Status: DC
  Administered 2020-03-23 – 2020-03-26 (×13): 0.2 mg via INTRAVENOUS
  Filled 2020-03-23 (×13): qty 1

## 2020-03-23 MED ORDER — DEXTROSE-NACL 5-0.9 % IV SOLN: INTRAVENOUS | Status: DC

## 2020-03-23 MED ORDER — LORAZEPAM 2 MG/ML IJ SOLN: 1.0000 mg | INTRAMUSCULAR | Status: DC | PRN

## 2020-03-23 NOTE — Progress Notes (Addendum)
Palliative Care Family Meeting  I met with 3 of the patient's son's today via video visit to discuss his goals of care. They all agree that their dad would not want his life prolonged in this situation- he was clear on his wishes prior to his stoke. We discussed the decision to transition to comfort care and I outlined the anticipated trajectory following this transition.  Summary of Plan:  1. Transition to Comfort Care 2. All of his sons will be present on 12/26 because they would like an opportunity to attempt to communicate this to their dad and to also provide comfort to him. They are certain this would be his choice and they are at peace with their decision. 3. He will need a Hospice IPU- they are requesting specifically [facility]. 4. We discussed the feeding tube and whether or not to leave the tube in place until they could arrive but decided to go ahead an remove the tube for his comfort-ok to run some D5 until family arrives on Sunday. 5. Should provide frequent oral care and suctioning as needed. 6. Frequent monitoring for signs of distress, pain or agitation. 7. Will order comfort medications and those should be used PRN to maintain his comfort at all times. 8. Given the oral and sublingual route will not be available or safe will order a midline to be placed so that if needed we can administer comfort medication with stable IV access. I suspect he will eventually need continuous sedation.  Lane Hacker, DO Palliative Medicine  Time: 35 min Greater than 50%  of this time was spent counseling and coordinating care related to the above assessment and plan.

## 2020-03-23 NOTE — Progress Notes (Signed)
This nurse assessed patient. At this time patient to agitated for placing midline. Discussed POC with nurse. VU Tomasita Morrow, RN VAST

## 2020-03-23 NOTE — Progress Notes (Signed)
PROGRESS NOTE    Jeffrey Ashley   WUJ:[id number]  DOB: 08-16-1926  DOA: 03/01/2020     12  PCP: Venia Carbon, MD  CC: weakness  Hospital Course: [name] is a 86 yo male with PMH hypothyroidism, hypertension, macular degeneration, chronic A. fib, dementia, BPH who presented to the hospital with left-sided weakness.  Work-up was ultimately notable for a large right M1 segment LVO.  He underwent administration of TPA and thrombectomy on 03/20/2020. Clinically he had minimal improvement and required initiation of tube feeds via coretrak.  He was followed by neurology during hospitalization who also agreed that patient's prognosis was poor with low chance of meaningful recovery. After several days of ongoing monitoring, patient continued to have significant secretions with concern for aspiration due to inability to clear secretions. On 03/21/2020, status was discussed with patient's son, [name].  Clinical course and prognosis was discussed.  [name] was in agreement for pursuing palliative care consult for further goals of care discussions in anticipation of pursuing hospice/comfort care.   Interval History:  No events overnight. Laying in bed. Mitten back on right hand this morning and he was shaking it again wanting it off.   Old records reviewed in assessment of this patient  ROS: Review of systems not obtained due to patient factors.  Cognitive impairment  Assessment & Plan: * Middle cerebral artery embolism, right - s/p TPA and thrombectomy on 12/11 - per CTA head: "Right M1 segment large vessel occlusion." Patient has not improved since admission with aggressive measures - discussed at length with [name] on phone on 12/21 [facility] consulted; tentative plan is to pursue comfort/hospice but family would like formal palliative care consult at this point - continuing meds and TF (if able to tolerate) until [place] discussions take place - keep DNR/DNI  Dysphagia - remains severe;  still failed SLP eval again on 12/20 - keep NPO - still do not recommend PEG as this will prolong poor quality of life and increase risk of developing further complications and does not decrease aspiration risk - slight improvement in secretions today but still unsafe for anything PO - hold TF for now if concern for aspiration; will resume if/when able  Protein-calorie malnutrition, severe - TF on hold due to concern for aspiration at this time; will resume as able   Hypothyroidism -Continue Synthroid  Essential hypertension - BP improved now - continue amlodipine and coreg  Atrial fibrillation, chronic (HCC) -Anticoagulation with Coumadin discontinued due to subdural hematoma -Remains on aspirin only   DVT prophylaxis: HSQ Code Status: DNR Family Communication:  Disposition Plan: Status is: Inpatient  Remains inpatient appropriate because:Altered mental status and Inpatient level of care appropriate due to severity of illness   Dispo:  Patient From: Home  Planned Disposition: Possibly hospice  Expected discharge date: 03/27/2020  Medically stable for discharge: No  Objective: Blood pressure 106/76, pulse 78, temperature 98.2 F (36.8 C), temperature source Oral, resp. rate 20, height 6' (1.829 m), weight 81.2 kg, SpO2 97 %.  Examination: General appearance: Chronically ill-appearing elderly gentleman laying in bed in no distress but still not following commands Head: Normocephalic, without obvious abnormality, atraumatic Eyes: EOMI Lungs: coarse sounds bilaterally Heart: regular rate and rhythm and S1, S2 normal Abdomen: soft, ND, BS present Extremities: no edema Skin: warm, dry, intact Neurologic: only moving RUE with purpose; does not follow commands; remains nonverbal  Consultants:   Neurology  Palliative care medicine  Procedures:     Data Reviewed: I  have personally reviewed following labs and imaging studies Results for orders placed or performed  during the hospital encounter of 03/02/2020 (from the past 24 hour(s))  Glucose, capillary     Status: Abnormal   Collection Time: 03/22/20  4:40 PM  Result Value Ref Range   Glucose-Capillary 160 (H) 70 - 99 mg/dL   Comment 1 Notify RN    Comment 2 Document in Chart   Glucose, capillary     Status: Abnormal   Collection Time: 03/22/20  7:21 PM  Result Value Ref Range   Glucose-Capillary 130 (H) 70 - 99 mg/dL  Glucose, capillary     Status: Abnormal   Collection Time: 03/23/20 12:06 AM  Result Value Ref Range   Glucose-Capillary 129 (H) 70 - 99 mg/dL  Basic metabolic panel     Status: Abnormal   Collection Time: 03/23/20  2:54 AM  Result Value Ref Range   Sodium 137 135 - 145 mmol/L   Potassium 4.3 3.5 - 5.1 mmol/L   Chloride 98 98 - 111 mmol/L   CO2 27 22 - 32 mmol/L   Glucose, Bld 159 (H) 70 - 99 mg/dL   BUN 35 (H) 8 - 23 mg/dL   Creatinine, Ser 0.94 0.61 - 1.24 mg/dL   Calcium 8.8 (L) 8.9 - 10.3 mg/dL   GFR, Estimated >60 >60 mL/min   Anion gap 12 5 - 15  CBC with Differential/Platelet     Status: Abnormal   Collection Time: 03/23/20  2:54 AM  Result Value Ref Range   WBC 9.5 4.0 - 10.5 K/uL   RBC 3.89 (L) 4.22 - 5.81 MIL/uL   Hemoglobin 11.6 (L) 13.0 - 17.0 g/dL   HCT 36.7 (L) 39.0 - 52.0 %   MCV 94.3 80.0 - 100.0 fL   MCH 29.8 26.0 - 34.0 pg   MCHC 31.6 30.0 - 36.0 g/dL   RDW 14.2 11.5 - 15.5 %   Platelets 246 150 - 400 K/uL   nRBC 0.0 0.0 - 0.2 %   Neutrophils Relative % 68 %   Neutro Abs 6.5 1.7 - 7.7 K/uL   Lymphocytes Relative 14 %   Lymphs Abs 1.3 0.7 - 4.0 K/uL   Monocytes Relative 11 %   Monocytes Absolute 1.0 0.1 - 1.0 K/uL   Eosinophils Relative 3 %   Eosinophils Absolute 0.3 0.0 - 0.5 K/uL   Basophils Relative 1 %   Basophils Absolute 0.1 0.0 - 0.1 K/uL   Immature Granulocytes 3 %   Abs Immature Granulocytes 0.25 (H) 0.00 - 0.07 K/uL  Magnesium     Status: None   Collection Time: 03/23/20  2:54 AM  Result Value Ref Range   Magnesium 2.0 1.7 - 2.4  mg/dL  Glucose, capillary     Status: Abnormal   Collection Time: 03/23/20  3:55 AM  Result Value Ref Range   Glucose-Capillary 155 (H) 70 - 99 mg/dL  Glucose, capillary     Status: Abnormal   Collection Time: 03/23/20  7:29 AM  Result Value Ref Range   Glucose-Capillary 146 (H) 70 - 99 mg/dL   Comment 1 Notify RN    Comment 2 Document in Chart   Glucose, capillary     Status: Abnormal   Collection Time: 03/23/20 12:05 PM  Result Value Ref Range   Glucose-Capillary 119 (H) 70 - 99 mg/dL   Comment 1 Notify RN    Comment 2 Document in Chart     No results found for this or  any previous visit (from the past 240 hour(s)).   Radiology Studies: No results found. DG CHEST PORT 1 VIEW  Final Result    DG CHEST PORT 1 VIEW  Final Result    DG Abd Portable 1V  Final Result    MR BRAIN WO CONTRAST  Final Result    MR ANGIO HEAD WO CONTRAST  Final Result    DG Chest Port 1 View  Final Result    DG Abd Portable 1V  Final Result    DG Chest Port 1 View  Final Result    IR PERCUTANEOUS ART THROMBECTOMY/INFUSION INTRACRANIAL INC DIAG ANGIO  Final Result    IR CT Head Ltd  Final Result    CT Code Stroke CTA Head W/WO contrast  Final Result  Addendum 1 of 1  ADDENDUM REPORT: 03/23/2020 10:06    ADDENDUM:  These results were called by telephone at the time of interpretation  on 03/03/2020 at 10:00 am to provider MCNEILL [facility] , who  verbally acknowledged these results.      Electronically Signed    By: Primitivo Gauze M.D.    On: 03/15/2020 10:06      Final    CT Code Stroke CTA Neck W/WO contrast  Final Result  Addendum 1 of 1  ADDENDUM REPORT: 03/25/2020 10:06    ADDENDUM:  These results were called by telephone at the time of interpretation  on 03/25/2020 at 10:00 am to provider MCNEILL [facility] , who  verbally acknowledged these results.      Electronically Signed    By: Primitivo Gauze M.D.    On: 03/23/2020 10:06      Final     CT HEAD CODE STROKE WO CONTRAST  Final Result      Scheduled Meds: . amLODipine  10 mg Per Tube Daily  . aspirin  325 mg Per Tube Daily  . atorvastatin  20 mg Per Tube Daily  . carvedilol  25 mg Per Tube BID  . chlorhexidine gluconate (MEDLINE KIT)  15 mL Mouth Rinse BID  . Chlorhexidine Gluconate Cloth  6 each Topical Daily  . cholecalciferol  5,000 Units Per Tube Daily  . feeding supplement (PROSource TF)  45 mL Per Tube TID  . free water  100 mL Per Tube Q6H  . heparin injection (subcutaneous)  5,000 Units Subcutaneous Q8H  . latanoprost  1 drop Both Eyes QHS  . levothyroxine  50 mcg Per Tube QAC breakfast  . mouth rinse  15 mL Mouth Rinse 6 times per day  . pantoprazole sodium  40 mg Per Tube Daily   PRN Meds: [DISCONTINUED] acetaminophen **OR** acetaminophen (TYLENOL) oral liquid 160 mg/5 mL **OR** acetaminophen, glycopyrrolate, haloperidol lactate Continuous Infusions: . sodium chloride    . feeding supplement (OSMOLITE 1.5 CAL) 60 mL/hr at 03/23/20 0730     LOS: 12 days  Time spent: Greater than 50% of the 35 minute visit was spent in counseling/coordination of care for the patient as laid out in the A&P.   Dwyane Dee, MD [facility] 03/23/2020, 1:35 PM

## 2020-03-23 NOTE — Progress Notes (Signed)
Physical Therapy Treatment Patient Details Name: Jeffrey Ashley MRN: [id number] DOB: 1926/10/06 Today's Date: 03/23/2020    History of Present Illness Patient is a 86 y/o male who presents with left sided weakness. 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 NIH:18. s/p tpA and complete revascularization right MCA 12/11. Remained intubated due to concerns for aspiration. Extubated 12/14. PMH includes HTN and Dysrhythmia.    PT Comments    Pt tolerates treatment well, more alert initially upon PT arrival. Pt continues to demonstrate retropulsion and posterior lean with bed mobility, requiring PT physical assistance to facilitate an anterior lean. Pt remains weak and requires significant physical assistance to stand and transfer. Pt will benefit from aggressive mobilization and continued acute PT POC to reduce falls risk and caregiver burden.  Follow Up Recommendations  SNF;Supervision for mobility/OOB     Equipment Recommendations  Wheelchair (measurements PT);Wheelchair cushion (measurements PT);Hospital bed (mechanical lift, all if D/C home)    Recommendations for Other Services       Precautions / Restrictions Precautions Precautions: Fall;Other (comment) Precaution Comments: left hemi, left neglect, cortrak Restrictions Weight Bearing Restrictions: No    Mobility  Bed Mobility Overal bed mobility: Needs Assistance Bed Mobility: Supine to Sit     Supine to sit: Max assist;HOB elevated        Transfers Overall transfer level: Needs assistance Equipment used: 1 person hand held assist Transfers: Sit to/from [employer] Sit to Stand: Max assist Stand pivot transfers: Max assist       General transfer comment: PT provides cues and physical assistance for foot placement. Verbal cues for hand placement and anterior trunk lean. PT provides facilitation for pivot during transfer  Ambulation/Gait                 Stairs             Wheelchair  Mobility    Modified Rankin (Stroke Patients Only) Modified Rankin (Stroke Patients Only) Pre-Morbid Rankin Score: Moderately severe disability Modified Rankin: Severe disability     Balance Overall balance assessment: Needs assistance Sitting-balance support: Single extremity supported;Feet supported Sitting balance-Leahy Scale: Poor Sitting balance - Comments: modA with RUE support of bed Postural control: Posterior lean Standing balance support: Bilateral upper extremity supported Standing balance-Leahy Scale: Poor Standing balance comment: modA to maintain static standing with UE support of PT                            Cognition Arousal/Alertness: Awake/alert Behavior During Therapy: WFL for tasks assessed/performed Overall Cognitive Status: Impaired/Different from baseline Area of Impairment: Attention;Following commands;Safety/judgement;Awareness                   Current Attention Level: Sustained   Following Commands: Follows one step commands with increased time (with increased time and verbal cues. Improved following with RUE than RLE) Safety/Judgement: Decreased awareness of safety;Decreased awareness of deficits   Problem Solving: Slow processing;Difficulty sequencing        Exercises      General Comments General comments (skin integrity, edema, etc.): VSS on RA      Pertinent Vitals/Pain Pain Assessment: Faces Faces Pain Scale: No hurt    Home Living                      Prior Function            PT Goals (current goals can now be found in the  care plan section) Acute Rehab PT Goals Patient Stated Goal: none stated Progress towards PT goals: Progressing toward goals (slowly)    Frequency    Min 3X/week      PT Plan Current plan remains appropriate    Co-evaluation              AM-PAC PT "6 Clicks" Mobility   Outcome Measure  Help needed turning from your back to your side while in a flat bed  without using bedrails?: A Lot Help needed moving from lying on your back to sitting on the side of a flat bed without using bedrails?: A Lot Help needed moving to and from a bed to a chair (including a wheelchair)?: A Lot Help needed standing up from a chair using your arms (e.g., wheelchair or bedside chair)?: A Lot Help needed to walk in hospital room?: Total Help needed climbing 3-5 steps with a railing? : Total 6 Click Score: 10    End of Session Equipment Utilized During Treatment: Gait belt Activity Tolerance: Patient tolerated treatment well Patient left: in chair;with call bell/phone within reach;with chair alarm set Nurse Communication: Mobility status;Need for lift equipment PT Visit Diagnosis: Hemiplegia and hemiparesis Hemiplegia - Right/Left: Left Hemiplegia - dominant/non-dominant: Non-dominant Hemiplegia - caused by: Cerebral infarction     Time: 4695-0722 PT Time Calculation (min) (ACUTE ONLY): 19 min  Charges:  $Therapeutic Activity: 8-22 mins                     Arlyss Gandy, PT, DPT Acute Rehabilitation Pager: [phone]    Arlyss Gandy 03/23/2020, 3:13 PM

## 2020-03-23 NOTE — Plan of Care (Signed)
  Problem: Education: Goal: Knowledge of disease or condition will improve Outcome: Not Progressing Goal: Knowledge of secondary prevention will improve Outcome: Not Progressing   Problem: Coping: Goal: Will identify appropriate support needs Outcome: Not Progressing   Problem: Health Behavior/Discharge Planning: Goal: Ability to manage health-related needs will improve Outcome: Not Progressing

## 2020-03-23 NOTE — Consult Note (Signed)
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 He has had a prolonged and complicated hospital course including intubation and respiratory failure from severe aspiration and inability to protect his air way. His overall prognosis is poor for meaningful recovery.  I spoke with his son, Jeffrey Franco, MD from [facility] about his dad's condition. Family have been struggling over making the best decisions for his care based on his previously stated wishes about QOL and in the context of his advanced age and baseline frailty.  They are strongly considering a comfort care transition. I discussed what that trajectory may look like including discontinuation of artifical nutrition and treating signs of pain and suffering.   If a comfort care transition is made I strongly recommend a hospice facility for his care and anticipate the need for advanced symptom management with his overt aspiration and stroke sequela.  I stressed to family that if a decision is made to stop artifical nutrition that such a decision would not be the cause of death, rather the patient is dying of his large stroke  and irreversible brain damage.  His son will speak with family this evening and will contact me tomorrow to follow up.  Anderson Malta, DO Palliative Medicine  Time: 50 minutes Greater than 50%  of this time was spent counseling and coordinating care related to the above assessment and plan.

## 2020-03-23 NOTE — Progress Notes (Signed)
SLP Cancellation Note  Patient Details Name: Jeffrey Ashley MRN: [id number] DOB: 1926-09-15   Cancelled treatment:       Reason Eval/Treat Not Completed: Other (comment) (Pt's family has decided on comfort care and plan is for d/c to hospice house. SLP will s/o but please re-consult if clinically indicated.)  Davi Kroon I. Vear Clock, MS, CCC-SLP Acute Rehabilitation Services Office number [phone] Pager [phone]  Scheryl Marten 03/23/2020, 4:20 PM

## 2020-03-24 LAB — GLUCOSE, CAPILLARY
Glucose-Capillary: 117 mg/dL — ABNORMAL HIGH (ref 70–99)
Glucose-Capillary: 133 mg/dL — ABNORMAL HIGH (ref 70–99)
Glucose-Capillary: 139 mg/dL — ABNORMAL HIGH (ref 70–99)
Glucose-Capillary: 145 mg/dL — ABNORMAL HIGH (ref 70–99)
Glucose-Capillary: 149 mg/dL — ABNORMAL HIGH (ref 70–99)

## 2020-03-24 NOTE — Progress Notes (Signed)
PROGRESS NOTE    Jeffrey Ashley   GYK:[id number]  DOB: 04-22-1926  DOA: 03/21/2020     13  PCP: Venia Carbon, MD  CC: weakness  Hospital Course: Jeffrey Ashley is a 86 yo male with PMH hypothyroidism, hypertension, macular degeneration, chronic A. fib, dementia, BPH who presented to the hospital with left-sided weakness.  Work-up was ultimately notable for a large right M1 segment LVO.  He underwent administration of TPA and thrombectomy on 03/28/2020. Clinically he had minimal improvement and required initiation of tube feeds via coretrak.  He was followed by neurology during hospitalization who also agreed that patient's prognosis was poor with low chance of meaningful recovery. After several days of ongoing monitoring, patient continued to have significant secretions with concern for aspiration due to inability to clear secretions. On 03/21/2020, status was discussed with patient's son, Jeffrey Ashley.  Clinical course and prognosis was discussed.  Jeffrey Ashley was in agreement for pursuing palliative care consult for further goals of care discussions in anticipation of pursuing hospice/comfort care.   Interval History:  Palliative care talked with [facility] yesterday.  Decision was made to transition to comfort care and remove feeding tube and stop tube feeds.  Patient resting in bed comfortable this morning.  Old records reviewed in assessment of this patient  ROS: Review of systems not obtained due to patient factors.  Cognitive impairment  Assessment & Plan: * Middle cerebral artery embolism, right - s/p TPA and thrombectomy on 12/11 - per CTA head: "Right M1 segment large vessel occlusion." Patient has not improved since admission with aggressive measures - discussed at length with Jeffrey Ashley on phone on 12/21 [facility] consulted; tentative plan is to pursue comfort/hospice but family would like formal palliative care consult at this point -Palliative care has discussed case with family.  At this  time patient is being transitioned to comfort care.  Tube feeds are being discontinued and core trak removed. -Family coming for a visit on 09-Apr-2020 to see patient.  Dysphagia - remains severe; still failed SLP eval again on 12/20 - keep NPO - still do not recommend PEG as this will prolong poor quality of life and increase risk of developing further complications and does not decrease aspiration risk -Tube feeds and core trak discontinued  Protein-calorie malnutrition, severe - TF discontinued.  Okay to remove core trak  Hypothyroidism -Continue Synthroid  Essential hypertension - BP improved now -Scheduled Lopressor now ordered  Atrial fibrillation, chronic (HCC) -Anticoagulation with Coumadin discontinued due to subdural hematoma -Aspirin discontinued now with transition to comfort   DVT prophylaxis: HSQ Code Status: DNR Family Communication:  Disposition Plan: Status is: Inpatient  Remains inpatient appropriate because:Altered mental status and Inpatient level of care appropriate due to severity of illness   Dispo:  Patient From: Home  Planned Disposition: Possibly hospice  Expected discharge date: 03/27/2020  Medically stable for discharge: No  Objective: Blood pressure (!) 152/97, pulse 70, temperature 98 F (36.7 C), temperature source Oral, resp. rate 20, height 6' (1.829 m), weight 81.2 kg, SpO2 99 %.  Examination: General appearance: Chronically ill-appearing elderly gentleman laying in bed in no distress but still not following commands Head: Normocephalic, without obvious abnormality, atraumatic Eyes: EOMI Lungs: coarse sounds bilaterally Heart: regular rate and rhythm and S1, S2 normal Abdomen: soft, ND, BS present Extremities: no edema Skin: warm, dry, intact Neurologic: only moving RUE with purpose; does not follow commands; remains nonverbal  Consultants:   Neurology - signed off  Palliative care medicine  Procedures:     Data Reviewed:  I have personally reviewed following labs and imaging studies Results for orders placed or performed during the hospital encounter of 03/09/2020 (from the past 24 hour(s))  Glucose, capillary     Status: Abnormal   Collection Time: 03/23/20 12:05 PM  Result Value Ref Range   Glucose-Capillary 119 (H) 70 - 99 mg/dL   Comment 1 Notify RN    Comment 2 Document in Chart   Glucose, capillary     Status: Abnormal   Collection Time: 03/23/20  3:31 PM  Result Value Ref Range   Glucose-Capillary 134 (H) 70 - 99 mg/dL   Comment 1 Notify RN    Comment 2 Document in Chart   Glucose, capillary     Status: Abnormal   Collection Time: 03/23/20  7:41 PM  Result Value Ref Range   Glucose-Capillary 113 (H) 70 - 99 mg/dL   Comment 1 Notify RN    Comment 2 Document in Chart   Glucose, capillary     Status: Abnormal   Collection Time: 03/24/20  8:15 AM  Result Value Ref Range   Glucose-Capillary 117 (H) 70 - 99 mg/dL   Comment 1 Notify RN    Comment 2 Document in Chart     No results found for this or any previous visit (from the past 240 hour(s)).   Radiology Studies: No results found. DG CHEST PORT 1 VIEW  Final Result    DG CHEST PORT 1 VIEW  Final Result    DG Abd Portable 1V  Final Result    MR BRAIN WO CONTRAST  Final Result    MR ANGIO HEAD WO CONTRAST  Final Result    DG Chest Port 1 View  Final Result    DG Abd Portable 1V  Final Result    DG Chest Port 1 View  Final Result    IR PERCUTANEOUS ART THROMBECTOMY/INFUSION INTRACRANIAL INC DIAG ANGIO  Final Result    IR CT Head Ltd  Final Result    CT Code Stroke CTA Head W/WO contrast  Final Result  Addendum 1 of 1  ADDENDUM REPORT: 03/23/2020 10:06    ADDENDUM:  These results were called by telephone at the time of interpretation  on 03/02/2020 at 10:00 am to provider MCNEILL [facility] , who  verbally acknowledged these results.      Electronically Signed    By: Primitivo Gauze M.D.    On: 03/24/2020  10:06      Final    CT Code Stroke CTA Neck W/WO contrast  Final Result  Addendum 1 of 1  ADDENDUM REPORT: 03/07/2020 10:06    ADDENDUM:  These results were called by telephone at the time of interpretation  on 03/12/2020 at 10:00 am to provider MCNEILL [facility] , who  verbally acknowledged these results.      Electronically Signed    By: Primitivo Gauze M.D.    On: 03/18/2020 10:06      Final    CT HEAD CODE STROKE WO CONTRAST  Final Result      Scheduled Meds:  chlorhexidine gluconate (MEDLINE KIT)  15 mL Mouth Rinse BID   Chlorhexidine Gluconate Cloth  6 each Topical Daily   glycopyrrolate  0.2 mg Intravenous Q4H   latanoprost  1 drop Both Eyes QHS   mouth rinse  15 mL Mouth Rinse 6 times per day   metoprolol tartrate  2.5 mg Intravenous Q8H   PRN Meds: [DISCONTINUED] acetaminophen **OR**  acetaminophen (TYLENOL) oral liquid 160 mg/5 mL **OR** acetaminophen, haloperidol lactate, HYDROmorphone (DILAUDID) injection, LORazepam Continuous Infusions:  dextrose 5 % and 0.9% NaCl 50 mL/hr at 03/24/20 1002     LOS: 13 days  Time spent: Greater than 50% of the 35 minute visit was spent in counseling/coordination of care for the patient as laid out in the A&P.   Dwyane Dee, MD [facility] 03/24/2020, 12:03 PM

## 2020-03-24 NOTE — Progress Notes (Signed)
Nutrition Brief Note  Chart reviewed. Pt now transitioning to comfort care.  No further nutrition interventions warranted at this time.  Please re-consult as needed.   Aljean Horiuchi, MS, RD, LDN RD pager number and weekend/on-call pager number located in Amion.    

## 2020-03-24 NOTE — TOC Progression Note (Signed)
Transition of Care [facility]) - Progression Note    Patient Details  Name: Tanner Yeley MRN: [id number] Date of Birth: [birth year]  [facility] CM/SW Contact  Lorri Frederick, LCSW Phone Number: 03/24/2020, 12:14 PM  Clinical Narrative:   CSW spoke with Chrislyn at [facility].  She took the referral and will reach out to family with goal of residential hospice early next week.     Expected Discharge Plan: Skilled Nursing Facility Barriers to Discharge: Continued Medical Work up  Expected Discharge Plan and Services Expected Discharge Plan: Skilled Nursing Facility In-house Referral: Clinical Social Work Discharge Planning Services: CM Consult Post Acute Care Choice: Skilled Nursing Facility Living arrangements for the past 2 months: Assisted Living Facility                                       Social Determinants of Health (SDOH) Interventions    Readmission Risk Interventions No flowsheet data found.

## 2020-03-24 NOTE — Progress Notes (Addendum)
Civil engineer, contracting [facility]) Hospital Liaison note.   Addendum: Interest is in [facility] in [place], not [facility]. Bed availability is for Hospice [facility], not [facility].  Received request from [facility] manager for family interest in [facility]. Spoke with son Dr. Laural Benes by phone to confirm interest and to explain services. Son verbalizes understanding of residential hospice, confirms plan to keep his father at [facility] until Monday.  Bed available today but cannot be guaranteed for Monday; Dr. Laural Benes verbalized understanding of this. Contact information provided.  Plan made for [facility] liaisons to follow over the weekend and to seek bed for transfer on Monday.  Thank you for the opportunity to participate in this patient's care.  Chrislyn Brooke Dare, BSN, RN [facility] Liaison (listed on [place] under Hospice[facility])    [phone]

## 2020-03-25 DIAGNOSIS — Z66 Do not resuscitate: Secondary | ICD-10-CM

## 2020-03-25 DIAGNOSIS — Z515 Encounter for palliative care: Secondary | ICD-10-CM

## 2020-03-25 DIAGNOSIS — Z7189 Other specified counseling: Secondary | ICD-10-CM

## 2020-03-25 LAB — GLUCOSE, CAPILLARY
Glucose-Capillary: 158 mg/dL — ABNORMAL HIGH (ref 70–99)
Glucose-Capillary: 156 mg/dL — ABNORMAL HIGH (ref 70–99)
Glucose-Capillary: 183 mg/dL — ABNORMAL HIGH (ref 70–99)
Glucose-Capillary: 186 mg/dL — ABNORMAL HIGH (ref 70–99)

## 2020-03-25 NOTE — Progress Notes (Signed)
PROGRESS NOTE    Jeffrey Ashley   XKG:[id number]  DOB: [birth year]  DOA: 03/14/2020     14  PCP: Venia Carbon, MD  CC: weakness  Hospital Course: Jeffrey Ashley is a 86 yo male with PMH hypothyroidism, hypertension, macular degeneration, chronic A. fib, dementia, BPH who presented to the hospital with left-sided weakness.  Work-up was ultimately notable for a large right M1 segment LVO.  He underwent administration of TPA and thrombectomy on 03/21/2020. Clinically he had minimal improvement and required initiation of tube feeds via coretrak.  He was followed by neurology during hospitalization who also agreed that patient's prognosis was poor with low chance of meaningful recovery. After several days of ongoing monitoring, patient continued to have significant secretions with concern for aspiration due to inability to clear secretions. On 03/21/2020, status was discussed with patient's son, Jeffrey Ashley.  Clinical course and prognosis was discussed.  Jeffrey Ashley was in agreement for pursuing palliative care consult for further goals of care discussions in anticipation of pursuing hospice/comfort care.   Interval History:  More comfortable appearing today.  Mitten is off of his right hand.  Nonpurposefully moving his right upper extremity at this time and he is essentially nonarousable.  Old records reviewed in assessment of this patient  ROS: Review of systems not obtained due to patient factors.  Cognitive impairment  Assessment & Plan: * Middle cerebral artery embolism, right - s/p TPA and thrombectomy on 12/11 - per CTA head: "Right M1 segment large vessel occlusion." Patient has not improved since admission with aggressive measures - discussed at length with Jeffrey Ashley on phone on 12/21 [facility] consulted; tentative plan is to pursue comfort/hospice but family would like formal palliative care consult at this point -Palliative care has discussed case with family.  At this time patient is being  transitioned to comfort care.  Tube feeds are being discontinued and core trak removed. -Family coming for a visit on 2020/03/27 to see patient.  Dysphagia - remains severe; still failed SLP eval again on 12/20 - keep NPO - still do not recommend PEG as this will prolong poor quality of life and increase risk of developing further complications and does not decrease aspiration risk -Tube feeds and core trak discontinued  Protein-calorie malnutrition, severe - TF discontinued.  Okay to remove core trak  Hypothyroidism -Continue Synthroid  Essential hypertension - BP improved now -Scheduled Lopressor now ordered  Atrial fibrillation, chronic (HCC) -Anticoagulation with Coumadin discontinued due to subdural hematoma -Aspirin discontinued now with transition to comfort   DVT prophylaxis: HSQ Code Status: DNR Family Communication:  Disposition Plan: Status is: Inpatient  Remains inpatient appropriate because:Altered mental status and Inpatient level of care appropriate due to severity of illness   Dispo:  Patient From: Home  Planned Disposition: Comfort care  Expected discharge date: 03/27/2020  Medically stable for discharge: No  Objective: Blood pressure (!) 149/85, pulse 69, temperature 98.3 F (36.8 C), temperature source Axillary, resp. rate 18, height 6' (1.829 m), weight 81.2 kg, SpO2 99 %.  Examination: General appearance: Chronically ill-appearing elderly gentleman laying in bed in no distress but still not following commands; much less responsive today Head: Normocephalic, without obvious abnormality, atraumatic Eyes: Pupils minimally responsive to light.  No extraocular movements appreciated Lungs: coarse sounds bilaterally Heart: regular rate and rhythm and S1, S2 normal Abdomen: soft, ND, BS present Extremities: no edema Skin: warm, dry, intact Neurologic: only moving RUE, now nonpurposefully; does not follow commands; remains nonverbal; was unable to arouse  him today  Consultants:   Neurology - signed off  Palliative care medicine  Procedures:     Data Reviewed: I have personally reviewed following labs and imaging studies Results for orders placed or performed during the hospital encounter of 03/22/2020 (from the past 24 hour(s))  Glucose, capillary     Status: Abnormal   Collection Time: 03/24/20 12:43 PM  Result Value Ref Range   Glucose-Capillary 145 (H) 70 - 99 mg/dL   Comment 1 Notify RN    Comment 2 Document in Chart   Glucose, capillary     Status: Abnormal   Collection Time: 03/24/20  4:41 PM  Result Value Ref Range   Glucose-Capillary 149 (H) 70 - 99 mg/dL   Comment 1 Notify RN    Comment 2 Document in Chart   Glucose, capillary     Status: Abnormal   Collection Time: 03/24/20  7:55 PM  Result Value Ref Range   Glucose-Capillary 133 (H) 70 - 99 mg/dL   Comment 1 Notify RN    Comment 2 Document in Chart   Glucose, capillary     Status: Abnormal   Collection Time: 03/24/20 11:52 PM  Result Value Ref Range   Glucose-Capillary 139 (H) 70 - 99 mg/dL   Comment 1 Notify RN    Comment 2 Document in Chart   Glucose, capillary     Status: Abnormal   Collection Time: 03/25/20  4:08 AM  Result Value Ref Range   Glucose-Capillary 158 (H) 70 - 99 mg/dL   Comment 1 Notify RN    Comment 2 Document in Chart   Glucose, capillary     Status: Abnormal   Collection Time: 03/25/20  7:29 AM  Result Value Ref Range   Glucose-Capillary 156 (H) 70 - 99 mg/dL   Comment 1 Notify RN    Comment 2 Document in Chart     No results found for this or any previous visit (from the past 240 hour(s)).   Radiology Studies: No results found. DG CHEST PORT 1 VIEW  Final Result    DG CHEST PORT 1 VIEW  Final Result    DG Abd Portable 1V  Final Result    MR BRAIN WO CONTRAST  Final Result    MR ANGIO HEAD WO CONTRAST  Final Result    DG Chest Port 1 View  Final Result    DG Abd Portable 1V  Final Result    DG Chest Port 1 View   Final Result    IR PERCUTANEOUS ART THROMBECTOMY/INFUSION INTRACRANIAL INC DIAG ANGIO  Final Result    IR CT Head Ltd  Final Result    CT Code Stroke CTA Head W/WO contrast  Final Result  Addendum 1 of 1  ADDENDUM REPORT: 03/07/2020 10:06    ADDENDUM:  These results were called by telephone at the time of interpretation  on 03/10/2020 at 10:00 am to provider MCNEILL [facility] , who  verbally acknowledged these results.      Electronically Signed    By: Primitivo Gauze M.D.    On: 03/15/2020 10:06      Final    CT Code Stroke CTA Neck W/WO contrast  Final Result  Addendum 1 of 1  ADDENDUM REPORT: 03/15/2020 10:06    ADDENDUM:  These results were called by telephone at the time of interpretation  on 03/22/2020 at 10:00 am to provider MCNEILL [facility] , who  verbally acknowledged these results.      Electronically Signed  By: Primitivo Gauze M.D.    On: 03/06/2020 10:06      Final    CT HEAD CODE STROKE WO CONTRAST  Final Result      Scheduled Meds: . chlorhexidine gluconate (MEDLINE KIT)  15 mL Mouth Rinse BID  . Chlorhexidine Gluconate Cloth  6 each Topical Daily  . glycopyrrolate  0.2 mg Intravenous Q4H  . latanoprost  1 drop Both Eyes QHS  . mouth rinse  15 mL Mouth Rinse 6 times per day  . metoprolol tartrate  2.5 mg Intravenous Q8H   PRN Meds: [DISCONTINUED] acetaminophen **OR** acetaminophen (TYLENOL) oral liquid 160 mg/5 mL **OR** acetaminophen, haloperidol lactate, HYDROmorphone (DILAUDID) injection, LORazepam Continuous Infusions: . dextrose 5 % and 0.9% NaCl 50 mL/hr at 03/25/20 0513     LOS: 14 days  Time spent: Greater than 50% of the 35 minute visit was spent in counseling/coordination of care for the patient as laid out in the A&P.   Dwyane Dee, MD [facility] 03/25/2020, 10:55 AM

## 2020-03-25 NOTE — Progress Notes (Signed)
   Palliative Medicine Inpatient Follow Up Note  HPI: [name] is a 86 yo male with PMH hypothyroidism, hypertension, macular degeneration, chronic A. fib, dementia, BPH who presented to the hospital with left-sided weakness.Transitioned to comfort care or 12/23 in the setting of poor neurological prognosis for recovery.  Today's Discussion (03/25/2020): Chart reviewed.   I met with nursing staff at bedside this morning. They share with me that Jeffrey Ashley did not have any issues overnight.  Per MAR review Jeffrey Ashley has not required any additional as needed's since last evening.  Jeffrey Ashley himself upon assessment this morning is picking at his sheet. He is not in any way agitated and is grabbing to hold someone's hand. Provided my hand to be held for a period of time.  Questions and concerns addressed   SUMMARY OF RECOMMENDATIONS   DNAR/DNI  Comfort focused care  Continue D5 until Sunday family will be present to visit  Continue IV as needed medications per [facility]  Liberalize visitation policy  [facility] is involved to aid in finding inpatient hospice bed placement  Ongoing palliative care support  Time Spent: 15 Greater than 50% of the time was spent in counseling and coordination of care ______________________________________________________________________________________ [place] Team Team Cell Phone: [phone] Please utilize secure chat with additional questions, if there is no response within 30 minutes please call the above phone number  Palliative Medicine Team providers are available by phone from 7am to 7pm daily and can be reached through the team cell phone.  Should this patient require assistance outside of these hours, please call the patient's attending physician.

## 2020-03-26 DIAGNOSIS — R1310 Dysphagia, unspecified: Secondary | ICD-10-CM

## 2020-03-26 DIAGNOSIS — I482 Chronic atrial fibrillation, unspecified: Secondary | ICD-10-CM

## 2020-04-01 NOTE — Discharge Summary (Signed)
Death Summary  Bard Haupert ZOX:[id number] DOB: [birth year] DOA: 03/17/20  PCP: Karie Schwalbe, MD  Admit date: Mar 17, 2020 Date of Death: 2020/04/01 Time of Death: 0310 Notification: Karie Schwalbe, MD notified of death of 04-01-20   History of present illness:  Mr. Laural Ashley is a 87 yo male with PMH hypothyroidism, hypertension, macular degeneration, chronic A. fib, dementia, BPH who presented to the hospital with left-sided weakness.  Work-up was ultimately notable for a large right M1 segment LVO.  He underwent administration of TPA and thrombectomy on 03-17-2020. Clinically he had minimal improvement and required initiation of tube feeds via coretrak.  He was followed by neurology during hospitalization who also agreed that patient's prognosis was poor with low chance of meaningful recovery. After several days of ongoing monitoring, patient continued to have significant secretions with concern for aspiration due to inability to clear secretions. On 03/21/2020, status was discussed with patient's son, Jeffrey Ashley.  Clinical course and prognosis was discussed.  Jeffrey Ashley was in agreement for pursuing palliative care consult for further goals of care discussions in anticipation of pursuing hospice/comfort care. After discussion with palliative care, the patient was transitioned to comfort care measures.  Tube feeds were discontinued and his core trak was removed. The patient continued to progressively decline and he passed naturally on 04/01/2020.  Per nursing, time of death was 0310.    Final Diagnoses:  Acute CVA (involving right MCA) Severe dysphagia Severe protein calorie malnutrition   The results of significant diagnostics from this hospitalization (including imaging, microbiology, ancillary and laboratory) are listed below for reference.    Significant Diagnostic Studies: CT Code Stroke CTA Head W/WO contrast  Addendum Date: March 17, 2020   ADDENDUM REPORT: 03-17-20 10:06  ADDENDUM: These results were called by telephone at the time of interpretation on 2020/03/17 at 10:00 am to provider MCNEILL [facility] , who verbally acknowledged these results. Electronically Signed   By: Stana Bunting M.D.   On: 2020/03/17 10:06   Result Date: 03-17-20 CLINICAL DATA:  Focal neuro deficit, > 6 hrs, stroke suspected EXAM: CT ANGIOGRAPHY HEAD AND NECK TECHNIQUE: Multidetector CT imaging of the head and neck was performed using the standard protocol during bolus administration of intravenous contrast. Multiplanar CT image reconstructions and MIPs were obtained to evaluate the vascular anatomy. Carotid stenosis measurements (when applicable) are obtained utilizing NASCET criteria, using the distal internal carotid diameter as the denominator. CONTRAST:  75mL OMNIPAQUE IOHEXOL 350 MG/ML SOLN COMPARISON:  03-17-20 head CT and prior. FINDINGS: CTA NECK FINDINGS Aortic arch: Mild aortic arch atherosclerotic calcifications. No dissection or aneurysm. Patent great vessel origins. Standard branching. Right carotid system: Patent. Carotid bifurcation calcified and noncalcified atheromatous disease with 50% proximal ICA luminal narrowing. Left carotid system: Patent. Carotid bifurcation calcified and noncalcified atheromatous disease with approximately 30% luminal narrowing. Vertebral arteries: Codominant. Patent. Mild to moderate left vertebral artery origin narrowing secondary to noncalcified atheromatous plaque. Skeleton: No acute finding.  Multilevel spondylosis. Other neck: No adenopathy.  No soft tissue mass. Upper chest: Minimal subsegmental atelectasis.  No acute finding. Review of the MIP images confirms the above findings CTA HEAD FINDINGS Anterior circulation: Right M1 segment large vessel occlusion. Patent ICAs. Bilateral carotid siphon atherosclerotic calcifications. Patent ACAs. Patent left MCA. Posterior circulation: Patent. Bilateral V4 segment atherosclerotic calcifications with  mild luminal narrowing. Diminutive basilar artery. Patent superior cerebellar and posterior cerebral arteries. Fetal origin of the right PCA. Mild multifocal narrowing involving the right P3 and left P2 segments. Venous sinuses: As permitted by contrast  timing, patent. Anatomic variants: Please see above. Review of the MIP images confirms the above findings IMPRESSION: Right M1 segment large vessel occlusion. No large vessel occlusion within the neck. 50% right and 30% left proximal ICA luminal narrowing. Mild-to-moderate left vertebral artery origin narrowing. Mild narrowing of the bilateral V4 segments, right P3 and left P2 segments. Electronically Signed: By: Stana Bunting M.D. On: 03/27/2020 09:58   CT Code Stroke CTA Neck W/WO contrast  Addendum Date: 03/04/2020   ADDENDUM REPORT: 03/18/2020 10:06 ADDENDUM: These results were called by telephone at the time of interpretation on 03/30/2020 at 10:00 am to provider MCNEILL [facility] , who verbally acknowledged these results. Electronically Signed   By: Stana Bunting M.D.   On: 03/31/2020 10:06   Result Date: 03/19/2020 CLINICAL DATA:  Focal neuro deficit, > 6 hrs, stroke suspected EXAM: CT ANGIOGRAPHY HEAD AND NECK TECHNIQUE: Multidetector CT imaging of the head and neck was performed using the standard protocol during bolus administration of intravenous contrast. Multiplanar CT image reconstructions and MIPs were obtained to evaluate the vascular anatomy. Carotid stenosis measurements (when applicable) are obtained utilizing NASCET criteria, using the distal internal carotid diameter as the denominator. CONTRAST:  75mL OMNIPAQUE IOHEXOL 350 MG/ML SOLN COMPARISON:  03/17/2020 head CT and prior. FINDINGS: CTA NECK FINDINGS Aortic arch: Mild aortic arch atherosclerotic calcifications. No dissection or aneurysm. Patent great vessel origins. Standard branching. Right carotid system: Patent. Carotid bifurcation calcified and noncalcified  atheromatous disease with 50% proximal ICA luminal narrowing. Left carotid system: Patent. Carotid bifurcation calcified and noncalcified atheromatous disease with approximately 30% luminal narrowing. Vertebral arteries: Codominant. Patent. Mild to moderate left vertebral artery origin narrowing secondary to noncalcified atheromatous plaque. Skeleton: No acute finding.  Multilevel spondylosis. Other neck: No adenopathy.  No soft tissue mass. Upper chest: Minimal subsegmental atelectasis.  No acute finding. Review of the MIP images confirms the above findings CTA HEAD FINDINGS Anterior circulation: Right M1 segment large vessel occlusion. Patent ICAs. Bilateral carotid siphon atherosclerotic calcifications. Patent ACAs. Patent left MCA. Posterior circulation: Patent. Bilateral V4 segment atherosclerotic calcifications with mild luminal narrowing. Diminutive basilar artery. Patent superior cerebellar and posterior cerebral arteries. Fetal origin of the right PCA. Mild multifocal narrowing involving the right P3 and left P2 segments. Venous sinuses: As permitted by contrast timing, patent. Anatomic variants: Please see above. Review of the MIP images confirms the above findings IMPRESSION: Right M1 segment large vessel occlusion. No large vessel occlusion within the neck. 50% right and 30% left proximal ICA luminal narrowing. Mild-to-moderate left vertebral artery origin narrowing. Mild narrowing of the bilateral V4 segments, right P3 and left P2 segments. Electronically Signed: By: Stana Bunting M.D. On: 03/10/2020 09:58   MR ANGIO HEAD WO CONTRAST  Result Date: 03/12/2020 CLINICAL DATA:  Stroke, follow up EXAM: MRI HEAD WITHOUT CONTRAST MRA HEAD WITHOUT CONTRAST TECHNIQUE: Multiplanar, multiecho pulse sequences of the brain and surrounding structures were obtained without intravenous contrast. Angiographic images of the head were obtained using MRA technique without contrast. COMPARISON:  03/01/2020 and  prior. FINDINGS: MRI HEAD FINDINGS Brain: Sequela of acute/subacute right MCA territory insult with petechial hemorrhages. Tiny right cerebellar acute infarct. No midline shift, ventriculomegaly or extra-axial fluid collection. No mass lesion. Mild cerebral atrophy with ex vacuo dilatation. Chronic microvascular ischemic changes. Chronic lacunar insults involving the right caudate, posterior limb of the right internal capsule and left insula. Remote right cerebellar insult. Vascular: Please see MRA. Skull and upper cervical spine: Prior right frontoparietal burr holes. Sinuses/Orbits: Sequela of  bilateral lens replacement. Ethmoid and left maxillary sinus mucosal thickening. Other: None. MRA HEAD FINDINGS Anterior circulation: Interval revascularization of the right MCA. Patent ICAs. Patent ACAs. Patent left MCA with mild M2 segment narrowing. Posterior circulation: Patent. Mild bilateral V4 segments irregularity and mild right narrowing likely reflects atheromatous disease. Patent PICA. Patent basilar and superior cerebellar arteries. Patent posterior cerebral arteries. Fetal origin of the right PCA. Mild irregularity and narrowing of the right P2 segment likely secondary to atherosclerotic disease. Venous sinuses: No evidence of thrombosis. Anatomic variants: Please see above. IMPRESSION: Sequela of acute/subacute right MCA territory infarct. Small acute right cerebellar insult. Interval revascularization of the right MCA. No high-grade intracranial narrowing or large vessel occlusion. Mild narrowing of the right V4, right P2 and left M2 segments. Electronically Signed   By: Stana Bunting M.D.   On: 03/12/2020 11:21   MR BRAIN WO CONTRAST  Result Date: 03/12/2020 CLINICAL DATA:  Stroke, follow up EXAM: MRI HEAD WITHOUT CONTRAST MRA HEAD WITHOUT CONTRAST TECHNIQUE: Multiplanar, multiecho pulse sequences of the brain and surrounding structures were obtained without intravenous contrast. Angiographic  images of the head were obtained using MRA technique without contrast. COMPARISON:  Apr 02, 2020 and prior. FINDINGS: MRI HEAD FINDINGS Brain: Sequela of acute/subacute right MCA territory insult with petechial hemorrhages. Tiny right cerebellar acute infarct. No midline shift, ventriculomegaly or extra-axial fluid collection. No mass lesion. Mild cerebral atrophy with ex vacuo dilatation. Chronic microvascular ischemic changes. Chronic lacunar insults involving the right caudate, posterior limb of the right internal capsule and left insula. Remote right cerebellar insult. Vascular: Please see MRA. Skull and upper cervical spine: Prior right frontoparietal burr holes. Sinuses/Orbits: Sequela of bilateral lens replacement. Ethmoid and left maxillary sinus mucosal thickening. Other: None. MRA HEAD FINDINGS Anterior circulation: Interval revascularization of the right MCA. Patent ICAs. Patent ACAs. Patent left MCA with mild M2 segment narrowing. Posterior circulation: Patent. Mild bilateral V4 segments irregularity and mild right narrowing likely reflects atheromatous disease. Patent PICA. Patent basilar and superior cerebellar arteries. Patent posterior cerebral arteries. Fetal origin of the right PCA. Mild irregularity and narrowing of the right P2 segment likely secondary to atherosclerotic disease. Venous sinuses: No evidence of thrombosis. Anatomic variants: Please see above. IMPRESSION: Sequela of acute/subacute right MCA territory infarct. Small acute right cerebellar insult. Interval revascularization of the right MCA. No high-grade intracranial narrowing or large vessel occlusion. Mild narrowing of the right V4, right P2 and left M2 segments. Electronically Signed   By: Stana Bunting M.D.   On: 03/12/2020 11:21   IR CT Head Ltd  Result Date: 03/14/2020 INDICATION: New onset of left-sided weakness, right gaze deviation and severe dysarthria. Occluded right middle cerebral artery M1 segment on CT  angiogram of the head and neck. EXAM: 1. EMERGENT LARGE VESSEL OCCLUSION THROMBOLYSIS (anterior CIRCULATION) COMPARISON:  CT angiogram of the head and neck 2020-04-02. MEDICATIONS: Ancef 2 g IV antibiotic was administered within 1 hour of the procedure. ANESTHESIA/SEDATION: General anesthesia CONTRAST:  Isovue 300 approximately 50 mL FLUOROSCOPY TIME:  Fluoroscopy Time: 25 minutes 12 seconds (1204 mGy). COMPLICATIONS: None immediate. TECHNIQUE: Following a full explanation of the procedure along with the potential associated complications, an informed witnessed consent was obtained from the patient's son. The risks of intracranial hemorrhage of 10%, worsening neurological deficit, ventilator dependency, death and inability to revascularize were all reviewed in detail with the patient's son. The patient was then put under general anesthesia by the Department of Anesthesiology at [facility]. The right groin  was prepped and draped in the usual sterile fashion. Thereafter using modified Seldinger technique, transfemoral access into the right common femoral artery was obtained without difficulty. Over a 0.035 inch guidewire an 8 French 25 Pinnacle sheath was inserted. Through this, and also over a 0.035 inch guidewire a 5 [place]French JB 1 catheter was advanced to the aortic arch region and selectively positioned in the right common carotid artery. FINDINGS: The right common carotid arteriogram demonstrates the right external carotid artery and its major branches to be widely patent. The right internal carotid artery at the bulb has approximately 30-40% stenosis by the NASCET criteria due to a smooth atherosclerotic plaque along the posterior wall without evidence of ulcerations. More distally the vessel is seen to opacify to the cranial skull base. The petrous, the cavernous and the supraclinoid segments are widely patent. The right posterior communicating artery is seen opacifying the right posterior cerebral  artery distribution. The right anterior cerebral artery opacifies into the capillary and venous phases. Moderate severe narrowing of the right anterior cerebral artery proximal pericallosal artery is seen. 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 PROCEDURE: A diagnostic JB 1 catheter in the right common carotid artery was exchanged over a 0.035 inch 300 cm Rosen exchange guidewire for an 087 balloon guide which had been prepped with 50% contrast and 50% heparinized saline infusion advanced to the distal right internal carotid artery at the cranial skull base. Guidewire was removed. Good aspiration obtained from the hub of the balloon guide catheter. A gentle control arteriogram performed through this demonstrated no evidence of spasms, dissections or of intraluminal filling defects. Over a 0.014 inch standard Synchro micro guidewire with a J configuration, a combination of a Headway 21-2 tip microcatheter inside of an 071 136 cm Zoom aspiration catheter was advanced to the supraclinoid right ICA. The micro guidewire was then advanced using a torque device through the occluded right middle cerebral artery to the inferior division M2 M3 region followed by the microcatheter. The Zoom catheter was advanced to the supraclinoid right ICA. The guidewire was removed. Good aspiration obtained from the hub of the microcatheter. A gentle control arteriogram performed through this demonstrates safe position of the tip of the microcatheter. This was then connected to continuous heparinized saline infusion. A Tiger 17 retrieval device was then prepped and purged in heparinized saline infusion. This was then advanced in a coaxial manner with constant heparinized saline infusion to the distal end of the microcatheter. The O ring on the delivery microcatheter was loosened. With slight forward gentle traction with the right hand on the delivery micro guidewire with the left hand the delivery  catheter was retrieved unsheathing the retrieval device. The proximal portion of the retrieval device was engaged just inside the proximal aspect of the occluded right middle cerebral artery. Thereafter, the device was expanded and decreased in the proximal right M1 segment for about 2 minutes. Aspiration catheter was then advanced to just inside proximal occlusion. With constant aspiration being applied the hub of the aspiration catheter using a Penumbra aspiration device, and with a 60 mL syringe at the hub of the balloon catheter with proximal flow arrest for approximately 2 minutes, the combination was retrieved and removed. Upon retrieval, a prominent clot was seen in the retrieval device. Flow arrest was then reversed. A control arteriogram performed through the balloon guide catheter in the right internal carotid artery now demonstrated complete angiographic revascularization of the right middle cerebral artery. Patency  of the right anterior cerebral artery and the right posterior communicating artery with subsequent flow into the right posterior cerebral artery was maintained. Mild spasm noted in the dominant M2 branch which responded to 50 mcg of intra arterial nitroglycerin. A final control arteriogram performed with the balloon catheter in the right common carotid artery demonstrated no change in the extracranial or intracranial internal carotid artery. A TICI 3 revascularization was maintained in the right middle and the right anterior cerebral artery distributions. Throughout the procedure, the patient's blood pressure and neurological status remained stable. No evidence of extravasation, mass-effect or midline shift was seen. Balloon guide was then removed. The 8 French Pinnacle sheath was removed and replaced with an 8 French Angio-Seal closure device with hemostasis. The right groin appeared soft. Distal pulses were Dopplerable in the left foot. On the right the posterior tibial artery was  Dopplerable. However, the right foot was warm without evidence of  discoloration. The Dyna CT of the brain demonstrated no evidence of hemorrhage, mass effect or midline shift. Patient was left intubated on account of suspected aspiration at the time of intubation. He was then transferred to the PACU and then neuro ICU for post revascularization management. IMPRESSION: Status post endovascular complete revascularization of the right middle cerebral artery M1 segment with 1 pass with the Tiger 17 retrieval device with aspiration achieving a TICI 3 revascularization. PLAN: As per referring MD. Electronically Signed   By: Julieanne Cotton M.D.   On: 03/13/2020 09:35   DG CHEST PORT 1 VIEW  Result Date: 03/18/2020 CLINICAL DATA:  CVA, cardiomegaly EXAM: PORTABLE CHEST 1 VIEW COMPARISON:  03/15/2020 chest radiograph. FINDINGS: Enteric tube enters stomach with the tip not seen on this image. Right PICC terminates over the lower third of the SVC. Stable cardiomediastinal silhouette with mild cardiomegaly. No pneumothorax. No pleural effusion. No pulmonary edema. Stable minimal bibasilar atelectasis. IMPRESSION: 1. Stable mild cardiomegaly without pulmonary edema. 2. Stable minimal bibasilar atelectasis. Electronically Signed   By: Delbert Phenix M.D.   On: 03/18/2020 09:51   DG CHEST PORT 1 VIEW  Result Date: 03/15/2020 CLINICAL DATA:  Acute respiratory failure EXAM: PORTABLE CHEST 1 VIEW COMPARISON:  03/12/2020 FINDINGS: Interval endotracheal extubation. There is improved aeration of the lungs without acute airspace opacity. Cardiomegaly. IMPRESSION: 1.  Interval endotracheal extubation. 2. There is improved aeration of the lungs without acute airspace opacity. 3.  Cardiomegaly. Electronically Signed   By: Lauralyn Primes M.D.   On: 03/15/2020 08:19   DG Chest Port 1 View  Result Date: 03/12/2020 CLINICAL DATA:  Abnormal respiration. EXAM: PORTABLE CHEST 1 VIEW COMPARISON:  Apr 10, 2020 and prior FINDINGS:  Slight interval retraction of ETT with tip approximately 2.5 cm above the level of the clavicles. Grossly unchanged esophageal temperature probe. No pneumothorax or pleural effusion. Increased patchy right basilar opacities. Cardiomegaly. IMPRESSION: Increased bibasilar opacities. Slight interval retraction of ETT. Electronically Signed   By: Stana Bunting M.D.   On: 03/12/2020 06:43   DG Chest Port 1 View  Result Date: 04/10/20 CLINICAL DATA:  87 year old male with stroke. EXAM: PORTABLE CHEST 1 VIEW COMPARISON:  08/17/2019 FINDINGS: Unchanged cardiomegaly. Interval insertion of endotracheal tube with the tip in the midthoracic trachea. Esophageal temperature probe is positioned in the upper third of the thorax. Bibasilar subsegmental atelectasis. No significant pleural effusion or pneumothorax. No acute osseous abnormality. IMPRESSION: 1. Endotracheal tube distal tip in the midthoracic trachea. 2. Bibasilar subsegmental atelectasis. Electronically Signed   By: Marliss Coots MD  On: 03/23/2020 14:13   DG Abd Portable 1V  Result Date: 03/13/2020 CLINICAL DATA:  Orogastric tube placement EXAM: PORTABLE ABDOMEN - 1 VIEW COMPARISON:  None. FINDINGS: Orogastric tube tip and side port are in the distal stomach with the tip near the pylorus. There is no bowel dilatation air-fluid level to suggest bowel obstruction. No free air. There is bibasilar atelectasis with small pleural effusions. There are staghorn calculi in the left kidney, stable. IMPRESSION: Orogastric tube tip and side port in distal stomach. No bowel obstruction or free air evident. Apparent staghorn calculi in left kidney, stable. Small pleural effusions bilaterally with bibasilar atelectasis. Electronically Signed   By: Bretta Bang III M.D.   On: 03/13/2020 07:52   DG Abd Portable 1V  Result Date: 23-Mar-2020 CLINICAL DATA:  OG tube placement. EXAM: PORTABLE ABDOMEN - 1 VIEW COMPARISON:  Chest radiograph earlier today.  FINDINGS: Enteric tube is in place with tip in the region of the distal esophagus, side-port in the mid thorax. Recommend advancement of greater than 10 cm for optimal placement. Nonobstructive bowel gas pattern. There is excreted IV contrast in the renal collecting systems. IMPRESSION: Enteric tube with tip in the distal esophagus, side-port in the mid esophagus. Recommend advancement of greater than 10 cm for optimal placement. Electronically Signed   By: Narda Rutherford M.D.   On: 03-23-20 22:48   ECHOCARDIOGRAM COMPLETE  Result Date: 03-23-20    ECHOCARDIOGRAM REPORT   Patient Name:   Jeffrey Ashley Date of Exam: 23-Mar-2020 Medical Rec #:  [id number]         Height:       72.0 in Accession #:    [id number]        Weight:       183.0 lb Date of Birth:  1927/02/11         BSA:          2.052 m Patient Age:    89 years          BP:           134/69 mmHg Patient Gender: M                 HR:           76 bpm. Exam Location:  Inpatient Procedure: 2D Echo, Cardiac Doppler and Color Doppler Indications:    Stroke  History:        Patient has no prior history of Echocardiogram examinations.                 Arrythmias:Atrial Fibrillation; Risk Factors:Hypertension.  Sonographer:    Ross Ludwig RDCS (AE) Referring Phys: [phone] MCNEILL P KIRKPATRICK  Sonographer Comments: Echo performed with patient supine and on artificial respirator. IMPRESSIONS  1. Left ventricular ejection fraction, by estimation, is 50 to 55%. The left ventricle has low normal function. The left ventricle has no regional wall motion abnormalities. There is severe concentric left ventricular hypertrophy. Left ventricular diastolic parameters are indeterminate.  2. Right ventricular systolic function is normal. The right ventricular size is normal.  3. Left atrial size was severely dilated.  4. Right atrial size was severely dilated.  5. A small pericardial effusion is present. The pericardial effusion is surrounding the apex.  6. The mitral  valve is grossly normal. Trivial mitral valve regurgitation.  7. The aortic valve is tricuspid. There is mild calcification of the aortic valve. Aortic valve regurgitation is mild. No aortic stenosis is present.  8. The inferior  vena cava is normal in size with greater than 50% respiratory variability, suggesting right atrial pressure of 3 mmHg. Comparison(s): No prior Echocardiogram. FINDINGS  Left Ventricle: Left ventricular ejection fraction, by estimation, is 50 to 55%. The left ventricle has low normal function. The left ventricle has no regional wall motion abnormalities. The left ventricular internal cavity size was normal in size. There is severe concentric left ventricular hypertrophy. Left ventricular diastolic parameters are indeterminate. Right Ventricle: The right ventricular size is normal. No increase in right ventricular wall thickness. Right ventricular systolic function is normal. Left Atrium: Left atrial size was severely dilated. Right Atrium: Right atrial size was severely dilated. Pericardium: A small pericardial effusion is present. The pericardial effusion is surrounding the apex. Mitral Valve: The mitral valve is grossly normal. Trivial mitral valve regurgitation. Tricuspid Valve: The tricuspid valve is normal in structure. Tricuspid valve regurgitation is mild. Aortic Valve: The aortic valve is tricuspid. There is mild calcification of the aortic valve. Aortic valve regurgitation is mild. Aortic regurgitation PHT measures 806 msec. No aortic stenosis is present. Aortic valve mean gradient measures 1.7 mmHg. Aortic valve peak gradient measures 2.9 mmHg. Aortic valve area, by VTI measures 2.01 cm. Pulmonic Valve: The pulmonic valve was normal in structure. Pulmonic valve regurgitation is trivial. Aorta: The aortic root and ascending aorta are structurally normal, with no evidence of dilitation. Venous: The inferior vena cava is normal in size with greater than 50% respiratory variability,  suggesting right atrial pressure of 3 mmHg. IAS/Shunts: The atrial septum is grossly normal.  LEFT VENTRICLE PLAX 2D LVIDd:         4.20 cm LVIDs:         3.00 cm LV PW:         2.10 cm LV IVS:        1.70 cm LVOT diam:     1.90 cm LV SV:         37 LV SV Index:   18 LVOT Area:     2.84 cm  RIGHT VENTRICLE RV Basal diam:  3.40 cm RV S prime:     9.02 cm/s TAPSE (M-mode): 1.8 cm LEFT ATRIUM              Index       RIGHT ATRIUM           Index LA diam:        5.00 cm  2.44 cm/m  RA Area:     27.20 cm LA Vol (A2C):   125.0 ml 60.93 ml/m RA Volume:   81.20 ml  39.58 ml/m LA Vol (A4C):   101.0 ml 49.23 ml/m LA Biplane Vol: 114.0 ml 55.57 ml/m  AORTIC VALVE AV Area (Vmax):    2.01 cm AV Area (Vmean):   1.82 cm AV Area (VTI):     2.01 cm AV Vmax:           84.47 cm/s AV Vmean:          61.033 cm/s AV VTI:            0.185 m AV Peak Grad:      2.9 mmHg AV Mean Grad:      1.7 mmHg LVOT Vmax:         59.87 cm/s LVOT Vmean:        39.133 cm/s LVOT VTI:          0.131 m LVOT/AV VTI ratio: 0.71 AI PHT:  806 msec  AORTA Ao Root diam: 3.60 cm Ao Asc diam:  3.50 cm TRICUSPID VALVE TR Peak grad:   25.4 mmHg TR Vmax:        252.00 cm/s  SHUNTS Systemic VTI:  0.13 m Systemic Diam: 1.90 cm Riley Lam MD Electronically signed by Riley Lam MD Signature Date/Time: 03/10/2020/4:41:09 PM    Final    IR PERCUTANEOUS ART THROMBECTOMY/INFUSION INTRACRANIAL INC DIAG ANGIO  Result Date: 03/14/2020 INDICATION: New onset of left-sided weakness, right gaze deviation and severe dysarthria. 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 EXAM: 1. EMERGENT LARGE VESSEL OCCLUSION THROMBOLYSIS (anterior CIRCULATION) COMPARISON:  CT angiogram of the head and neck March 11, 2020. MEDICATIONS: Ancef 2 g IV antibiotic was administered within 1 hour of the procedure. ANESTHESIA/SEDATION: General anesthesia CONTRAST:  Isovue 300 approximately 50 mL FLUOROSCOPY TIME:   Fluoroscopy Time: 25 minutes 12 seconds (1204 mGy). COMPLICATIONS: None immediate. TECHNIQUE: Following a full explanation of the procedure along with the potential associated complications, an informed witnessed consent was obtained from the patient's son. The risks of intracranial hemorrhage of 10%, worsening neurological deficit, ventilator dependency, death and inability to revascularize were all reviewed in detail with the patient's son. The patient was then put under general anesthesia by the Department of Anesthesiology at [facility]. The right groin was prepped and draped in the usual sterile fashion. Thereafter using modified Seldinger technique, transfemoral access into the right common femoral artery was obtained without difficulty. Over a 0.035 inch guidewire an 8 French 25 Pinnacle sheath was inserted. Through this, and also over a 0.035 inch guidewire a 5 [place] JB 1 catheter was advanced to the aortic arch region and selectively positioned in the right common carotid artery. FINDINGS: The right common carotid arteriogram demonstrates the right external carotid artery and its major branches to be widely patent. The right internal carotid artery at the bulb has approximately 30-40% stenosis by the NASCET criteria due to a smooth atherosclerotic plaque along the posterior wall without evidence of ulcerations. More distally the vessel is seen to opacify to the cranial skull base. The petrous, the cavernous and the supraclinoid segments are widely patent. The right posterior communicating artery is seen opacifying the right posterior cerebral artery distribution. The right anterior cerebral artery opacifies into the capillary and venous phases. Moderate severe narrowing of the right anterior cerebral artery proximal pericallosal artery is seen. 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 PROCEDURE: A diagnostic JB 1 catheter in the right common carotid artery was  exchanged over a 0.035 inch 300 cm Rosen exchange guidewire for an 087 balloon guide which had been prepped with 50% contrast and 50% heparinized saline infusion advanced to the distal right internal carotid artery at the cranial skull base. Guidewire was removed. Good aspiration obtained from the hub of the balloon guide catheter. A gentle control arteriogram performed through this demonstrated no evidence of spasms, dissections or of intraluminal filling defects. Over a 0.014 inch standard Synchro micro guidewire with a J configuration, a combination of a Headway 21-2 tip microcatheter inside of an 071 136 cm Zoom aspiration catheter was advanced to the supraclinoid right ICA. The micro guidewire was then advanced using a torque device through the occluded right middle cerebral artery to the inferior division M2 M3 region followed by the microcatheter. The Zoom catheter was advanced to the supraclinoid right ICA. The guidewire was removed. Good aspiration obtained from  the hub of the microcatheter. A gentle control arteriogram performed through this demonstrates safe position of the tip of the microcatheter. This was then connected to continuous heparinized saline infusion. A Tiger 17 retrieval device was then prepped and purged in heparinized saline infusion. This was then advanced in a coaxial manner with constant heparinized saline infusion to the distal end of the microcatheter. The O ring on the delivery microcatheter was loosened. With slight forward gentle traction with the right hand on the delivery micro guidewire with the left hand the delivery catheter was retrieved unsheathing the retrieval device. The proximal portion of the retrieval device was engaged just inside the proximal aspect of the occluded right middle cerebral artery. Thereafter, the device was expanded and decreased in the proximal right M1 segment for about 2 minutes. Aspiration catheter was then advanced to just inside proximal  occlusion. With constant aspiration being applied the hub of the aspiration catheter using a Penumbra aspiration device, and with a 60 mL syringe at the hub of the balloon catheter with proximal flow arrest for approximately 2 minutes, the combination was retrieved and removed. Upon retrieval, a prominent clot was seen in the retrieval device. Flow arrest was then reversed. A control arteriogram performed through the balloon guide catheter in the right internal carotid artery now demonstrated complete angiographic revascularization of the right middle cerebral artery. Patency of the right anterior cerebral artery and the right posterior communicating artery with subsequent flow into the right posterior cerebral artery was maintained. Mild spasm noted in the dominant M2 branch which responded to 50 mcg of intra arterial nitroglycerin. A final control arteriogram performed with the balloon catheter in the right common carotid artery demonstrated no change in the extracranial or intracranial internal carotid artery. A TICI 3 revascularization was maintained in the right middle and the right anterior cerebral artery distributions. Throughout the procedure, the patient's blood pressure and neurological status remained stable. No evidence of extravasation, mass-effect or midline shift was seen. Balloon guide was then removed. The 8 French Pinnacle sheath was removed and replaced with an 8 French Angio-Seal closure device with hemostasis. The right groin appeared soft. Distal pulses were Dopplerable in the left foot. On the right the posterior tibial artery was Dopplerable. However, the right foot was warm without evidence of  discoloration. The Dyna CT of the brain demonstrated no evidence of hemorrhage, mass effect or midline shift. Patient was left intubated on account of suspected aspiration at the time of intubation. He was then transferred to the PACU and then neuro ICU for post revascularization management.  IMPRESSION: Status post endovascular complete revascularization of the right middle cerebral artery M1 segment with 1 pass with the Tiger 17 retrieval device with aspiration achieving a TICI 3 revascularization. PLAN: As per referring MD. Electronically Signed   By: Julieanne Cotton M.D.   On: 03/13/2020 09:35   CT HEAD CODE STROKE WO CONTRAST  Result Date: March 26, 2020 CLINICAL DATA:  Code stroke.  Neuro deficit, acute, stroke suspected EXAM: CT HEAD WITHOUT CONTRAST TECHNIQUE: Contiguous axial images were obtained from the base of the skull through the vertex without intravenous contrast. COMPARISON:  08/17/2019 and prior FINDINGS: Brain: No acute infarct or intracranial hemorrhage. No mass lesion. No midline shift, ventriculomegaly or extra-axial fluid collection. Mild cerebral atrophy with ex vacuo dilatation. Chronic microvascular ischemic changes. Chronic hypodensities involving the right basal ganglia, posterior limb of the right internal capsule and left insula, remote lacunar insult versus dilated perivascular spaces. Vascular: No hyperdense vessel  or unexpected calcification. Skull: No acute finding.  Prior right frontoparietal burr holes. Sinuses/Orbits: No acute orbital finding. Moderate ethmoid and left maxillary sinus disease. Other: None. ASPECTS [facility] Stroke Program Early CT Score) - Ganglionic level infarction (caudate, lentiform nuclei, internal capsule, insula, M1-M3 cortex): 7 - Supraganglionic infarction (M4-M6 cortex): 3 Total score (0-10 with 10 being normal): 10 IMPRESSION: 1. No acute intracranial process. Mild cerebral atrophy and chronic microvascular ischemic changes. 2. ASPECTS is 10 3. Remote lacunar insult versus dilated perivascular spaces involving the right basal ganglia, posterior limb of the right internal capsule and left insula. Code stroke imaging results were communicated on 12-Mar-2020 at 9:39 am to provider Dr. Amada Jupiter via secure text paging. Electronically Signed    By: Stana Bunting M.D.   On: 2020/03/12 09:41    Microbiology: No results found for this or any previous visit (from the past 240 hour(s)).   Labs: Basic Metabolic Panel: Recent Labs  Lab 03/20/20 0313 03/21/20 0152 03/22/20 0237 03/23/20 0254  NA 134* 136 135 137  K 4.8 4.6 4.5 4.3  CL 99 101 100 98  CO2 GLUCOSE 168* 160* 156* 159*  BUN 32* 30* 38* 35*  CREATININE 0.97 0.90 0.90 0.94  CALCIUM 8.6* 8.7* 8.5* 8.8*  MG  --  2.0 2.0 2.0   Liver Function Tests: No results for input(s): AST, ALT, ALKPHOS, BILITOT, PROT, ALBUMIN in the last 168 hours. No results for input(s): LIPASE, AMYLASE in the last 168 hours. No results for input(s): AMMONIA in the last 168 hours. CBC: Recent Labs  Lab 03/20/20 0313 03/21/20 0152 03/22/20 0237 03/23/20 0254  WBC 11.3* 9.1 9.0 9.5  NEUTROABS  --  6.7 6.6 6.5  HGB 11.7* 12.2* 11.7* 11.6*  HCT 36.3* 36.0* 34.9* 36.7*  MCV 92.1 91.8 92.1 94.3  PLT 195 194 184 246   Cardiac Enzymes: No results for input(s): CKTOTAL, CKMB, CKMBINDEX, TROPONINI in the last 168 hours. D-Dimer No results for input(s): DDIMER in the last 72 hours. BNP: Invalid input(s): POCBNP CBG: Recent Labs  Lab 03/24/20 2352 03/25/20 0408 03/25/20 0729 03/25/20 1156 03/25/20 1544  GLUCAP 139* 158* 156* 183* 186*   Anemia work up No results for input(s): VITAMINB12, FOLATE, FERRITIN, TIBC, IRON, RETICCTPCT in the last 72 hours. Urinalysis    Component Value Date/Time   COLORURINE AMBER (A) 07/07/2016 1335   APPEARANCEUR Hazy (A) 01/26/2020 1025   LABSPEC 1.025 07/07/2016 1335   LABSPEC 1.019 03/13/2012 1900   PHURINE 5.0 07/07/2016 1335   GLUCOSEU Negative 01/26/2020 1025   GLUCOSEU Negative 03/13/2012 1900   HGBUR NEGATIVE 07/07/2016 1335   BILIRUBINUR Negative 01/26/2020 1025   BILIRUBINUR Negative 03/13/2012 1900   KETONESUR 20 (A) 07/07/2016 1335   PROTEINUR Negative 01/26/2020 1025   PROTEINUR 30 (A) 07/07/2016 1335    NITRITE Negative 01/26/2020 1025   NITRITE NEGATIVE 07/07/2016 1335   LEUKOCYTESUR Negative 01/26/2020 1025   LEUKOCYTESUR 1+ 03/13/2012 1900   Sepsis Labs Invalid input(s): PROCALCITONIN,  WBC,  LACTICIDVEN     SIGNED:  Lewie Chamber, MD  [facility] 03/14/2020, 12:56 PM

## 2020-04-01 NOTE — Progress Notes (Signed)
Pt noted to have no auscultated apical pulse, no respirations in 2 minutes, and no palpable carotid pulse at 0310. Delice Bison Consulting civil engineer also witnessed the same. Time of death 8. Paged Dr Camillo Flaming on call at 0315 to notify, was acknowledged via epic chat within minutes. Death certificate to be signed electronically. Called son Renae Fickle at [phone] to notify, he is six hours away but will attempt to call family to notify in case they would like to come to the floor. 0938 notified Robbie Lis donors. 0340 received call back from [place], who was unable to reach brother who is in [place] x2 but "he saw him the day before Christmas and I think he will be okay. I am thinking it will be some time before he could get up there anyway even if I let him know now, so it's okay to move Dad to the morgue now." Renae Fickle will call this morning around 0700 as he is aware the patient did have arrangements to be cremated but is not quite sure which funeral home will be doing this. Post mortem flowsheet completed and patient placement notified at 0353.

## 2020-04-01 NOTE — Progress Notes (Signed)
PRN dilaudid given for increased respiratory rate/effort at this time.

## 2020-04-01 DEATH — deceased

## 2020-05-09 ENCOUNTER — Ambulatory Visit: Payer: Medicare Other | Admitting: Internal Medicine

## 2020-07-26 ENCOUNTER — Ambulatory Visit: Payer: Self-pay | Admitting: Urology
